# Patient Record
Sex: Male | Born: 1960 | Race: White | Hispanic: No | Marital: Married | Smoking: Former smoker
Health system: Southern US, Community
[De-identification: ages and names within clinical notes are randomized; demographics above are authoritative.]

## PROBLEM LIST (undated history)

## (undated) DIAGNOSIS — Z86010 Personal history of colonic polyps: Secondary | ICD-10-CM

## (undated) DIAGNOSIS — N50819 Testicular pain, unspecified: Secondary | ICD-10-CM

## (undated) DIAGNOSIS — K219 Gastro-esophageal reflux disease without esophagitis: Secondary | ICD-10-CM

## (undated) DIAGNOSIS — N503 Cyst of epididymis: Secondary | ICD-10-CM

## (undated) DIAGNOSIS — M7500 Adhesive capsulitis of unspecified shoulder: Secondary | ICD-10-CM

## (undated) DIAGNOSIS — Z9189 Other specified personal risk factors, not elsewhere classified: Secondary | ICD-10-CM

## (undated) DIAGNOSIS — R011 Cardiac murmur, unspecified: Secondary | ICD-10-CM

## (undated) DIAGNOSIS — Z9289 Personal history of other medical treatment: Secondary | ICD-10-CM

## (undated) DIAGNOSIS — I714 Abdominal aortic aneurysm, without rupture, unspecified: Secondary | ICD-10-CM

## (undated) DIAGNOSIS — R739 Hyperglycemia, unspecified: Secondary | ICD-10-CM

## (undated) DIAGNOSIS — N529 Male erectile dysfunction, unspecified: Secondary | ICD-10-CM

## (undated) DIAGNOSIS — T7840XA Allergy, unspecified, initial encounter: Secondary | ICD-10-CM

## (undated) DIAGNOSIS — I712 Thoracic aortic aneurysm, without rupture: Secondary | ICD-10-CM

## (undated) DIAGNOSIS — E291 Testicular hypofunction: Secondary | ICD-10-CM

## (undated) DIAGNOSIS — N200 Calculus of kidney: Secondary | ICD-10-CM

## (undated) DIAGNOSIS — E785 Hyperlipidemia, unspecified: Secondary | ICD-10-CM

## (undated) DIAGNOSIS — K625 Hemorrhage of anus and rectum: Secondary | ICD-10-CM

## (undated) DIAGNOSIS — I861 Scrotal varices: Secondary | ICD-10-CM

## (undated) HISTORY — DX: Testicular pain, unspecified: N50.819

## (undated) HISTORY — DX: Hyperglycemia, unspecified: R73.9

## (undated) HISTORY — PX: POLYPECTOMY: SHX149

## (undated) HISTORY — DX: Hyperlipidemia, unspecified: E78.5

## (undated) HISTORY — DX: Testicular hypofunction: E29.1

## (undated) HISTORY — DX: Hemorrhage of anus and rectum: K62.5

## (undated) HISTORY — DX: Personal history of colonic polyps: Z86.010

## (undated) HISTORY — DX: Thoracic aortic aneurysm, without rupture: I71.2

## (undated) HISTORY — DX: Scrotal varices: I86.1

## (undated) HISTORY — DX: Allergy, unspecified, initial encounter: T78.40XA

## (undated) HISTORY — DX: Adhesive capsulitis of unspecified shoulder: M75.00

## (undated) HISTORY — DX: Cardiac murmur, unspecified: R01.1

## (undated) HISTORY — DX: Male erectile dysfunction, unspecified: N52.9

## (undated) HISTORY — PX: COLONOSCOPY: SHX174

---

## 1992-06-25 HISTORY — PX: REFRACTIVE SURGERY: SHX103

## 2000-10-26 ENCOUNTER — Emergency Department (HOSPITAL_COMMUNITY): Admission: EM | Admit: 2000-10-26 | Discharge: 2000-10-26 | Payer: Self-pay | Admitting: Emergency Medicine

## 2001-11-25 ENCOUNTER — Ambulatory Visit (HOSPITAL_BASED_OUTPATIENT_CLINIC_OR_DEPARTMENT_OTHER): Admission: RE | Admit: 2001-11-25 | Discharge: 2001-11-25 | Payer: Self-pay | Admitting: General Surgery

## 2002-08-03 ENCOUNTER — Encounter: Payer: Self-pay | Admitting: Otolaryngology

## 2002-08-03 ENCOUNTER — Encounter: Admission: RE | Admit: 2002-08-03 | Discharge: 2002-08-03 | Payer: Self-pay | Admitting: Otolaryngology

## 2002-09-21 ENCOUNTER — Encounter (INDEPENDENT_AMBULATORY_CARE_PROVIDER_SITE_OTHER): Payer: Self-pay | Admitting: *Deleted

## 2002-09-21 ENCOUNTER — Ambulatory Visit (HOSPITAL_BASED_OUTPATIENT_CLINIC_OR_DEPARTMENT_OTHER): Admission: RE | Admit: 2002-09-21 | Discharge: 2002-09-21 | Payer: Self-pay | Admitting: Otolaryngology

## 2002-09-21 HISTORY — PX: NASAL SEPTUM SURGERY: SHX37

## 2002-11-20 ENCOUNTER — Encounter: Payer: Self-pay | Admitting: Family Medicine

## 2002-11-20 ENCOUNTER — Encounter: Admission: RE | Admit: 2002-11-20 | Discharge: 2002-11-20 | Payer: Self-pay | Admitting: Family Medicine

## 2002-12-01 ENCOUNTER — Encounter: Admission: RE | Admit: 2002-12-01 | Discharge: 2002-12-01 | Payer: Self-pay | Admitting: Family Medicine

## 2002-12-01 ENCOUNTER — Encounter: Payer: Self-pay | Admitting: Family Medicine

## 2004-03-14 ENCOUNTER — Ambulatory Visit: Admission: RE | Admit: 2004-03-14 | Discharge: 2004-03-14 | Payer: Self-pay | Admitting: Orthopedic Surgery

## 2005-12-17 ENCOUNTER — Encounter: Admission: RE | Admit: 2005-12-17 | Discharge: 2006-03-12 | Payer: Self-pay | Admitting: Chiropractic Medicine

## 2006-01-08 ENCOUNTER — Encounter: Admission: RE | Admit: 2006-01-08 | Discharge: 2006-01-08 | Payer: Self-pay | Admitting: Sports Medicine

## 2006-01-28 ENCOUNTER — Encounter: Admission: RE | Admit: 2006-01-28 | Discharge: 2006-01-28 | Payer: Self-pay | Admitting: Chiropractic Medicine

## 2006-02-27 ENCOUNTER — Encounter: Admission: RE | Admit: 2006-02-27 | Discharge: 2006-02-27 | Payer: Self-pay | Admitting: Sports Medicine

## 2006-11-11 ENCOUNTER — Encounter: Admission: RE | Admit: 2006-11-11 | Discharge: 2006-11-11 | Payer: Self-pay | Admitting: Neurology

## 2007-02-07 ENCOUNTER — Encounter: Payer: Self-pay | Admitting: Family Medicine

## 2009-02-09 ENCOUNTER — Encounter (INDEPENDENT_AMBULATORY_CARE_PROVIDER_SITE_OTHER): Payer: Self-pay | Admitting: *Deleted

## 2009-02-09 LAB — CONVERTED CEMR LAB
Hemoglobin: 14.2 g/dL
Testosterone, total: 2.2 ng/mL
Total CHOL/HDL Ratio: 6.64

## 2009-08-03 ENCOUNTER — Encounter (INDEPENDENT_AMBULATORY_CARE_PROVIDER_SITE_OTHER): Payer: Self-pay | Admitting: *Deleted

## 2009-08-03 LAB — CONVERTED CEMR LAB
ALT: 57 units/L
AST: 37 units/L
Alkaline Phosphatase: 85 units/L
HDL: 44 mg/dL
Testosterone, total: 2.6 ng/mL
Triglycerides: 251 mg/dL

## 2009-08-23 ENCOUNTER — Encounter (INDEPENDENT_AMBULATORY_CARE_PROVIDER_SITE_OTHER): Payer: Self-pay | Admitting: *Deleted

## 2009-08-23 LAB — CONVERTED CEMR LAB
MCV: 92.9 fL
PSA: 0.4 ng/mL

## 2010-02-13 ENCOUNTER — Ambulatory Visit: Payer: Self-pay | Admitting: Family Medicine

## 2010-02-13 ENCOUNTER — Encounter (INDEPENDENT_AMBULATORY_CARE_PROVIDER_SITE_OTHER): Payer: Self-pay | Admitting: *Deleted

## 2010-02-13 ENCOUNTER — Encounter: Payer: Self-pay | Admitting: Family Medicine

## 2010-02-13 DIAGNOSIS — Z9189 Other specified personal risk factors, not elsewhere classified: Secondary | ICD-10-CM | POA: Insufficient documentation

## 2010-02-13 DIAGNOSIS — R51 Headache: Secondary | ICD-10-CM | POA: Insufficient documentation

## 2010-02-13 DIAGNOSIS — Z8679 Personal history of other diseases of the circulatory system: Secondary | ICD-10-CM | POA: Insufficient documentation

## 2010-02-13 DIAGNOSIS — R519 Headache, unspecified: Secondary | ICD-10-CM | POA: Insufficient documentation

## 2010-02-13 DIAGNOSIS — M722 Plantar fascial fibromatosis: Secondary | ICD-10-CM | POA: Insufficient documentation

## 2010-02-13 DIAGNOSIS — E785 Hyperlipidemia, unspecified: Secondary | ICD-10-CM | POA: Insufficient documentation

## 2010-02-13 DIAGNOSIS — E291 Testicular hypofunction: Secondary | ICD-10-CM | POA: Insufficient documentation

## 2010-02-17 ENCOUNTER — Encounter (INDEPENDENT_AMBULATORY_CARE_PROVIDER_SITE_OTHER): Payer: Self-pay | Admitting: *Deleted

## 2010-02-17 DIAGNOSIS — I861 Scrotal varices: Secondary | ICD-10-CM

## 2010-02-17 DIAGNOSIS — F528 Other sexual dysfunction not due to a substance or known physiological condition: Secondary | ICD-10-CM | POA: Insufficient documentation

## 2010-02-17 HISTORY — DX: Scrotal varices: I86.1

## 2010-02-23 ENCOUNTER — Ambulatory Visit: Payer: Self-pay | Admitting: Family Medicine

## 2010-03-02 ENCOUNTER — Telehealth: Payer: Self-pay | Admitting: Family Medicine

## 2010-03-02 LAB — CONVERTED CEMR LAB
Albumin: 4.5 g/dL (ref 3.5–5.2)
Basophils Absolute: 0.1 10*3/uL (ref 0.0–0.1)
CO2: 27 meq/L (ref 19–32)
Calcium: 9.5 mg/dL (ref 8.4–10.5)
Chloride: 106 meq/L (ref 96–112)
Creatinine, Ser: 0.9 mg/dL (ref 0.4–1.5)
Eosinophils Absolute: 0.2 10*3/uL (ref 0.0–0.7)
Glucose, Bld: 99 mg/dL (ref 70–99)
HCT: 42.9 % (ref 39.0–52.0)
Hemoglobin: 14.6 g/dL (ref 13.0–17.0)
LDL Cholesterol: 98 mg/dL (ref 0–99)
Monocytes Absolute: 0.6 10*3/uL (ref 0.1–1.0)
Monocytes Relative: 7.5 % (ref 3.0–12.0)
PSA: 0.31 ng/mL (ref 0.10–4.00)
Total Bilirubin: 0.6 mg/dL (ref 0.3–1.2)
Total Protein: 7 g/dL (ref 6.0–8.3)
Triglycerides: 131 mg/dL (ref 0.0–149.0)
VLDL: 26.2 mg/dL (ref 0.0–40.0)

## 2010-03-13 ENCOUNTER — Ambulatory Visit (HOSPITAL_BASED_OUTPATIENT_CLINIC_OR_DEPARTMENT_OTHER): Admission: RE | Admit: 2010-03-13 | Discharge: 2010-03-13 | Payer: Self-pay | Admitting: General Surgery

## 2010-03-13 HISTORY — PX: OTHER SURGICAL HISTORY: SHX169

## 2010-03-14 ENCOUNTER — Telehealth: Payer: Self-pay | Admitting: Family Medicine

## 2010-03-16 ENCOUNTER — Encounter (INDEPENDENT_AMBULATORY_CARE_PROVIDER_SITE_OTHER): Payer: Self-pay | Admitting: *Deleted

## 2010-03-20 ENCOUNTER — Ambulatory Visit: Payer: Self-pay | Admitting: Gastroenterology

## 2010-04-03 ENCOUNTER — Ambulatory Visit: Payer: Self-pay | Admitting: Gastroenterology

## 2010-04-03 LAB — HM COLONOSCOPY

## 2010-04-05 ENCOUNTER — Encounter: Payer: Self-pay | Admitting: Gastroenterology

## 2010-04-10 ENCOUNTER — Encounter: Payer: Self-pay | Admitting: Family Medicine

## 2010-05-05 ENCOUNTER — Emergency Department (HOSPITAL_COMMUNITY): Admission: EM | Admit: 2010-05-05 | Discharge: 2010-05-05 | Payer: Self-pay | Admitting: Emergency Medicine

## 2010-05-08 ENCOUNTER — Ambulatory Visit: Payer: Self-pay | Admitting: Family Medicine

## 2010-05-08 DIAGNOSIS — R42 Dizziness and giddiness: Secondary | ICD-10-CM | POA: Insufficient documentation

## 2010-06-16 ENCOUNTER — Encounter: Payer: Self-pay | Admitting: Family Medicine

## 2010-07-25 NOTE — Progress Notes (Signed)
Summary: Rx's Testosterone, needles and syringes  Phone Note Outgoing Call Call back at [phone]   Call placed by: Sydell Axon, LPN Call placed to: [facility] Summary of Call: Called rx's into pharmacy as instructed. Was informed by pharmacist  Trinna Post)  that patient gets a 3 ml syringe and the last time that he got needles he got the 23 gauge.  Advised pharmacist to fill as patient has been getting in the past. Initial call taken by: Sydell Axon LPN,  March 02, 2010 10:49 AM  Follow-up for Phone Call        I agree, please update the med list this.  I appreciate the help Follow-up by: Crawford Givens MD,  March 02, 2010 6:01 PM    New/Updated Medications: MONOJECT SYRINGE 3 ML MISC (SYRINGE (DISPOSABLE)) Use every 4 weeks or as directed BD HYPODERMIC NEEDLE 23G X 1" MISC (NEEDLE (DISP)) Use every 4 weeks or as directed Prescriptions: BD HYPODERMIC NEEDLE 23G X 1" MISC (NEEDLE (DISP)) Use every 4 weeks or as directed  #10 x 1   Entered by:   Sydell Axon LPN   Authorized by:   Crawford Givens MD   Signed by:   Sydell Axon LPN on 91/47/8295   Method used:   Telephoned to ...       [facility]  [address] [id number]       [address]       [place], [place]  [address]       Ph: [phone] or [phone]       Fax: [phone]   RxID:   [id number] MONOJECT SYRINGE 3 ML MISC (SYRINGE (DISPOSABLE)) Use every 4 weeks or as directed  #10 x 1   Entered by:   Sydell Axon LPN   Authorized by:   Crawford Givens MD   Signed by:   Sydell Axon LPN on 29/51/8841   Method used:   Telephoned to ...       [facility]  [address] [id number]       [address]       [place], [place]  [address]       Ph: [phone] or [phone]       Fax: [phone]   RxID:   [id number] DEPO-TESTOSTERONE 200 MG/ML OIL (TESTOSTERONE CYPIONATE) 1 mL q 4 weeks  #10 ml x 1   Entered by:   Sydell Axon LPN   Authorized by:   Crawford Givens MD   Signed  by:   Sydell Axon LPN on 54/62/7035   Method used:   Telephoned to ...       [facility]  [address] [id number]       [address]       [place], [place]  [address]       Ph: [phone] or [phone]       Fax: [phone]   RxID:   [id number]

## 2010-07-25 NOTE — Letter (Signed)
Summary: [facility]  [facility]   Imported By: Lanelle Bal 02/28/2010 09:26:15  _____________________________________________________________________  External Attachment:    Type:   Image     Comment:   External Document

## 2010-07-25 NOTE — Letter (Signed)
Summary: Previsit letter  [facility]  [address] [place], [place] [address]   Phone: [phone]  Fax: [phone]       02/13/2010 MRN: [id number]  Steven [name] [address] [place], [place]  [address]  Dear [name],  Welcome to the Gastroenterology Division at [facility].    You are scheduled to see a nurse for your pre-procedure visit on 03-20-2010 at 8:00am on the 3rd floor at [facility], [address]  We ask that you try to arrive at our office 15 minutes prior to your appointment time to allow for check-in.  Your nurse visit will consist of discussing your medical and surgical history, your immediate family medical history, and your medications.    Please bring a complete list of all your medications or, if you prefer, bring the medication bottles and we will list them.  We will need to be aware of both prescribed and over the counter drugs.  We will need to know exact dosage information as well.  If you are on blood thinners (Coumadin, Plavix, Aggrenox, Ticlid, etc.) please call our office today/prior to your appointment, as we need to consult with your physician about holding your medication.   Please be prepared to read and sign documents such as consent forms, a financial agreement, and acknowledgement forms.  If necessary, and with your consent, a friend or relative is welcome to sit-in on the nurse visit with you.  Please bring your insurance card so that we may make a copy of it.  If your insurance requires a referral to see a specialist, please bring your referral form from your primary care physician.  No co-pay is required for this nurse visit.     If you cannot keep your appointment, please call [phone] to cancel or reschedule prior to your appointment date.  This allows [place] the opportunity to schedule an appointment for another patient in need of care.    Thank you for choosing [facility] for your  medical needs.  We appreciate the opportunity to care for you.  Please visit [place] at our website  to learn more about our practice.                     Sincerely.                                                                                                                   The Gastroenterology Division

## 2010-07-25 NOTE — Letter (Signed)
Summary: [name] Instructions  [facility]  [address] [place], [place] [address]   Phone: [phone]  Fax: [phone]       Steven [name]    07-Dec-1960    MRN: [id number]        Procedure Day /Date:  04/03/10 Monday     Arrival Time:  9:00 am     Procedure Time: 10:00 am     Location of Procedure:                    _ x_  [facility] (4th Floor)                        PREPARATION FOR COLONOSCOPY WITH MOVIPREP   Starting 5 days prior to your procedure _10/5/11 _ do not eat nuts, seeds, popcorn, corn, beans, peas,  salads, or any raw vegetables.  Do not take any fiber supplements (e.g. Metamucil, Citrucel, and Benefiber).  THE DAY BEFORE YOUR PROCEDURE         DATE:  04/02/10   DAY:  Sunday  1.  Drink clear liquids the entire day-NO SOLID FOOD  2.  Do not drink anything colored red or purple.  Avoid juices with pulp.  No orange juice.  3.  Drink at least 64 oz. (8 glasses) of fluid/clear liquids during the day to prevent dehydration and help the prep work efficiently.  CLEAR LIQUIDS INCLUDE: Water Jello Ice Popsicles Tea (sugar ok, no milk/cream) Powdered fruit flavored drinks Coffee (sugar ok, no milk/cream) Gatorade Juice: apple, white grape, white cranberry  Lemonade Clear bullion, consomm, broth Carbonated beverages (any kind) Strained chicken noodle soup Hard Candy                             4.  In the morning, mix first dose of MoviPrep solution:    Empty 1 Pouch A and 1 Pouch B into the disposable container    Add lukewarm drinking water to the top line of the container. Mix to dissolve    Refrigerate (mixed solution should be used within 24 hrs)  5.  Begin drinking the prep at 5:00 p.m. The MoviPrep container is divided by 4 marks.   Every 15 minutes drink the solution down to the next mark (approximately 8 oz) until the full liter is complete.   6.  Follow completed prep with 16 oz of clear liquid of your  choice (Nothing red or purple).  Continue to drink clear liquids until bedtime.  7.  Before going to bed, mix second dose of MoviPrep solution:    Empty 1 Pouch A and 1 Pouch B into the disposable container    Add lukewarm drinking water to the top line of the container. Mix to dissolve    Refrigerate  THE DAY OF YOUR PROCEDURE      DATE:   04/03/10 DAY:  Monday  Beginning at  5:00 a.m. (5 hours before procedure):         1. Every 15 minutes, drink the solution down to the next mark (approx 8 oz) until the full liter is complete.  2. Follow completed prep with 16 oz. of clear liquid of your choice.    3. You may drink clear liquids until  8:00am  (2 HOURS BEFORE PROCEDURE).   MEDICATION INSTRUCTIONS  Unless otherwise instructed, you should take regular prescription medications with a small sip  of water   as early as possible the morning of your procedure.           OTHER INSTRUCTIONS  You will need a responsible adult at least 51 years of age to accompany you and drive you home.   This person must remain in the waiting room during your procedure.  Wear loose fitting clothing that is easily removed.  Leave jewelry and other valuables at home.  However, you may wish to bring a book to read or  an iPod/MP3 player to listen to music as you wait for your procedure to start.  Remove all body piercing jewelry and leave at home.  Total time from sign-in until discharge is approximately 2-3 hours.  You should go home directly after your procedure and rest.  You can resume normal activities the  day after your procedure.  The day of your procedure you should not:   Drive   Make legal decisions   Operate machinery   Drink alcohol   Return to work  You will receive specific instructions about eating, activities and medications before you leave.    The above instructions have been reviewed and explained to me by   Karl Bales RN  March 20, 2010 8:04  AM    I fully understand and can verbalize these instructions _____________________________ Date _________

## 2010-07-25 NOTE — Assessment & Plan Note (Signed)
Summary: cpx/[facility]   Vital Signs:  Patient profile:   50 year old male Height:      74 inches Weight:      242.75 pounds BMI:     31.28 Temp:     97.8 degrees F oral Pulse rate:   80 / minute Pulse rhythm:   regular BP sitting:   110 / 64  (left arm) Cuff size:   large  Vitals Entered By: Delilah Shan CMA Bristyn Kulesza Dull) (February 13, 2010 11:36 AM) CC: CPX - Transfer from [place]   History of Present Illness: CPE- See prev med.  H/o sebaceous cysts on back.  Has appointment with [facility] today for eval and tx.   R>L foot pain, at the heel, starts after getting out of bed. Better after being up and weightbearing for a period of time.   Elevated Cholesterol: Using medications without problems:yes Muscle aches: no Other complaints: no   H/o low T noted on labs.  Also with h/o ED and fatigue.  Fatigue has improved since started testosterone but ED not resolved.  Next dose of testosterone due 02/27/10.  That would be 6th dose total.  Will be due for labs soon.  Preventive Screening-Counseling & Management  Alcohol-Tobacco     Smoking Status: quit  Caffeine-Diet-Exercise     Does Patient Exercise: yes      Drug Use:  no.    Allergies (verified): 1)  ! Penicillin 2)  ! Amoxicillin 3)  ! 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 Family History Diabetes 1st degree relative, other blood relative Family History Kidney disease, parents M alive, treated renal cancer 11-13-1991 F dead, parkinson's disease/alzheimer's  Social History: Reviewed history and no changes required. 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  Smoking Status:  quit Drug Use:  no Does Patient Exercise:  yes  Review of Systems       See HPI.  Otherwise negative.    Physical Exam  General:  GEN: nad, alert and oriented HEENT: mucous membranes moist NECK: supple w/o LA CV: rrr.  no murmur PULM: ctab, no inc wob ABD: soft, +bs EXT: no edema SKIN: 3 sebaceous cysts on back, 1 irriated (it has prev drained)  R ankle wnl but foot minimally tender to palpation at origin on plantar fascia.  O/w NV intact.  Rectal:  No external abnormalities noted. Normal sphincter tone. No rectal masses or tenderness. Prostate:  Prostate gland firm and smooth, minimal enlargement, no nodularity, tenderness, mass, asymmetry or induration.   Impression & Recommendations:  Problem # 1:  Preventive Health Care (ICD-V70.0) See prev med.  Td should be up to date based on patient recollection.  Records to be scanned.  Problem # 2:  HYPERLIPIDEMIA (ICD-272.4) No change in meds. Return for labs. His updated medication list for this problem includes:    Lipitor 20 Mg Tabs (Atorvastatin calcium) .Marland Kitchen... Take 1 tablet by mouth once a day  Problem # 3:  HYPOGONADISM (ICD-257.2) Return for labs per instructions.   Problem # 4:  PLANTAR FASCIITIS (ICD-728.71) D/w patient [id number] and OTC inserts for arch.  Fu as needed.   Complete Medication List: 1)  Lipitor 20 Mg Tabs  (Atorvastatin calcium) .... Take 1 tablet by mouth once a day 2)  Aspirin 81 Mg Tabs (Aspirin) .... Take 1 tablet by mouth once a day 3)  Depo-testosterone 200 Mg/ml Oil (Testosterone cypionate) .Marland Kitchen.. 1 ml q 4 weeks  Other Orders: Gastroenterology Referral (GI)  Patient Instructions: 1)  I want you to come back for fasting AM labs on 9/1 or 9/2.   2)  cmet   272.0 3)  lipid    272.0 4)  testosterone   257.2 5)  cbc   257.2 6)  PSA   257.2 7)  We'll contact you with your lab report.  8)  See Shirlee Limerick about your referral before your leave today.   9)  Follow up with [facility] today about your back.   Current Allergies (reviewed today): ! PENICILLIN ! AMOXICILLIN ! ERYTHROMYCIN   Prevention & Chronic Care Immunizations   Influenza vaccine: Not documented   Influenza vaccine due: 02/23/2010    Tetanus booster: Not documented    Pneumococcal vaccine: Not documented  Colorectal Screening   Hemoccult: Not documented    Colonoscopy: Not documented  Other Screening   PSA: Not documented   PSA action/deferral: Discussed-PSA requested  (02/13/2010)   Smoking status: quit  (02/13/2010)  Lipids   Total Cholesterol: Not documented   LDL: Not documented   LDL Direct: Not documented   HDL: Not documented   Triglycerides: Not documented    SGOT (AST): Not documented   SGPT (ALT): Not documented   Alkaline phosphatase: Not documented   Total bilirubin: Not documented    Lipid flowsheet reviewed?: Yes  Self-Management Support :    Lipid self-management support: Not documented    Appended Document: cpx/transfer from [facility] Stool heme neg today.

## 2010-07-25 NOTE — Letter (Signed)
Summary: Results Letter  [facility]  [address] [place], [place] [address]   Phone: [phone]  Fax: [phone]        April 05, 2010 MRN: [id number]    Steven [name] [address] [place], [place]  [address]    Dear Mr. Dingus,   Three of the polyps removed during your recent procedure were proven to be adenomatous.  These are pre-cancerous polyps that may have grown into cancers if they had not been removed.  Based on current nationally recognized surveillance guidelines, I recommend that you have a repeat colonoscopy in 3 years.   We will therefore put your information in our reminder system and will contact you in 3 years to schedule a repeat procedure.  Please call if you have any questions or concerns.       Sincerely,  Rachael Fee MD  This letter has been electronically signed by your physician.  Appended Document: Results Letter letter mailed

## 2010-07-25 NOTE — Miscellaneous (Signed)
  Clinical Lists Changes  Observations: Added new observation of TD BOOSTER: Td (02/24/2004 14:22)      Immunization History:  Tetanus/Td Immunization History:    Tetanus/Td:  td (02/24/2004)

## 2010-07-25 NOTE — Assessment & Plan Note (Signed)
Summary: ?SINUS INFECTION/CLE   Vital Signs:  Patient profile:   50 year old male Height:      74 inches Weight:      250.75 pounds BMI:     32.31 Temp:     98.6 degrees F oral Pulse rate:   112 / minute Pulse rhythm:   regular BP sitting:   130 / 74  (left arm) Cuff size:   large  Vitals Entered By: Delilah Shan CMA Duncan Dull) (May 08, 2010 2:29 PM) CC: ? sinus infection   History of Present Illness: Thursday night, Friday AM woke up dizzy.  Positional changes on Friday AM.  Went to ER wtih vertigo and nausea.  Had some tingling in L arm.  Meclizine helps some with nausea but not with the vertigo.  MRI records reviewed with patient along with ER recs.  Still with some mild vertigo, positional, improving.  Still with some occ sweats.  Otherwise feeling well.    Likely motion/artifact on MRI.  Even If he did have disease in MCA vessels (which is unlikely given that other vessels wnl), goal would be further prevention with statin, controlled BP, not smoking, all of which have already been done.  On ASA and no h/o DM2.  D/w patient and he understood.   Allergies: 1)  ! Penicillin 2)  ! Amoxicillin 3)  ! Erythromycin  Review of Systems       See HPI.  Otherwise negative.    Physical Exam  General:  GEN: nad, alert and oriented HEENT: mucous membranes moist, TM w/o erythema, nasal epithelium injected, OP with cobblestoning, no exudates, PERRL, EOMI NECK: supple w/o LA CV: rrr. murmur noted, no change from prev PULM: ctab, no inc wob ABD: soft, +bs EXT: no edema CN 2-12 wnl B, S/S/DTR wnl x4, mild vertigo symptoms with rapid head turning but not with eye tracking    Impression & Recommendations:  Problem # 1:  INTERMITTENT VERTIGO (ICD-780.4) LIkey related to ongoing URI, likely viral.  Vertigo is improving per patient.  I would just observe.  If symptoms persist, patient to notify me.  If vertigo persists without other symptoms, consider neuro referral.  I expect this to  improve on its own. Nontoxic and okay for outpatient follow up.   Complete Medication List: 1)  Lipitor 20 Mg Tabs (Atorvastatin calcium) .... Take 1 tablet by mouth once a day 2)  Aspirin 81 Mg Tabs (Aspirin) .... Take 1 tablet by mouth once a day 3)  Depo-testosterone 200 Mg/ml Oil (Testosterone cypionate) .... 1.25 ml q 4 weeks 4)  Monoject Syringe 3 Ml Misc (Syringe (disposable)) .... Use every 4 weeks or as directed 5)  Bd Hypodermic Needle 23g X 1" Misc (Needle (disp)) .... Use every 4 weeks or as directed  Patient Instructions: 1)  I think your vertigo is due to a viral infection.  I would rest for the next few days and it should get better on its own.  If you aren't improving, let me know.  2)  Call back to set up your lab visit for the testosterone in late March or early April 2012.  Take care.    Orders Added: 1)  Est. Patient Level III [[address]]    Current Allergies (reviewed today): ! PENICILLIN ! AMOXICILLIN ! ERYTHROMYCIN

## 2010-07-25 NOTE — Progress Notes (Signed)
Summary: prior Berkley Harvey is needed for testosterone  Phone Note From Pharmacy   [facility]  [address] [id number] [employer] Summary of Call: Prior Berkley Harvey is needed for testosterone, form is on your desk. Initial call taken by: Lowella Petties CMA,  March 14, 2010 4:34 PM  Follow-up for Phone Call        signed.  Follow-up by: Crawford Givens MD,  March 15, 2010 12:07 PM  Additional Follow-up for Phone Call Additional follow up Details #1::        Form faxed.  Lowella Petties CMA  March 15, 2010 2:44 PM

## 2010-07-25 NOTE — Miscellaneous (Signed)
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   Children:  3, ages 1, 57 and 70. 1 daughter, alive - ? POTTS - monitors salt intake.  Recently diagnosed with Croh's Disease   2 step-daughters (02/17/2010 16:11) Added new observation of FAMILY HX: Family History Diabetes 1st degree relative, other blood relative Family History Kidney disease, parents M alive, treated renal cancer 1991/12/01 F dead, parkinson's disease/alzheimer's Brother 1, Alive, diabetes Brother 2, Alive Brother 3, Alive Sister 1, Alive (02/17/2010 16:11) Added new observation of PAST SURG HX: 11/30/2001  Deviated septum 12-01-03  Sinus surgery, (outpatient) LASIK surgery (02/17/2010 16:11) Added new observation of PAST MED HX: Lower testostone late 30-Nov-2008 (on therapy since  ~4/11) 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 Wanda Plump with URO for benign nodularity in left hemiscrotum 01-Dec-2006) felt to be a variant of normal. Previously evaluated for murmur, mitral valve prolapse - No prophylaxis ABX needed per MD eval.  (02/17/2010 16:11) Added new observation of MCV: 92.9 fL (08/23/2009 16:11) Added new  observation of HCT: 42.5 % (08/23/2009 16:11) Added new observation of TSH: 0.46 microintl units/mL (08/23/2009 16:11) Added new observation of PSA: 0.40 ng/mL (08/23/2009 16:11) Added new observation of SGPT (ALT): 57 units/L (08/03/2009 16:11) Added new observation of SGOT (AST): 37 units/L (08/03/2009 16:11) Added new observation of ALK PHOS: 85 units/L (08/03/2009 16:11) Added new observation of LDL DIR: 109 mg/dL (04/54/0981 19:14) Added new observation of HDL: 44 mg/dL (78/29/5621 30:86) Added new observation of TRIGLYC TOT: 251 mg/dL (57/84/6962 95:28) Added new observation of CHOLESTEROL: 183 mg/dL (41/32/4401 02:72) Added new observation of TESTOSTERTOT: 2.60 ng/mL (08/03/2009 16:11) Added new observation of TESTOSTERTOT: 2.20 ng/mL (02/09/2009 16:11) Added new observation of HGB: 14.2 g/dL (53/66/4403 47:42) Added new observation of TSH: 0.59 microintl units/mL (02/09/2009 16:11) Added new observation of PSA: 0.44 ng/mL (02/09/2009 16:11) Added new observation of BG RANDOM: 91 mg/dL (59/56/3875 64:33) Added new observation of LDL DIR: 168 mg/dL (29/51/8841 66:06) Added new observation of CHOL/HDL: 6.64  (02/09/2009 16:11) Added new observation of HDL: 42 mg/dL (30/16/0109 32:35) Added new observation of TRIGLYC TOT: 398 mg/dL (57/32/2025 42:70) Added new observation of CHOLESTEROL: 279 mg/dL (62/37/6283 15:17)      Past History:  Past Medical History: Lower testostone late 11-30-2008 (on therapy since  ~4/11) 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 Wanda Plump with URO for benign nodularity in left hemiscrotum Dec 01, 2006) felt to be a variant of normal. Previously evaluated for murmur, mitral valve prolapse - No prophylaxis ABX needed per MD eval.   Past Surgical History: November 30, 2001  Deviated septum 2003/12/01  Sinus surgery, (outpatient) LASIK  surgery   Family History: Family History Diabetes 1st degree relative, other blood relative Family History Kidney disease, parents M alive, treated renal cancer Nov 30, 1991 F dead, parkinson's disease/alzheimer's Brother 1, Alive, diabetes Brother 2, Alive Brother 3, Alive Sister 1, Alive  Social History: Former Smoker, quit 10/29/2008 Alcohol use-yes, rare Drug use-no Regular exercise-yes Married, since 11-30-1986, second marriage Occupation:  IT trainer, [employer], [employer], 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   Children:  3, ages 44, 60 and 57. 1 daughter, alive - ? POTTS - monitors salt intake.  Recently diagnosed with Croh's Disease   2 step-daughters

## 2010-07-25 NOTE — Miscellaneous (Signed)
Summary: [facility] previsit  Clinical Lists Changes  Medications: Added new medication of MOVIPREP 100 GM  SOLR (PEG-KCL-NACL-NASULF-NA ASC-C) As per prep instructions. - Signed Rx of MOVIPREP 100 GM  SOLR (PEG-KCL-NACL-NASULF-NA ASC-C) As per prep instructions.;  #1 x 0;  Signed;  Entered by: Karl Bales RN;  Authorized by: Rachael Fee MD;  Method used: [facility]  [address] [id number] [address], [place], [place]  [address], Ph: [phone] or [phone], Fax: [phone]    Prescriptions: MOVIPREP 100 GM  SOLR (PEG-KCL-NACL-NASULF-NA ASC-C) As per prep instructions.  #1 x 0   Entered by:   Karl Bales RN   Authorized by:   Rachael Fee MD   Signed by:   Karl Bales RN on 03/20/2010   Method used:   [facility] to        [facility]  [address] [id number] ([facility]       [address]       [place], [place]  [address]       Ph: [phone] or [phone]       Fax: [phone]   RxID:   [id number]

## 2010-07-25 NOTE — Procedures (Signed)
Summary: Colonoscopy  Patient: Steven [name] Note: All result statuses are Final unless otherwise noted.  Tests: (1) Colonoscopy (COL)   COL Colonoscopy           DONE     [facility]     [address]     [place], [place]  [address]           COLONOSCOPY PROCEDURE REPORT           PATIENT:  Steven [name], Steven [name]  MR#:  [id number]     BIRTHDATE:  10-09-1960, 49 yrs. old  GENDER:  male     ENDOSCOPIST:  Rachael Fee, MD     REF. BY:  Crawford Givens, M.D.     PROCEDURE DATE:  04/03/2010     PROCEDURE:  Colonoscopy with snare polypectomy     ASA CLASS:  Class II     INDICATIONS:  Routine Risk Screening     MEDICATIONS:   Fentanyl 75 mcg IV, Versed 10 mg IV           DESCRIPTION OF PROCEDURE:   After the risks benefits and     alternatives of the procedure were thoroughly explained, informed     consent was obtained.  Digital rectal exam was performed and     revealed no rectal masses.   The  endoscope was introduced through     the anus and advanced to the cecum, which was identified by both     the appendix and ileocecal valve, without limitations.  The     quality of the prep was good, using MoviPrep.  The instrument was     then slowly withdrawn as the colon was fully examined.     <<PROCEDUREIMAGES>>           FINDINGS:  Five sessile polyps were removed with cold snare, all     were sent to pathology. These ranged in size from 2mm to 6mm, were     located in ascending, sigmoid and rectum (see image4, image5, and     image7).  Mild diverticulosis was found in the sigmoid to     descending colon segments (see image6).  This was otherwise a     normal examination of the colon (see image8, image2, and image3).     Retroflexed views in the rectum revealed no abnormalities.    The     scope was then withdrawn from the patient and the procedure     completed.           COMPLICATIONS:  None           ENDOSCOPIC IMPRESSION:     1) Five subcentimeter polyps; all were  removed and all were sent     to pathology     2) Mild diverticulosis in the sigmoid to descending colon     segments     3) Otherwise normal examination           RECOMMENDATIONS:     1) If the polyps removed today are proven to be adenomatous     (pre-cancerous) polyps, you will need a colonoscopy in 3-5 years.     Otherwise you should continue to follow colorectal cancer     screening guidelines for "routine risk" patients with a     colonoscopy in 10 years.     2) You will receive a letter within 1-2 weeks with the results     of your biopsy as well as final  recommendations. Please call my     office if you have not received a letter after 3 weeks.           ______________________________     Rachael Fee, MD           n.     eSIGNED:   Rachael Fee at 04/03/2010 10:43 AM           Diemer, Chrissie Noa, [phone]  Note: An exclamation mark (!) indicates a result that was not dispersed into the flowsheet. Document Creation Date: 04/03/2010 10:44 AM _______________________________________________________________________  (1) Order result status: Final Collection or observation date-time: 04/03/2010 10:38 Requested date-time:  Receipt date-time:  Reported date-time:  Referring Physician:   Ordering Physician: Rob Bunting [phone]) Specimen Source:  Source: Launa Grill Order Number: [id number] Lab site:   Appended Document: Colonoscopy     Procedures Next Due Date:    Colonoscopy: 03/2013

## 2010-07-27 NOTE — Letter (Signed)
Summary: [facility]  [facility]   Imported By: Lester  06/27/2010 10:53:09  _____________________________________________________________________  External Attachment:    Type:   Image     Comment:   External Document  Appended Document: [facility]    Clinical Lists Changes  Observations: Added new observation of PAST SURG HX: 2003  Deviated septum 2005  Sinus surgery, (outpatient) LASIK surgery 2011 unremarkable follow up scrotal ultrasound per Uro (06/28/2010 21:49)       Past History:  Past Surgical History: 2003  Deviated septum 2005  Sinus surgery, (outpatient) LASIK surgery 2011 unremarkable follow up scrotal ultrasound per Uro

## 2010-07-28 NOTE — Letter (Signed)
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 Wanda Plump with URO for benign nodularity in left hemiscrotum (2008) felt to be a variant of normal. Previously evaluated for murmur, mitral valve prolapse - No prophylaxis ABX needed per MD eval.  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 Wanda Plump with URO for benign nodularity in left hemiscrotum (2008) felt to be  a variant of normal. Previously evaluated for murmur, mitral valve prolapse - No prophylaxis ABX needed per MD eval.

## 2010-09-05 ENCOUNTER — Ambulatory Visit (INDEPENDENT_AMBULATORY_CARE_PROVIDER_SITE_OTHER): Payer: BC Managed Care – PPO | Admitting: Family Medicine

## 2010-09-05 ENCOUNTER — Encounter: Payer: Self-pay | Admitting: Family Medicine

## 2010-09-05 ENCOUNTER — Other Ambulatory Visit: Payer: Self-pay | Admitting: Family Medicine

## 2010-09-05 DIAGNOSIS — K1370 Unspecified lesions of oral mucosa: Secondary | ICD-10-CM | POA: Insufficient documentation

## 2010-09-05 DIAGNOSIS — R1012 Left upper quadrant pain: Secondary | ICD-10-CM

## 2010-09-05 LAB — CBC
HCT: 42.1 % (ref 39.0–52.0)
Hemoglobin: 14.6 g/dL (ref 13.0–17.0)
MCH: 30.6 pg (ref 26.0–34.0)
MCHC: 34.7 g/dL (ref 30.0–36.0)
MCV: 88.3 fL (ref 78.0–100.0)
Platelets: 225 K/uL (ref 150–400)
RBC: 4.77 MIL/uL (ref 4.22–5.81)
RDW: 12.8 % (ref 11.5–15.5)
WBC: 8.2 K/uL (ref 4.0–10.5)

## 2010-09-05 LAB — POCT CARDIAC MARKERS
CKMB, poc: <1 ng/mL — ABNORMAL LOW (ref 1.0–8.0)
Myoglobin, poc: 57.4 ng/mL (ref 12–200)
Troponin i, poc: <0.05 ng/mL (ref 0.00–0.09)

## 2010-09-05 LAB — COMPREHENSIVE METABOLIC PANEL
ALT: 37 U/L (ref 0–53)
Albumin: 4.3 g/dL (ref 3.5–5.2)
Alkaline Phosphatase: 71 U/L (ref 39–117)
BUN: 8 mg/dL (ref 6–23)
Creatinine, Ser: 0.8 mg/dL (ref 0.4–1.5)
GFR calc Af Amer: >60 mL/min (ref >60)
Glucose, Bld: 107 mg/dL — ABNORMAL HIGH (ref 70–99)
Potassium: 4 mEq/L (ref 3.5–5.1)
Sodium: 139 mEq/L (ref 135–145)
Total Bilirubin: 0.8 mg/dL (ref 0.3–1.2)
Total Protein: 6.9 g/dL (ref 6.0–8.3)

## 2010-09-05 LAB — HEMOGLOBIN A1C: Mean Plasma Glucose: 123 mg/dL — ABNORMAL HIGH (ref <117)

## 2010-09-06 LAB — CBC WITH DIFFERENTIAL/PLATELET
Basophils Absolute: 0.1 10*3/uL (ref 0.0–0.1)
Lymphocytes Relative: 37.3 % (ref 12.0–46.0)
Lymphs Abs: 3.7 10*3/uL (ref 0.7–4.0)
MCHC: 34.3 g/dL (ref 30.0–36.0)
MCV: 93.2 fl (ref 78.0–100.0)
Monocytes Absolute: 0.8 10*3/uL (ref 0.1–1.0)
Platelets: 247 10*3/uL (ref 150.0–400.0)
WBC: 10.1 10*3/uL (ref 4.5–10.5)

## 2010-09-06 LAB — HEPATIC FUNCTION PANEL
ALT: 42 U/L (ref 0–53)
AST: 31 U/L (ref 0–37)
Albumin: 4.7 g/dL (ref 3.5–5.2)
Total Protein: 7.2 g/dL (ref 6.0–8.3)

## 2010-09-06 LAB — BASIC METABOLIC PANEL
CO2: 27 mEq/L (ref 19–32)
Calcium: 9.5 mg/dL (ref 8.4–10.5)
Chloride: 105 mEq/L (ref 96–112)
Creatinine, Ser: 0.9 mg/dL (ref 0.4–1.5)
GFR: 92.13 mL/min (ref >60.00)
Sodium: 139 mEq/L (ref 135–145)

## 2010-09-06 LAB — LIPASE: Lipase: 22 U/L (ref 11.0–59.0)

## 2010-09-07 LAB — CBC
Hemoglobin: 14.7 g/dL (ref 13.0–17.0)
MCHC: 34.3 g/dL (ref 30.0–36.0)
MCV: 90.3 fL (ref 78.0–100.0)
Platelets: 206 10*3/uL (ref 150–400)
RBC: 4.75 MIL/uL (ref 4.22–5.81)
RDW: 13 % (ref 11.5–15.5)

## 2010-09-07 LAB — DIFFERENTIAL
Eosinophils Absolute: 0.2 10*3/uL (ref 0.0–0.7)
Lymphocytes Relative: 42 % (ref 12–46)
Monocytes Absolute: 0.5 10*3/uL (ref 0.1–1.0)
Monocytes Relative: 6 % (ref 3–12)
Neutrophils Relative %: 49 % (ref 43–77)

## 2010-09-07 LAB — BASIC METABOLIC PANEL
Calcium: 9.2 mg/dL (ref 8.4–10.5)
Glucose, Bld: 161 mg/dL — ABNORMAL HIGH (ref 70–99)
Potassium: 4.1 mEq/L (ref 3.5–5.1)

## 2010-09-12 NOTE — Assessment & Plan Note (Signed)
Summary: ALLERGY REACTION / LFW   Vital Signs:  Patient profile:   50 year old male Height:      74 inches Weight:      248.25 pounds BMI:     31.99 Temp:     98.3 degrees F oral Pulse rate:   84 / minute Pulse rhythm:   regular BP sitting:   122 / 84  (left arm) Cuff size:   regular  Vitals Entered By: Delilah Shan CMA Edder Bellanca Dull) (September 05, 2010 4:14 PM) CC: Allergy reaction   History of Present Illness: Low T- some more energy. Improving erectile function.  No adverse effect.  Will be due for labs soon.   1 month ago started with pain in L upper abdomen under the ribs that does not radiate to back.  Sore to the touch.  Also with a sore spot on the back were sebaceous cyst was removed.  Anterior LUQ pain is present all the time.  Not short of breath.  No change in pain with exertion.  Nothing makes it better.  Hasn't tried any meds.  Some heartburn. No early FH CAD.  No h/o MI/HTN.  ON statin for HLD.  Now with lesions in the mouth.  Started on the tongue and then moved to the sides of the tongue and mouth- progressive over last few weeks.  Had a rash on the R elbow and R thumb that predates the oral changes, neither region itches.  He itches in other places- L arm- w/o the rash.  No wheeze, no FCNAV.  No new foods, soaps, triggers known.  His lips feel chapped.  No airway symptoms.   Allergies: 1)  ! Penicillin 2)  ! Amoxicillin 3)  ! 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 Wanda Plump with URO for benign nodularity in left hemiscrotum (2008) felt to be a variant of normal. Previously evaluated for murmur, mitral valve prolapse - No prophylaxis ABX needed per MD eval.   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   Children:  3 1 daughter, alive - ? POTTS - monitors salt intake.  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   Children:  3 1 daughter, alive - ? POTTS - monitors salt intake.  Recently diagnosed with Crohn's Disease   2 step-daughters  Review of Systems       See HPI.  Otherwise negative.    Physical Exam  General:  GEN: nad, alert and oriented HEENT: TM w/o erythema, nasal epithelium not injected, OP without cobblestoning, no exudates, tongue with  some nonerythematous papules on the lateral borders, no ulceration.  Similar appears lesions, all <1cm on buccal membranes bilaterally, lips looks slightly chapped and dry but not swollen.  mucous membranes moist o/w.  NECK: supple w/o LA CV: rrr. murmur noted, no change from prev PULM: ctab, no inc wob ABD: soft, +bs, LUQ tender to palpation under the ribs- ribs not tender to palpation, no rebound.   EXT: no edema   Impression &  Recommendations:  Problem # 1:  ABDOMINAL PAIN, LEFT UPPER QUADRANT (ICD-789.02) he doesn't drink or smoke.  No CP suggestive of cardiac source.  No change in cardiac exam. He has no exertional component or change in lower GI symptoms.  This may be gastric in origin.  I would check baseline labs, consider PPI, and then follow up as needed.  He agrees. Nontoxic and with symptoms that aren't increasing.  Orders: TLB-CBC Platelet - w/Differential (85025-CBCD) TLB-BMP (Basic Metabolic Panel-BMET) (80048-METABOL) TLB-Hepatic/Liver Function Pnl (80076-HEPATIC) TLB-Amylase (82150-AMYL) TLB-Lipase (83690-LIPASE)  Problem # 2:  OTHER&UNSPECIFIED DISEASES THE ORAL SOFT TISSUES (ICD-528.9) Unclear source.  I want patient to monitor this and call back if not improved.  he agrees.   Complete Medication List: 1)  Lipitor 20 Mg Tabs (Atorvastatin calcium) .... Take 1 tablet by mouth once a day 2)  Aspirin 81 Mg Tabs (Aspirin) .... Take 1 tablet by mouth once a day 3)  Depo-testosterone 200 Mg/ml Oil (Testosterone cypionate) .... 1.25 ml q 4 weeks 4)  Monoject Syringe 3 Ml Misc (Syringe (disposable)) .... Use every 4 weeks or as directed 5)  Bd Hypodermic Needle 23g X 1" Misc (Needle (disp)) .... Use every 4 weeks or as directed  Patient Instructions: 1)  Please call  me with an update in a few days.  I would take prilosec 20mg  by mouth once daily in the meantime.  2)  follow up labs in 2-3 weeks- AM lab draw for lipid-272.0, PSA/testosterone- 257.2   Orders Added: 1)  Est. Patient Level IV [29562] 2)  TLB-CBC Platelet - w/Differential [85025-CBCD] 3)  TLB-BMP (Basic Metabolic Panel-BMET) [80048-METABOL] 4)  TLB-Hepatic/Liver Function Pnl [80076-HEPATIC] 5)  TLB-Amylase [82150-AMYL] 6)  TLB-Lipase [83690-LIPASE]    Current Allergies (reviewed today): ! PENICILLIN ! AMOXICILLIN ! ERYTHROMYCIN

## 2010-09-26 ENCOUNTER — Other Ambulatory Visit: Payer: Self-pay | Admitting: Family Medicine

## 2010-09-26 DIAGNOSIS — E291 Testicular hypofunction: Secondary | ICD-10-CM

## 2010-09-26 DIAGNOSIS — E7800 Pure hypercholesterolemia, unspecified: Secondary | ICD-10-CM

## 2010-09-27 ENCOUNTER — Other Ambulatory Visit (INDEPENDENT_AMBULATORY_CARE_PROVIDER_SITE_OTHER): Payer: BC Managed Care – PPO | Admitting: Family Medicine

## 2010-09-27 DIAGNOSIS — E7800 Pure hypercholesterolemia, unspecified: Secondary | ICD-10-CM

## 2010-09-27 DIAGNOSIS — E291 Testicular hypofunction: Secondary | ICD-10-CM

## 2010-09-27 LAB — LIPID PANEL
Cholesterol: 167 mg/dL (ref 0–200)
HDL: 35.6 mg/dL — ABNORMAL LOW (ref >39.00)
Total CHOL/HDL Ratio: 5

## 2010-09-27 LAB — PSA: PSA: 0.36 ng/mL (ref 0.10–4.00)

## 2010-09-28 ENCOUNTER — Telehealth: Payer: Self-pay | Admitting: *Deleted

## 2010-09-28 MED ORDER — TESTOSTERONE CYPIONATE 200 MG/ML IM SOLN: 300.0000 mg | INTRAMUSCULAR | Status: DC

## 2010-09-28 NOTE — Progress Notes (Signed)
Please notify patient.  Testosterone level is still low. I would inc the dose as below and then recheck labs in 4-5 months.  See orders. His other labs are fine.  Thanks.  If he needs a new rx, please call this in. (I believe he gets a 10 ml vial of testosterone. If needed, please call in #1 vial with 1 refill).  Inject 1.44ml q4 weeks.  Thanks.

## 2010-09-28 NOTE — Progress Notes (Signed)
Addended by: Raechel Ache on: 09/28/2010 10:44 AM   Modules accepted: Orders

## 2010-10-01 NOTE — Telephone Encounter (Signed)
Please let me know if you need me to do anything on this.

## 2010-11-08 ENCOUNTER — Other Ambulatory Visit: Payer: Self-pay | Admitting: *Deleted

## 2010-11-08 MED ORDER — ATORVASTATIN CALCIUM 20 MG PO TABS: 20.0000 mg | ORAL_TABLET | Freq: Every day | ORAL | Status: DC

## 2010-11-08 NOTE — Telephone Encounter (Signed)
Patient says that he also needs a refill on levitra, but it is not on med list.

## 2010-11-09 MED ORDER — VARDENAFIL HCL 10 MG PO TABS: 10.0000 mg | ORAL_TABLET | Freq: Every day | ORAL | Status: DC | PRN

## 2010-11-09 MED ORDER — TESTOSTERONE CYPIONATE 200 MG/ML IM SOLN: 300.0000 mg | INTRAMUSCULAR | Status: DC

## 2010-11-09 NOTE — Telephone Encounter (Signed)
[facility], others sent electronically.  Patient notified via telephone.

## 2010-11-10 NOTE — Op Note (Signed)
NAME:  Steven [name], BOHNENKAMP NO.:  [id number]   MEDICAL RECORD NO.:  [id number]                   PATIENT TYPE:  AMB   LOCATION:  [facility]                                  FACILITY:   PHYSICIAN:  Jefry H. Pollyann Kennedy, M.D.                DATE OF BIRTH:  [birth year]   DATE OF PROCEDURE:  09/21/2002  DATE OF DISCHARGE:                                 OPERATIVE REPORT   PREOPERATIVE DIAGNOSES:  1. Nasal septal deviation.  2. Inferior turbinate hypertrophy.  3. Chronic ethmoid sinusitis.  4. Chronic frontal sinusitis.  5. Chronic maxillary sinusitis.   POSTOPERATIVE DIAGNOSES:  1. Nasal septal deviation.  2. Inferior turbinate hypertrophy.  3. Chronic ethmoid sinusitis.  4. Chronic frontal sinusitis.  5. Chronic maxillary sinusitis.   OPERATION/PROCEDURE:  1. Nasal septoplasty.  2. Outfracture, inferior turbinates.  3. Bilateral endoscopic anterior ethmoidectomy.  4. Bilateral endoscopic maxillary antrostomy.  5. Right endoscopic frontal sinusotomy.   SURGEON:  Jefry H. Pollyann Kennedy, M.D.   ANESTHESIA:  General endotracheal anesthesia was used.   COMPLICATIONS:  No complications.   ESTIMATED BLOOD LOSS:  100 cc.   FINDINGS:  Severe deviation of the septum towards the left with a large bony  spur posteriorly in the junction of the vomer and the ethmoid plate.  There  was severe deflection of the superior attachment of the ethmoid plate also  to the left side.  There was slight deflection of the left side of the  maxillary crest, again to the left side.  There were hyperplastic changes of  the inferior turbinates bilaterally.  There was mucosal disease within all  sinuses including edematous hyperplastic mucosa and polypoid disease within  the right frontal sinus.   REFERRING PHYSICIAN:  Dellis Anes. Heller, M.D.   DISPOSITION:  The patient tolerated the procedure well, was awakened,  extubated and transferred to recovery in stable condition.   HISTORY:  This is a  50 year old with a recent history of chronic and  recurring sinusitis.  Risks, benefits, alternatives, and complications of  the procedure were explained to the patient who seemed to understand and  agreed to surgery.   PROCEDURE:  The patient was taken to the operating room and placed on the  operating table in the supine position.  Following induction of general  endotracheal anesthesia, the patient was prepped and draped in the standard  fashion.  Oxymetazoline spray was used in the nose preoperatively.  He was  given 900 mg of clindamycin intravenously for prophylaxis of mitral valve  prolapse and history of penicillin and erythromycin sensitivity.  1%  Xylocaine with epinephrine was infiltrated into the septum, the columella,  the inferior turbinates, and the superior and posterior attachments of the  middle turbinates bilaterally.  Total of approximately 11 cc was used.  Afrin-soaked pledgets were placed in the nasal cavities bilaterally.  1. Nasal septoplasty.  A left hemitransfixion incision was created with a  15     scalpel.  Mucosal flap was developed down the left side.  Great care was     taken around the large spur not to tear the mucosa.  The bony     cartilaginous junction was divided and mucosal flap was developed down     the right side posteriorly.  The superior attachment of the ethmoid plate     was resected with a Jansen-Middleton rongeur.  The posterior and inferior     attachments were thin and were fractured and the majority of the ethmoid     plate and vomerian bone were resected.  This relieved the majority of the     nasal obstruction.  The maxillary crest on the left side was somewhat     deflected to the left and was shaved off using a 4 mm osteotome.  This     provided nice open airways bilaterally.  The mucosal incision was     reapproximated with chromic suture.  The septal flaps were then quilted     with a plain gut suture.  2. Outfracture, inferior  turbinates.  The inferior turbinates were incised     anteriorly and attempts were made to dissect mucosa off of the bone.  The     bone was very thin and flimsy and the turbinates were then just simply     outfractured with a Therapist, nutritional.  3. Bilateral endoscopic anterior ethmoidectomy.  The 0- and 30-degree nasal     endoscopes were used to examine the infundibular area.  The uncinate     process was taken down bilaterally with the sickle knife.  Straight     suction and suction debrider were used to exonerate all of the anterior     ethmoid cells back to the grand lamella but not through the grand     lamella.  A lateral limited dissection over the lamina papyracea  which     was kept intact bilaterally.  The superior limit of dissection with the     fovea which was also kept intact bilaterally.  There was a fair amount of     bloody oozing throughout the ethmoid cavity.  There were no dominant     bleeders identified.  At the termination of the procedure, the ethmoid     cavities were suctioned of blood and secretions and packed with ethmoid     packs.  4. Bilateral endoscopic maxillary antrostomy.  The fontanelle was identified     bilaterally, after having completed the anterior ethmoidectomy, and a     curved suction was entered into the maxillary sinus bilaterally.  The     backbiting forceps was used to enlarge the ostium anteriorly and straight     throughcut was used to enlarge it posteriorly and thoroughly.  Mucosa of     the medial wall was inflamed but the sinus itself was clear on both     sides.  5. Right endoscopic frontal sinusotomy.  After completing the ethmoidectomy,     the frontal recess area was examined bilaterally.  On the left side,     there was easy access into the sinus although the sinus mucosa was     somewhat hyperplastic.  On the right side, the frontal sinus also had a    large opening but it was filled with polypoid disease.  This was cleaned     out  using giraffe forceps.  All polypoid disease was removed  from the     ostium and around the anterior ethmoid region.  The nasal cavities were suctioned of blood and secretions, packed with  rolled-up Telfa coated with bacitracin.  The pharynx was suctioned of blood  under direct visualization.  The patient was then awakened, extubated and  transferred to recovery.                                               Jefry H. Pollyann Kennedy, M.D.    JHR/MEDQ  D:  09/21/2002  T:  09/21/2002  Job:  [id number]   cc:   Dellis Anes. Idell Pickles, M.D.  [address]  [place]  [place] [address]  Fax: [phone]

## 2010-12-01 ENCOUNTER — Encounter: Payer: Self-pay | Admitting: Family Medicine

## 2010-12-01 DIAGNOSIS — N50819 Testicular pain, unspecified: Secondary | ICD-10-CM

## 2010-12-01 HISTORY — DX: Testicular pain, unspecified: N50.819

## 2011-03-05 ENCOUNTER — Telehealth: Payer: Self-pay | Admitting: Family Medicine

## 2011-03-05 DIAGNOSIS — I150 Renovascular hypertension: Secondary | ICD-10-CM

## 2011-03-05 DIAGNOSIS — Z79899 Other long term (current) drug therapy: Secondary | ICD-10-CM

## 2011-03-05 NOTE — Telephone Encounter (Signed)
Pt due for repeat testosterone level in next month (before a dose). Please call to arrange.  Thanks.  Order is in.

## 2011-03-05 NOTE — Telephone Encounter (Signed)
Patient advised.  He just recently had the injection and will call tomorrow to set up labwork before the next injection is due.

## 2011-03-26 ENCOUNTER — Telehealth: Payer: Self-pay | Admitting: Family Medicine

## 2011-03-26 ENCOUNTER — Other Ambulatory Visit (INDEPENDENT_AMBULATORY_CARE_PROVIDER_SITE_OTHER): Payer: BC Managed Care – PPO

## 2011-03-26 DIAGNOSIS — E291 Testicular hypofunction: Secondary | ICD-10-CM

## 2011-03-26 LAB — CBC WITH DIFFERENTIAL/PLATELET
Basophils Relative: 0.6 % (ref 0.0–3.0)
Eosinophils Relative: 2.3 % (ref 0.0–5.0)
HCT: 44.5 % (ref 39.0–52.0)
Hemoglobin: 15 g/dL (ref 13.0–17.0)
Lymphs Abs: 3.1 10*3/uL (ref 0.7–4.0)
MCHC: 33.8 g/dL (ref 30.0–36.0)
MCV: 93.1 fl (ref 78.0–100.0)
Monocytes Absolute: 0.5 10*3/uL (ref 0.1–1.0)
Neutro Abs: 3.1 10*3/uL (ref 1.4–7.7)
RBC: 4.78 Mil/uL (ref 4.22–5.81)
WBC: 6.9 10*3/uL (ref 4.5–10.5)

## 2011-03-26 LAB — COMPREHENSIVE METABOLIC PANEL
Albumin: 4.2 g/dL (ref 3.5–5.2)
Alkaline Phosphatase: 89 U/L (ref 39–117)
BUN: 15 mg/dL (ref 6–23)
Creatinine, Ser: 0.9 mg/dL (ref 0.4–1.5)
Glucose, Bld: 105 mg/dL — ABNORMAL HIGH (ref 70–99)
Potassium: 4.2 mEq/L (ref 3.5–5.1)
Total Bilirubin: 0.9 mg/dL (ref 0.3–1.2)

## 2011-03-26 LAB — TESTOSTERONE: Testosterone: 92.72 ng/dL — ABNORMAL LOW (ref 350.00–890.00)

## 2011-03-26 LAB — PSA: PSA: 0.31 ng/mL (ref 0.10–4.00)

## 2011-03-26 MED ORDER — TESTOSTERONE CYPIONATE 200 MG/ML IM SOLN: 200.0000 mg | INTRAMUSCULAR | Status: DC

## 2011-03-26 NOTE — Telephone Encounter (Signed)
I called pt about his labs.  We'll dec the dose of the T but inc the frequency.  He has an effect from the medicine, but it doesn't last the entire month.  He agrees with the plan.  His other labs are fine.  Steven [name]- when I called him I didn't set the day for the lab visit.  I want him to get 4 doses in (each dose 2 weeks apart) and then get an AM trough level before the 5th dose.  Please set up the lab visit (~2.5 months).  Thanks.

## 2011-03-26 NOTE — Telephone Encounter (Signed)
Lab appt scheduled 05/14/2011 at 8:00 a.m.

## 2011-04-04 ENCOUNTER — Encounter: Payer: Self-pay | Admitting: Family Medicine

## 2011-04-05 ENCOUNTER — Encounter: Payer: Self-pay | Admitting: Family Medicine

## 2011-04-05 ENCOUNTER — Ambulatory Visit (INDEPENDENT_AMBULATORY_CARE_PROVIDER_SITE_OTHER): Payer: BC Managed Care – PPO | Admitting: Family Medicine

## 2011-04-05 DIAGNOSIS — K625 Hemorrhage of anus and rectum: Secondary | ICD-10-CM

## 2011-04-05 NOTE — Progress Notes (Signed)
Blood in stool last week with BM.  He was having bleed every other day over the weekend and then none in last few days.  BRBPR, small amount.  No pain with BM.  No FCNAV.  No other bleeding.  No abd pain, no diarrhea.  He's been sitting more at work and didn't know if that was related.  No FH/blood relatives colon CA, colitis, crohn's (stepdaughter did have crohn's).  Colonoscopy done last year. Had a TUBULAR ADENOMA (THREE FRAGMENTS).  - HYPERPLASTIC POLYP AND POLYPOID FRAGMENT OF BENIGN COLONIC MUCOSA. - NO HIGH GRADE DYSPLASIA OR MALIGNANCY.  Doesn't have to strain with BM.   Meds, vitals, and allergies reviewed.   ROS: See HPI.  Otherwise, noncontributory.  nad ncat rrr ctab abd benign Rectal exam wnl, no ext hemorrhoids or fissure, no gross blood.

## 2011-04-05 NOTE — Patient Instructions (Signed)
I think this was likely due to internal hemorrhoids.  Call me if you have more bleeding.  Take care.

## 2011-04-08 ENCOUNTER — Encounter: Payer: Self-pay | Admitting: Family Medicine

## 2011-04-08 DIAGNOSIS — K625 Hemorrhage of anus and rectum: Secondary | ICD-10-CM | POA: Insufficient documentation

## 2011-04-08 NOTE — Assessment & Plan Note (Signed)
Small amount of  BRBPR.  With unremarkable colonoscopy last year.  Now resolved.  Likely int hemorrhoids. Would observe for now.  If return of sx, he'll call back and we can refer for eval/likely banding. He agrees.

## 2011-05-07 ENCOUNTER — Other Ambulatory Visit (INDEPENDENT_AMBULATORY_CARE_PROVIDER_SITE_OTHER): Payer: BC Managed Care – PPO

## 2011-05-07 DIAGNOSIS — Z79899 Other long term (current) drug therapy: Secondary | ICD-10-CM

## 2011-05-08 LAB — TESTOSTERONE: Testosterone: 172.25 ng/dL — ABNORMAL LOW (ref 350.00–890.00)

## 2011-05-09 ENCOUNTER — Other Ambulatory Visit: Payer: Self-pay | Admitting: Family Medicine

## 2011-05-09 DIAGNOSIS — E291 Testicular hypofunction: Secondary | ICD-10-CM

## 2011-05-10 ENCOUNTER — Telehealth: Payer: Self-pay | Admitting: Family Medicine

## 2011-05-10 MED ORDER — TESTOSTERONE CYPIONATE 200 MG/ML IM SOLN: 300.0000 mg | INTRAMUSCULAR | Status: DC

## 2011-05-10 NOTE — Telephone Encounter (Signed)
Medication phoned to pharmacy.  

## 2011-05-10 NOTE — Telephone Encounter (Signed)
Please call in new rx for testosterone.  Thanks.

## 2011-05-14 ENCOUNTER — Other Ambulatory Visit: Payer: BC Managed Care – PPO

## 2011-07-17 ENCOUNTER — Other Ambulatory Visit (INDEPENDENT_AMBULATORY_CARE_PROVIDER_SITE_OTHER): Payer: BC Managed Care – PPO

## 2011-07-17 ENCOUNTER — Telehealth: Payer: Self-pay | Admitting: Family Medicine

## 2011-07-17 DIAGNOSIS — E291 Testicular hypofunction: Secondary | ICD-10-CM

## 2011-07-17 LAB — TESTOSTERONE: Testosterone: 301.03 ng/dL — ABNORMAL LOW (ref 350.00–890.00)

## 2011-07-17 MED ORDER — TESTOSTERONE CYPIONATE 200 MG/ML IM SOLN: 350.0000 mg | INTRAMUSCULAR | Status: DC

## 2011-07-17 NOTE — Telephone Encounter (Signed)
Patient advised.  He will phone in for the lab appt once he sees the appropriate time as described below.

## 2011-07-17 NOTE — Telephone Encounter (Signed)
Call pt.  T level is much improved, almost normal. I think if he takes 1.36mL every 2 weeks, that will get his level up to normal. Please notify pt.  Recheck T level in 2-3 months, before injection so it's a trough level, orders are in.  Thanks.

## 2011-09-03 ENCOUNTER — Encounter: Payer: Self-pay | Admitting: Family Medicine

## 2011-09-03 ENCOUNTER — Ambulatory Visit (INDEPENDENT_AMBULATORY_CARE_PROVIDER_SITE_OTHER): Payer: BC Managed Care – PPO | Admitting: Family Medicine

## 2011-09-03 DIAGNOSIS — J019 Acute sinusitis, unspecified: Secondary | ICD-10-CM | POA: Insufficient documentation

## 2011-09-03 DIAGNOSIS — R519 Headache, unspecified: Secondary | ICD-10-CM

## 2011-09-03 DIAGNOSIS — R51 Headache: Secondary | ICD-10-CM

## 2011-09-03 LAB — CBC WITH DIFFERENTIAL/PLATELET
Basophils Absolute: 0.1 10*3/uL (ref 0.0–0.1)
Basophils Relative: 0.7 % (ref 0.0–3.0)
Eosinophils Absolute: 0.4 10*3/uL (ref 0.0–0.7)
Lymphocytes Relative: 31.1 % (ref 12.0–46.0)
MCHC: 33.3 g/dL (ref 30.0–36.0)
Monocytes Absolute: 0.9 10*3/uL (ref 0.1–1.0)
Neutrophils Relative %: 55.5 % (ref 43.0–77.0)
Platelets: 208 10*3/uL (ref 150.0–400.0)
RBC: 5.29 Mil/uL (ref 4.22–5.81)

## 2011-09-03 MED ORDER — RIZATRIPTAN BENZOATE 10 MG PO TBDP: 10.0000 mg | ORAL_TABLET | ORAL | Status: AC | PRN

## 2011-09-03 MED ORDER — LEVOFLOXACIN 500 MG PO TABS: 500.0000 mg | ORAL_TABLET | Freq: Every day | ORAL | Status: AC

## 2011-09-03 NOTE — Patient Instructions (Signed)
Take levaquin as directed for sinus infection Keep using nasal saline spray  Use maxalt if needed  Update Dr Para March and Dr Vela Prose if headache is not improving Labs today - will update you

## 2011-09-03 NOTE — Assessment & Plan Note (Signed)
slt atypical in pt with hx of sinus surgery in past Very congested Cover with levaquin and update Possible that this could be triggering migraine/ cluster ha episodes  Will use warm compress and also nasal saline  Lab today in light of ha

## 2011-09-03 NOTE — Assessment & Plan Note (Signed)
I suspect migraine/ cluster reoccurance in pt with trigger of sinusitis Also may have mild parotitis (adv to suck on sugar free sour candies)  Px maxalt  Cover sinusitis with levaquin Check cbc and ESR for TA Update if not starting to improve in a week or if worsening   Does have a neurologist- Dr Vela Prose

## 2011-09-03 NOTE — Progress Notes (Signed)
Subjective:    Patient ID: Steven [name], male    DOB: 09/02/1960, 51 y.o.   MRN: [id number]  HPI Symptoms started middle of last week  Started with some achiness in neck and shoulders then began to get very congested  Cannot get any mucous out at all  Just tremendous pressure or headaches  Makes his teeth hurt too  R side of face feels swollen  Lot of pressure and tenderness Feels even like tongue is swollen   Dental exam normal in dec  Former smoker 3 years ago   Took advil sinus - no help  9 pm last night - took wife's oxycodone (made him nauseated 0    No fever-/ chills/ aches Feels tired     Has a hx of cluster headache  Has not seen neurologist in several years -- Dr Vela Prose   Patient Active Problem List  Diagnoses  . HYPOGONADISM  . HYPERLIPIDEMIA  . ERECTILE DYSFUNCTION, NON-ORGANIC  . VARICOCELE  . PLANTAR FASCIITIS  . INTERMITTENT VERTIGO  . HEADACHE  . TB SKIN TEST, POSITIVE  . HEART MURMUR, HX OF  . CHICKENPOX, HX OF  . OTHER&UNSPECIFIED DISEASES THE ORAL SOFT TISSUES  . ABDOMINAL PAIN, LEFT UPPER QUADRANT  . Testicular pain  . Blood per rectum  . Acute sinusitis  . Right-sided headache   Past Medical History  Diagnosis Date  . Low testosterone late 2010    on therapy since ~ 09/2009  . Varicocele 03/2010    Left sided, Uro eval   . Erectile dysfunction     Non organic  . Special screening for malignant neoplasms, colon   . Hypogonadism male     Previously evaluated by Dr. Wanda Plump with URO for benign noduarity in left hemiscrotum (2008) felt to be variant of normal.  . Plantar fasciitis   . Family history of diabetes mellitus   . Nonspecific reaction to tuberculin skin test without active tuberculosis   . Heart murmur     previously evaluated, mitral valve prolapse - No prophylaxis ABX needed per MD eval.  . History of chicken pox   . Hyperlipidemia   . Headache 10/31/2006    Cluster HA, diagnosed by Dr. Pollyann Kennedy   Past Surgical History   Procedure Date  . Nasal septum surgery 2003  . Nasal sinus surgery 2005    outpatient  . Refractive surgery   . Unremarkable follow up scrotal US per uro 2011   History  Substance Use Topics  . Smoking status: Former Smoker    Types: Cigarettes    Quit date: 10/29/2008  . Smokeless tobacco: Not on file  . Alcohol Use: Yes     Rare   Family History  Problem Relation Age of Onset  . Cancer Mother     Renal CA, treated 1993  . Parkinsonism Father   . Alzheimer's disease Father   . Diabetes Brother   . Diabetes Other   . Kidney disease Other   . Colon cancer Neg Hx    Allergies  Allergen Reactions  . Aleve Swelling  . Amoxicillin     REACTION: Red rash  . Erythromycin     REACTION: Red rash  . Penicillins     REACTION: Swelling   Current Outpatient Prescriptions on File Prior to Visit  Medication Sig Dispense Refill  . aspirin 81 MG tablet Take 81 mg by mouth daily.        Marland Kitchen atorvastatin (LIPITOR) 20 MG tablet Take 1 tablet (20  mg total) by mouth daily.  30 tablet  11  . SYRINGE-NEEDLE, DISP, 3 ML (MONOJECT 3CC SYR 23GX1") 23G X 1" 3 ML MISC Use every 4 weeks or as directed.       . testosterone cypionate (DEPO-TESTOSTERONE) 200 MG/ML injection Inject 1.75 mLs (350 mg total) into the muscle every 14 (fourteen) days.  10 mL  1      Review of Systems Review of Systems  Constitutional: Negative for fever, appetite change, fatigue and unexpected weight change.  Eyes: Negative for pain and visual disturbance. no photophobia ENT pos for nasal cong and sinus pain , no temporal tenderness Respiratory: Negative for cough and shortness of breath.   Cardiovascular: Negative for cp or palpitations    Gastrointestinal: Negative for nausea, diarrhea and constipation.  Genitourinary: Negative for urgency and frequency.  Skin: Negative for pallor or rash   Neurological: Negative for weakness, light-headedness, numbness and pos for ha  Hematological: Negative for adenopathy.  Does not bruise/bleed easily.  Psychiatric/Behavioral: Negative for dysphoric mood. The patient is not nervous/anxious.          Objective:   Physical Exam  Constitutional: He is oriented to person, place, and time. He appears well-developed and well-nourished. No distress.       overwt and well appearing   HENT:  Head: Normocephalic and atraumatic.  Right Ear: External ear normal.  Left Ear: External ear normal.  Mouth/Throat: Oropharynx is clear and moist. No oropharyngeal exudate.       Nares are injected and congested  Clear post nasal drip  Tender L maxillary sinus Also some swelling over parotid area No dental abn noted   No temporal tenderness  Eyes: Conjunctivae and EOM are normal. Pupils are equal, round, and reactive to light. Right eye exhibits no discharge. Left eye exhibits no discharge. No scleral icterus.  Neck: Normal range of motion. Neck supple. No JVD present. Carotid bruit is not present. No thyromegaly present.  Cardiovascular: Normal rate, regular rhythm and normal heart sounds.   Pulmonary/Chest: Effort normal and breath sounds normal. No respiratory distress. He has no wheezes.  Musculoskeletal: He exhibits no edema and no tenderness.  Lymphadenopathy:    He has no cervical adenopathy.  Neurological: He is alert and oriented to person, place, and time. He has normal strength and normal reflexes. He displays no atrophy and no tremor. No cranial nerve deficit or sensory deficit. He exhibits normal muscle tone. He displays a negative Romberg sign. Coordination and gait normal.       No focal cerebellar signs   Skin: Skin is warm and dry. No rash noted. No erythema. No pallor.  Psychiatric: He has a normal mood and affect.          Assessment & Plan:

## 2011-11-13 ENCOUNTER — Other Ambulatory Visit: Payer: Self-pay | Admitting: Family Medicine

## 2011-11-14 NOTE — Telephone Encounter (Signed)
Sent!

## 2011-12-06 ENCOUNTER — Other Ambulatory Visit: Payer: Self-pay | Admitting: Family Medicine

## 2011-12-06 NOTE — Telephone Encounter (Signed)
Does patient need OV and labs?  Please advise.

## 2011-12-07 ENCOUNTER — Other Ambulatory Visit: Payer: Self-pay

## 2011-12-07 NOTE — Telephone Encounter (Signed)
Message left on patient's VM of cell phone that gave personal identification.

## 2011-12-07 NOTE — Telephone Encounter (Signed)
Pt said [facility] [address] said we had not responded to refill for testosterone. [facility] said had fax error, verified our fax # and will refax. Pt notified while on phone.

## 2011-12-07 NOTE — Telephone Encounter (Signed)
Needs CPE with labs ahead of time in next few months.  rx sent.

## 2011-12-10 ENCOUNTER — Other Ambulatory Visit: Payer: Self-pay | Admitting: *Deleted

## 2011-12-10 NOTE — Telephone Encounter (Signed)
Faxed refill request   

## 2011-12-11 MED ORDER — TESTOSTERONE CYPIONATE 200 MG/ML IM SOLN: 350.0000 mg | INTRAMUSCULAR | Status: DC

## 2011-12-11 NOTE — Telephone Encounter (Signed)
Medication phoned to pharmacy.  LMOVM of patient's cell phone to get labwork.

## 2011-12-11 NOTE — Telephone Encounter (Signed)
Please call in.  Due for labs.

## 2012-01-14 ENCOUNTER — Other Ambulatory Visit (INDEPENDENT_AMBULATORY_CARE_PROVIDER_SITE_OTHER): Payer: BC Managed Care – PPO

## 2012-01-14 DIAGNOSIS — E291 Testicular hypofunction: Secondary | ICD-10-CM

## 2012-01-15 ENCOUNTER — Encounter: Payer: Self-pay | Admitting: *Deleted

## 2012-02-17 ENCOUNTER — Other Ambulatory Visit: Payer: Self-pay | Admitting: Family Medicine

## 2012-02-17 DIAGNOSIS — E7800 Pure hypercholesterolemia, unspecified: Secondary | ICD-10-CM

## 2012-02-17 DIAGNOSIS — E349 Endocrine disorder, unspecified: Secondary | ICD-10-CM

## 2012-02-21 ENCOUNTER — Other Ambulatory Visit (INDEPENDENT_AMBULATORY_CARE_PROVIDER_SITE_OTHER): Payer: BC Managed Care – PPO

## 2012-02-21 DIAGNOSIS — E7800 Pure hypercholesterolemia, unspecified: Secondary | ICD-10-CM

## 2012-02-21 DIAGNOSIS — E349 Endocrine disorder, unspecified: Secondary | ICD-10-CM

## 2012-02-21 DIAGNOSIS — E291 Testicular hypofunction: Secondary | ICD-10-CM

## 2012-02-21 LAB — CBC WITH DIFFERENTIAL/PLATELET
Basophils Absolute: 0 10*3/uL (ref 0.0–0.1)
Eosinophils Absolute: 0.2 10*3/uL (ref 0.0–0.7)
Hemoglobin: 17.1 g/dL — ABNORMAL HIGH (ref 13.0–17.0)
Lymphocytes Relative: 37.1 % (ref 12.0–46.0)
MCHC: 32.7 g/dL (ref 30.0–36.0)
Neutro Abs: 4.1 10*3/uL (ref 1.4–7.7)
Platelets: 195 10*3/uL (ref 150.0–400.0)
RDW: 13.3 % (ref 11.5–14.6)

## 2012-02-21 LAB — COMPREHENSIVE METABOLIC PANEL
ALT: 35 U/L (ref 0–53)
AST: 32 U/L (ref 0–37)
Albumin: 4.1 g/dL (ref 3.5–5.2)
CO2: 24 mEq/L (ref 19–32)
Calcium: 8.9 mg/dL (ref 8.4–10.5)
Chloride: 105 mEq/L (ref 96–112)
Creatinine, Ser: 0.9 mg/dL (ref 0.4–1.5)
Potassium: 4.1 mEq/L (ref 3.5–5.1)
Sodium: 139 mEq/L (ref 135–145)
Total Protein: 6.6 g/dL (ref 6.0–8.3)

## 2012-02-21 LAB — PSA: PSA: 0.51 ng/mL (ref 0.10–4.00)

## 2012-02-21 LAB — LIPID PANEL
LDL Cholesterol: 88 mg/dL (ref 0–99)
Total CHOL/HDL Ratio: 4

## 2012-02-28 ENCOUNTER — Encounter: Payer: Self-pay | Admitting: Family Medicine

## 2012-02-28 ENCOUNTER — Ambulatory Visit (INDEPENDENT_AMBULATORY_CARE_PROVIDER_SITE_OTHER): Payer: BC Managed Care – PPO | Admitting: Family Medicine

## 2012-02-28 VITALS — BP 124/82 | HR 80 | Temp 98.2°F | Ht 74.0 in | Wt 250.0 lb

## 2012-02-28 DIAGNOSIS — E785 Hyperlipidemia, unspecified: Secondary | ICD-10-CM

## 2012-02-28 DIAGNOSIS — L821 Other seborrheic keratosis: Secondary | ICD-10-CM

## 2012-02-28 DIAGNOSIS — E291 Testicular hypofunction: Secondary | ICD-10-CM

## 2012-02-28 DIAGNOSIS — Z0000 Encounter for general adult medical examination without abnormal findings: Secondary | ICD-10-CM

## 2012-02-28 NOTE — Patient Instructions (Addendum)
I would get a flu shot each fall.   Don't change your meds for now.  Recheck labs in 6 months.   Glad to see you.

## 2012-02-28 NOTE — Progress Notes (Signed)
CPE- See plan.  Routine anticipatory guidance given to patient.  See health maintenance. Tetanus 2005 Flu shot encouraged.   Colonoscopy up to date.  PSA wnl.  Advance directive.  Has a living will.  Wife is designated if he were incapacitated.    Hypogonadism.  Fatigue is improved.  T level is improved.  Labs discussed.  No ADE.    L ear hearing is muffled.  Noted recently.  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    "Knots on the head" and skin tags.  Wanted these checked.  No itchy but occ irritated, if a comb hits them while combing hair.   Mild hyperglycemia on labs, noted that patient wasn't fasting when this was drawn.    Elevated Cholesterol: Using medications without problems:yes Muscle aches: no Diet compliance: yes Exercise:yes  PMH and SH reviewed  Meds, vitals, and allergies reviewed.   ROS: See HPI.  Otherwise negative.    GEN: nad, alert and oriented HEENT: mucous membranes moist NECK: supple w/o LA CV: rrr. PULM: ctab, no inc wob ABD: soft, +bs EXT: no edema SKIN: no acute rash but skin tags and benign appearing SKs noted.

## 2012-02-29 DIAGNOSIS — Z0000 Encounter for general adult medical examination without abnormal findings: Secondary | ICD-10-CM | POA: Insufficient documentation

## 2012-02-29 DIAGNOSIS — L821 Other seborrheic keratosis: Secondary | ICD-10-CM | POA: Insufficient documentation

## 2012-02-29 NOTE — Assessment & Plan Note (Signed)
Controlled, continue current meds and recheck labs in 6 months.  He agrees.  Will follow hgb but I wouldn't intervent for level of 17.1.  D/w pt and he understood.

## 2012-02-29 NOTE — Assessment & Plan Note (Signed)
Routine anticipatory guidance given to patient.  See health maintenance. Tetanus 2005 Flu shot encouraged.   Colonoscopy up to date.  PSA wnl.  Advance directive.  Has a living will.  Wife is designated if he were incapacitated.   Hyperglycemia is likely not significant, he wasn't fasting.   D/w pt about hearing protection

## 2012-02-29 NOTE — Assessment & Plan Note (Signed)
Benign appearing, f/u prn.

## 2012-02-29 NOTE — Assessment & Plan Note (Signed)
Controlled, continue current meds.   

## 2012-04-03 ENCOUNTER — Ambulatory Visit (INDEPENDENT_AMBULATORY_CARE_PROVIDER_SITE_OTHER): Payer: BC Managed Care – PPO | Admitting: Family Medicine

## 2012-04-03 ENCOUNTER — Encounter: Payer: Self-pay | Admitting: Family Medicine

## 2012-04-03 VITALS — BP 128/80 | HR 92 | Temp 98.2°F | Wt 249.0 lb

## 2012-04-03 DIAGNOSIS — L989 Disorder of the skin and subcutaneous tissue, unspecified: Secondary | ICD-10-CM

## 2012-04-03 MED ORDER — ACYCLOVIR 400 MG PO TABS: 400.0000 mg | ORAL_TABLET | Freq: Three times a day (TID) | ORAL | Status: DC

## 2012-04-03 NOTE — Patient Instructions (Addendum)
Start the acyclovir and this should improve.  We'll contact you with your lab report.

## 2012-04-03 NOTE — Assessment & Plan Note (Signed)
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  Known exposure.  No concern for other STD exposure.  This doesn't appear to be a cellulitis and the testicles aren't involved based on exam.  D/w pt.  He understood.

## 2012-04-03 NOTE — Progress Notes (Signed)
Thought he had a pimple on his scrotum. He's had a few pimples since starting testosterone.  No FCNAVD.  Area is sore.  No dysuria.  Single partner, she has h/o HSV but he's never had it to his knowledge.  He had a long drive last week and noted some itching at the time.    Bumps on my head- mobile on the scalp.   Meds, vitals, and allergies reviewed.   ROS: See HPI.  Otherwise, noncontributory.  nad nontender nonirritated sebaceous lesions on the scalp noted- he'll notify me if worse.  Testes bilaterally descended without nodularity, tenderness or masses. No scrotal masses or lesions except for 1 small (<1cm) ulcerated lesion on the L side of scrotum near the base of the penis. No penis lesions otherwise or urethral discharge.

## 2012-04-18 ENCOUNTER — Telehealth: Payer: Self-pay

## 2012-04-18 MED ORDER — ACYCLOVIR 400 MG PO TABS: 400.0000 mg | ORAL_TABLET | Freq: Three times a day (TID) | ORAL | Status: DC

## 2012-04-18 NOTE — Telephone Encounter (Signed)
Patient says it is in the same spot.  It is painful and is itching.  Today it is very red all around it.  He says it looks like the beginning of an acne pimple and that is exactly like it did last time and he doesn't want it to get that bad again.

## 2012-04-18 NOTE — Telephone Encounter (Signed)
Call pt- get some details- pain, drainage, does appear unroofed like an ulcer or is it a blister, etc?

## 2012-04-18 NOTE — Telephone Encounter (Signed)
Then I would restart the acyclovir.  rx sent.  If this becomes recurrent we'll need to discuss other options.  I would use the acyclovir in the meantime.

## 2012-04-18 NOTE — Telephone Encounter (Signed)
Patient advised.

## 2012-04-18 NOTE — Telephone Encounter (Signed)
Pt finished med last Fri; rash went away but has started to return in same area on 04/17/12.Please advise. [facility] [address]

## 2012-05-26 ENCOUNTER — Other Ambulatory Visit: Payer: Self-pay

## 2012-05-26 NOTE — Telephone Encounter (Signed)
Pt left v/m requesting refill on testosterone injection to [facility] [address].Please advise. Pt request call back when done.

## 2012-05-27 MED ORDER — TESTOSTERONE CYPIONATE 200 MG/ML IM SOLN: INTRAMUSCULAR | Status: DC

## 2012-05-27 NOTE — Telephone Encounter (Signed)
Please call in.  Thanks.   

## 2012-05-27 NOTE — Telephone Encounter (Signed)
Rx phoned to pharmacy.  

## 2012-11-28 ENCOUNTER — Ambulatory Visit (INDEPENDENT_AMBULATORY_CARE_PROVIDER_SITE_OTHER): Payer: BC Managed Care – PPO | Admitting: Family Medicine

## 2012-11-28 ENCOUNTER — Other Ambulatory Visit: Payer: Self-pay | Admitting: Family Medicine

## 2012-11-28 ENCOUNTER — Encounter: Payer: Self-pay | Admitting: Family Medicine

## 2012-11-28 VITALS — BP 106/80 | HR 88 | Temp 97.5°F | Wt 249.0 lb

## 2012-11-28 DIAGNOSIS — J019 Acute sinusitis, unspecified: Secondary | ICD-10-CM

## 2012-11-28 DIAGNOSIS — E785 Hyperlipidemia, unspecified: Secondary | ICD-10-CM

## 2012-11-28 MED ORDER — ATORVASTATIN CALCIUM 20 MG PO TABS: 10.0000 mg | ORAL_TABLET | Freq: Every day | ORAL | Status: DC

## 2012-11-28 MED ORDER — DOXYCYCLINE HYCLATE 100 MG PO TABS: 100.0000 mg | ORAL_TABLET | Freq: Two times a day (BID) | ORAL | Status: DC

## 2012-11-28 NOTE — Patient Instructions (Addendum)
Drink plenty of fluids and start the antibiotics today.  This should gradually improve.  Take care.  Let us know if you have other concerns.   I would try the lipitor at 10mg  a day; stop if you have aches.  Recheck labs before a physical in the fall.

## 2012-11-28 NOTE — Telephone Encounter (Signed)
Medication phoned to pharmacy.  

## 2012-11-28 NOTE — Assessment & Plan Note (Signed)
Nontoxic, start doxy, supportive tx.  He agrees.

## 2012-11-28 NOTE — Telephone Encounter (Signed)
Electronic refill request.  Please advise. 

## 2012-11-28 NOTE — Progress Notes (Signed)
"  I have a sinus infection."  Possible sick contacts and he's been around a lot of smokers.  Worse over the last week.  Sinus pressure, ST, HA.  No fevers.  R>L ear pain.  Fatigued.  Rare cough, but it can happen in fits.  Dry cough.  Taking dayquil and nyquil.  Yesterday tried guaifenesin. He's had some R maxillary tooth pain  Still on T and will be due for labs this fall.    He had some aches on 20mg  of lipitor.  I asked him to retry at 10mg  a day.    Meds, vitals, and allergies reviewed.   ROS: See HPI.  Otherwise, noncontributory.  GEN: nad, alert and oriented HEENT: mucous membranes moist, tm w/o erythema, nasal exam w/o erythema, clear discharge noted,  OP with cobblestoning, R max sinus ttp NECK: supple w/o LA CV: rrr.   PULM: ctab, no inc wob EXT: no edema SKIN: no acute rash

## 2012-11-28 NOTE — Assessment & Plan Note (Signed)
Would try lipitor at 10mg  a day, stop if aches return.

## 2012-11-28 NOTE — Telephone Encounter (Signed)
Please call in

## 2012-12-11 ENCOUNTER — Encounter: Payer: Self-pay | Admitting: Family Medicine

## 2012-12-11 ENCOUNTER — Ambulatory Visit (INDEPENDENT_AMBULATORY_CARE_PROVIDER_SITE_OTHER): Payer: BC Managed Care – PPO | Admitting: Family Medicine

## 2012-12-11 VITALS — BP 114/76 | HR 96 | Temp 98.3°F | Wt 248.5 lb

## 2012-12-11 DIAGNOSIS — H60391 Other infective otitis externa, right ear: Secondary | ICD-10-CM

## 2012-12-11 DIAGNOSIS — H60399 Other infective otitis externa, unspecified ear: Secondary | ICD-10-CM

## 2012-12-11 MED ORDER — CIPROFLOXACIN-HYDROCORTISONE 0.2-1 % OT SUSP: 3.0000 [drp] | Freq: Two times a day (BID) | OTIC | Status: DC

## 2012-12-11 MED ORDER — NEOMYCIN-POLYMYXIN-HC 3.5-10000-1 OT SOLN: 3.0000 [drp] | Freq: Four times a day (QID) | OTIC | Status: DC

## 2012-12-11 NOTE — Patient Instructions (Addendum)
Use the drops four times a day and keep the ear dry (avoid swimming, keep covered in the shower).  Don't use qtips.  This should gradually get better.  Take care.  Notify us if not improving.

## 2012-12-11 NOTE — Progress Notes (Signed)
Swimmer. R ear pain. No trauma.  No L ear pain.  Pinna sore with manipulation.  No FCNAVD. He feels well except for the ear pain.    Meds, vitals, and allergies reviewed.   ROS: See HPI.  Otherwise, noncontributory.  nad ncat Mmm Nasal exam wnl R canal with irritation, erythema, no FB noted.   L canal wnl TMs w/o perf B.

## 2012-12-12 DIAGNOSIS — H60399 Other infective otitis externa, unspecified ear: Secondary | ICD-10-CM | POA: Insufficient documentation

## 2012-12-12 NOTE — Assessment & Plan Note (Signed)
Doesn't need debridement or wick.  Start cortisporin and f/u prn.  Routine cautions given.  He agrees.

## 2012-12-24 ENCOUNTER — Encounter: Payer: Self-pay | Admitting: Family Medicine

## 2012-12-24 ENCOUNTER — Ambulatory Visit (INDEPENDENT_AMBULATORY_CARE_PROVIDER_SITE_OTHER): Payer: BC Managed Care – PPO | Admitting: Family Medicine

## 2012-12-24 VITALS — BP 128/78 | HR 88 | Temp 98.2°F | Wt 246.0 lb

## 2012-12-24 DIAGNOSIS — K625 Hemorrhage of anus and rectum: Secondary | ICD-10-CM

## 2012-12-24 NOTE — Patient Instructions (Addendum)
Clear liquids for now.  If you improve/resolve, then no other intervention.  If continued episodic blood w/o fever or pain, then notify me to set up GI referral.  If dramatically worse, then to ER.  Take care.

## 2012-12-24 NOTE — Assessment & Plan Note (Signed)
Diverticulosis vs AVM likely.  ddx d/w pt.  Clear liquids for now.  If he improves/resolves, then no other intervention.  If continued episodic blood w/o fever or pain, then he'll notify me to set up GI referral.  If dramatically worse, then to ER.  He agrees.

## 2012-12-24 NOTE — Progress Notes (Signed)
BRBPR noted recently.  Sunday had a hard/dry BM.  Monday with blood on TP and in toilet. Tuesday normal BM, felt a "little queasy."  Today with blood on TP and in toilet.    Prev with colonoscopy noted.  1) Five subcentimeter polyps; all were removed and all were sent  to pathology  2) Mild diverticulosis in the sigmoid to descending colon  segments  3) Otherwise normal examination  Some pain with prolonged sitting, but that was more in the hip area, not rectal area.  No FCV.  No abd pain now or prev.  No pain with BM.  No diarrhea.  No travel or exotic foods.    Meds, vitals, and allergies reviewed.   ROS: See HPI.  Otherwise, noncontributory.  nad ncat Mmm rrr ctab abd soft, not ttp except mildly ttp near the epigastrum, no rebound, no masses Rectal inspection w/o ext hemorrhoid or fissure.

## 2013-01-28 ENCOUNTER — Encounter: Payer: Self-pay | Admitting: Gastroenterology

## 2013-02-03 ENCOUNTER — Other Ambulatory Visit: Payer: Self-pay | Admitting: Family Medicine

## 2013-03-04 ENCOUNTER — Other Ambulatory Visit (INDEPENDENT_AMBULATORY_CARE_PROVIDER_SITE_OTHER): Payer: BC Managed Care – PPO

## 2013-03-04 DIAGNOSIS — E291 Testicular hypofunction: Secondary | ICD-10-CM

## 2013-03-04 LAB — CBC WITH DIFFERENTIAL/PLATELET
Basophils Absolute: 0.1 10*3/uL (ref 0.0–0.1)
Basophils Relative: 0.7 % (ref 0.0–3.0)
Eosinophils Absolute: 0.3 10*3/uL (ref 0.0–0.7)
Eosinophils Relative: 3.2 % (ref 0.0–5.0)
HCT: 51.7 % (ref 39.0–52.0)
Hemoglobin: 17.6 g/dL — ABNORMAL HIGH (ref 13.0–17.0)
Lymphocytes Relative: 43.1 % (ref 12.0–46.0)
Lymphs Abs: 3.7 10*3/uL (ref 0.7–4.0)
MCHC: 33.9 g/dL (ref 30.0–36.0)
MCV: 92.3 fl (ref 78.0–100.0)
Monocytes Absolute: 0.6 10*3/uL (ref 0.1–1.0)
Monocytes Relative: 7.6 % (ref 3.0–12.0)
Neutro Abs: 3.9 10*3/uL (ref 1.4–7.7)
Neutrophils Relative %: 45.4 % (ref 43.0–77.0)
Platelets: 206 10*3/uL (ref 150.0–400.0)
RBC: 5.6 Mil/uL (ref 4.22–5.81)
RDW: 13.4 % (ref 11.5–14.6)
WBC: 8.6 10*3/uL (ref 4.5–10.5)

## 2013-03-04 LAB — COMPREHENSIVE METABOLIC PANEL
ALT: 43 U/L (ref 0–53)
AST: 31 U/L (ref 0–37)
Albumin: 4.6 g/dL (ref 3.5–5.2)
Alkaline Phosphatase: 69 U/L (ref 39–117)
BUN: 16 mg/dL (ref 6–23)
CO2: 25 mEq/L (ref 19–32)
Calcium: 9.5 mg/dL (ref 8.4–10.5)
Chloride: 102 mEq/L (ref 96–112)
Creatinine, Ser: 1 mg/dL (ref 0.4–1.5)
GFR: 86.87 mL/min (ref >60.00)
Glucose, Bld: 120 mg/dL — ABNORMAL HIGH (ref 70–99)
Potassium: 4.1 mEq/L (ref 3.5–5.1)
Sodium: 136 mEq/L (ref 135–145)
Total Bilirubin: 1.1 mg/dL (ref 0.3–1.2)
Total Protein: 7.3 g/dL (ref 6.0–8.3)

## 2013-03-04 LAB — PSA: PSA: 0.41 ng/mL (ref 0.10–4.00)

## 2013-03-10 ENCOUNTER — Encounter: Payer: Self-pay | Admitting: Family Medicine

## 2013-03-10 ENCOUNTER — Ambulatory Visit (INDEPENDENT_AMBULATORY_CARE_PROVIDER_SITE_OTHER): Payer: BC Managed Care – PPO | Admitting: Family Medicine

## 2013-03-10 VITALS — BP 118/80 | HR 76 | Temp 98.1°F | Ht 74.0 in | Wt 244.0 lb

## 2013-03-10 DIAGNOSIS — R739 Hyperglycemia, unspecified: Secondary | ICD-10-CM

## 2013-03-10 DIAGNOSIS — Z23 Encounter for immunization: Secondary | ICD-10-CM

## 2013-03-10 DIAGNOSIS — E291 Testicular hypofunction: Secondary | ICD-10-CM

## 2013-03-10 DIAGNOSIS — N529 Male erectile dysfunction, unspecified: Secondary | ICD-10-CM

## 2013-03-10 DIAGNOSIS — Z0000 Encounter for general adult medical examination without abnormal findings: Secondary | ICD-10-CM

## 2013-03-10 DIAGNOSIS — R7309 Other abnormal glucose: Secondary | ICD-10-CM

## 2013-03-10 DIAGNOSIS — E785 Hyperlipidemia, unspecified: Secondary | ICD-10-CM

## 2013-03-10 HISTORY — DX: Male erectile dysfunction, unspecified: N52.9

## 2013-03-10 HISTORY — DX: Hyperglycemia, unspecified: R73.9

## 2013-03-10 MED ORDER — ATORVASTATIN CALCIUM 20 MG PO TABS: 10.0000 mg | ORAL_TABLET | Freq: Every day | ORAL | Status: DC

## 2013-03-10 MED ORDER — VARDENAFIL HCL 10 MG PO TABS: ORAL_TABLET | ORAL | Status: DC

## 2013-03-10 NOTE — Assessment & Plan Note (Signed)
Routine anticipatory guidance given to patient.  See health maintenance. PSA wnl, no LUTS Colon cancer screening.  He'll be due soon and he can call to get scheduled.  He'll let me know if he needs help with that.  Tetanus 2005 Flu shot today.  PNA and shingles shots not due yet.   Living will encouraged, he'll check on this.  Wife would be designated if incapacitated.   Diet and exercise d/w pt.  Encouraged both.  Diet is "getting better."  He plans to work on getting more exercise.   He is busy with work. Family is doing well.  He has a new grandson.

## 2013-03-10 NOTE — Patient Instructions (Addendum)
Call about your colonoscopy.   Check on a living will.   Go to the lab on the way out.  We'll contact you with your lab report. Take care.

## 2013-03-10 NOTE — Assessment & Plan Note (Signed)
Tolerating med, continue this dose.  He agrees.

## 2013-03-10 NOTE — Assessment & Plan Note (Signed)
Low trough T.  Hgb wouldn't need phlebotomy now.  D/w pt.  We can follow episodically.  He feels better with treatment.  Risk and benefits d/w pt.  Continue current dose.

## 2013-03-10 NOTE — Assessment & Plan Note (Signed)
Continue current meds 

## 2013-03-10 NOTE — Progress Notes (Signed)
CPE- See plan.  Routine anticipatory guidance given to patient.  See health maintenance. PSA wnl, no LUTS Colon cancer screening.  He'll be due soon and he can call to get scheduled.  He'll let me know if he needs help with that.  Tetanus 2005 Flu shot today.  PNA and shingles shots not due yet.   Living will encouraged, he'll check on this.  Wife would be designated if incapacitated.   Diet and exercise d/w pt.  Encouraged both.  Diet is "getting better."  He plans to work on getting more exercise.   He is busy with work. Family is doing well.  He has a new grandson.    Hyperglycemia.  He wasn't fasting- it had sugar in the coffee.   Low T.  Level was a trough level.  Labs d/w pt.  H/o ED.  PSA wnl. No FH prostate CA.  He feels better on the medicine, ie T. Hgb mildly elevated and we can check this episodically.    Elevated Cholesterol: Using medications without problems:yes Muscle aches: not on current dose Diet compliance:yes Exercise:yes  PMH and SH reviewed  Meds, vitals, and allergies reviewed.   ROS: See HPI.  Otherwise negative.    GEN: nad, alert and oriented HEENT: mucous membranes moist NECK: supple w/o LA CV: rrr. PULM: ctab, no inc wob ABD: soft, +bs EXT: no edema SKIN: no acute rash

## 2013-03-10 NOTE — Assessment & Plan Note (Signed)
See notes on labs. 

## 2013-07-06 ENCOUNTER — Other Ambulatory Visit: Payer: Self-pay | Admitting: Family Medicine

## 2013-07-06 NOTE — Telephone Encounter (Signed)
plz phone in. 

## 2013-07-06 NOTE — Telephone Encounter (Signed)
Medication phoned to pharmacy.  

## 2013-07-06 NOTE — Telephone Encounter (Signed)
Electronic refill request.  Please advise. 

## 2013-09-14 ENCOUNTER — Encounter: Payer: Self-pay | Admitting: Gastroenterology

## 2013-11-17 ENCOUNTER — Encounter: Payer: Self-pay | Admitting: Gastroenterology

## 2013-12-23 ENCOUNTER — Ambulatory Visit (AMBULATORY_SURGERY_CENTER): Payer: Self-pay | Admitting: *Deleted

## 2013-12-23 VITALS — Ht 74.0 in | Wt 240.6 lb

## 2013-12-23 DIAGNOSIS — Z86010 Personal history of colonic polyps: Secondary | ICD-10-CM

## 2013-12-23 MED ORDER — MOVIPREP 100 G PO SOLR: ORAL | Status: DC

## 2013-12-23 NOTE — Progress Notes (Signed)
No allergies to eggs or soy. No problems with anesthesia.  Pt given Emmi instructions for colonoscopy  No oxygen use  No diet drug use  

## 2013-12-24 ENCOUNTER — Encounter: Payer: Self-pay | Admitting: Gastroenterology

## 2014-01-06 ENCOUNTER — Ambulatory Visit (AMBULATORY_SURGERY_CENTER): Payer: BC Managed Care – PPO | Admitting: Gastroenterology

## 2014-01-06 ENCOUNTER — Encounter: Payer: Self-pay | Admitting: Gastroenterology

## 2014-01-06 VITALS — BP 128/84 | HR 71 | Temp 95.1°F | Resp 16 | Ht 74.0 in | Wt 240.0 lb

## 2014-01-06 DIAGNOSIS — D122 Benign neoplasm of ascending colon: Secondary | ICD-10-CM

## 2014-01-06 DIAGNOSIS — D126 Benign neoplasm of colon, unspecified: Secondary | ICD-10-CM

## 2014-01-06 DIAGNOSIS — Z86010 Personal history of colonic polyps: Secondary | ICD-10-CM

## 2014-01-06 HISTORY — PX: COLONOSCOPY WITH PROPOFOL: SHX5780

## 2014-01-06 MED ORDER — SODIUM CHLORIDE 0.9 % IV SOLN: 500.0000 mL | INTRAVENOUS | Status: DC

## 2014-01-06 NOTE — Patient Instructions (Signed)
YOU HAD AN ENDOSCOPIC PROCEDURE TODAY AT THE [facility]: Refer to the procedure report that was given to you for any specific questions about what was found during the examination.  If the procedure report does not answer your questions, please call your gastroenterologist to clarify.  If you requested that your care partner not be given the details of your procedure findings, then the procedure report has been included in a sealed envelope for you to review at your convenience later.  YOU SHOULD EXPECT: Some feelings of bloating in the abdomen. Passage of more gas than usual.  Walking can help get rid of the air that was put into your GI tract during the procedure and reduce the bloating. If you had a lower endoscopy (such as a colonoscopy or flexible sigmoidoscopy) you may notice spotting of blood in your stool or on the toilet paper. If you underwent a bowel prep for your procedure, then you may not have a normal bowel movement for a few days.  DIET: Your first meal following the procedure should be a light meal and then it is ok to progress to your normal diet.  A half-sandwich or bowl of soup is an example of a good first meal.  Heavy or fried foods are harder to digest and may make you feel nauseous or bloated.  Likewise meals heavy in dairy and vegetables can cause extra gas to form and this can also increase the bloating.  Drink plenty of fluids but you should avoid alcoholic beverages for 24 hours.  ACTIVITY: Your care partner should take you home directly after the procedure.  You should plan to take it easy, moving slowly for the rest of the day.  You can resume normal activity the day after the procedure however you should NOT DRIVE or use heavy machinery for 24 hours (because of the sedation medicines used during the test).    SYMPTOMS TO REPORT IMMEDIATELY: A gastroenterologist can be reached at any hour.  During normal business hours, 8:30 AM to 5:00 PM Monday through Friday,  call [phone].  After hours and on weekends, please call the GI answering service at [phone] who will take a message and have the physician on call contact you.   Following lower endoscopy (colonoscopy or flexible sigmoidoscopy):  Excessive amounts of blood in the stool  Significant tenderness or worsening of abdominal pains  Swelling of the abdomen that is new, acute  Fever of 100F or higher  FOLLOW UP: If any biopsies were taken you will be contacted by phone or by letter within the next 1-3 weeks.  Call your gastroenterologist if you have not heard about the biopsies in 3 weeks.  Our staff will call the home number listed on your records the next business day following your procedure to check on you and address any questions or concerns that you may have at that time regarding the information given to you following your procedure. This is a courtesy call and so if there is no answer at the home number and we have not heard from you through the emergency physician on call, we will assume that you have returned to your regular daily activities without incident.  SIGNATURES/CONFIDENTIALITY: You and/or your care partner have signed paperwork which will be entered into your electronic medical record.  These signatures attest to the fact that that the information above on your After Visit Summary has been reviewed and is understood.  Full responsibility of the confidentiality of this   discharge information lies with you and/or your care-partner.    Resume medications. Information given on polyps with discharge instructions. 

## 2014-01-06 NOTE — Op Note (Signed)
[place]  [address] [place], [id number]   COLONOSCOPY PROCEDURE REPORT  PATIENT: [name], Steven  MR#: [id number] BIRTHDATE: 06-Feb-1961 , 53  yrs. old GENDER: Male ENDOSCOPIST: Milus Banister, MD PROCEDURE DATE:  01/06/2014 PROCEDURE:   Colonoscopy with snare polypectomy First Screening Colonoscopy - Avg.  risk and is 50 yrs.  old or older - No.  Prior Negative Screening - Now for repeat screening. N/A  History of Adenoma - Now for follow-up colonoscopy & has been > or = to 3 yrs.  Yes hx of adenoma.  Has been 3 or more years since last colonoscopy.  Polyps Removed Today? Yes. ASA CLASS:   Class II INDICATIONS:5 polyps removed 2011, three of them were adenomatous.  MEDICATIONS: MAC sedation, administered by CRNA and propofol (Diprivan) 400mg  IV  DESCRIPTION OF PROCEDURE:   After the risks benefits and alternatives of the procedure were thoroughly explained, informed consent was obtained.  A digital rectal exam revealed no abnormalities of the rectum.   The [id number]  endoscope was introduced through the anus and advanced to the cecum, which was identified by both the appendix and ileocecal valve. No adverse events experienced.   The quality of the prep was excellent.  The instrument was then slowly withdrawn as the colon was fully examined.  COLON FINDINGS: Two polyps were found, removed and sent to pathology.  These were both sessile, located in ascending segment, 61mm across, removed with cold snare.  The examination was otherwise normal.  Retroflexed views revealed no abnormalities. The time to cecum=4 minutes 31 seconds.  Withdrawal time=8 minutes 40 seconds. The scope was withdrawn and the procedure completed. COMPLICATIONS: There were no complications.  ENDOSCOPIC IMPRESSION: Two polyps were found, removed and sent to pathology. The examination was otherwise normal.  RECOMMENDATIONS: If the polyp(s) removed today are proven to  be adenomatous (pre-cancerous) polyps, you will need a repeat colonoscopy in 5 years.  You will receive a letter within 1-2 weeks with the results of your biopsy as well as final recommendations.  Please call my office if you have not received a letter after 3 weeks.   eSigned:  Milus Banister, MD 01/06/2014 8:45 AM   cc: Elsie Stain, MD

## 2014-01-06 NOTE — Progress Notes (Signed)
Called to room to assist during endoscopic procedure.  Patient ID and intended procedure confirmed with present staff. Received instructions for my participation in the procedure from the performing physician.  

## 2014-01-07 ENCOUNTER — Telehealth: Payer: Self-pay

## 2014-01-07 NOTE — Telephone Encounter (Signed)
  Follow up Call-  Call back number 01/06/2014  Post procedure Call Back phone  # [phone] Mechele Claude - wife cell  Permission to leave phone message Yes     Patient questions:  Do you have a fever, pain , or abdominal swelling? No. Pain Score  0 *  Have you tolerated food without any problems? Yes.    Have you been able to return to your normal activities? No.  Do you have any questions about your discharge instructions: Diet   No. Medications  No. Follow up visit  No.  Do you have questions or concerns about your Care? No.  Actions: * If pain score is 4 or above: No action needed, pain <4.

## 2014-01-18 ENCOUNTER — Encounter: Payer: Self-pay | Admitting: Gastroenterology

## 2014-01-25 ENCOUNTER — Other Ambulatory Visit: Payer: Self-pay | Admitting: Family Medicine

## 2014-01-25 NOTE — Telephone Encounter (Signed)
Received refill request electronically from pharmacy. Last refill 07/06/13 -10 ml/1 refill, last office visit  03/10/13. Is it okay to refill medication?

## 2014-01-25 NOTE — Telephone Encounter (Signed)
Patient advised to schedule CPE.

## 2014-01-25 NOTE — Telephone Encounter (Signed)
Faxed Rx

## 2014-01-25 NOTE — Telephone Encounter (Signed)
Printed please schedule a CPE.  Thanks

## 2014-03-02 ENCOUNTER — Other Ambulatory Visit: Payer: Self-pay | Admitting: Family Medicine

## 2014-03-02 DIAGNOSIS — R739 Hyperglycemia, unspecified: Secondary | ICD-10-CM

## 2014-03-02 DIAGNOSIS — E291 Testicular hypofunction: Secondary | ICD-10-CM

## 2014-03-02 DIAGNOSIS — E785 Hyperlipidemia, unspecified: Secondary | ICD-10-CM

## 2014-03-02 DIAGNOSIS — Z125 Encounter for screening for malignant neoplasm of prostate: Secondary | ICD-10-CM

## 2014-03-05 ENCOUNTER — Other Ambulatory Visit (INDEPENDENT_AMBULATORY_CARE_PROVIDER_SITE_OTHER): Payer: BC Managed Care – PPO

## 2014-03-05 DIAGNOSIS — R739 Hyperglycemia, unspecified: Secondary | ICD-10-CM

## 2014-03-05 DIAGNOSIS — E785 Hyperlipidemia, unspecified: Secondary | ICD-10-CM

## 2014-03-05 DIAGNOSIS — R7989 Other specified abnormal findings of blood chemistry: Secondary | ICD-10-CM

## 2014-03-05 DIAGNOSIS — R7309 Other abnormal glucose: Secondary | ICD-10-CM

## 2014-03-05 DIAGNOSIS — Z125 Encounter for screening for malignant neoplasm of prostate: Secondary | ICD-10-CM

## 2014-03-05 DIAGNOSIS — E291 Testicular hypofunction: Secondary | ICD-10-CM

## 2014-03-05 LAB — HEMOGLOBIN A1C: Hgb A1c MFr Bld: 5.6 % (ref 4.6–6.5)

## 2014-03-05 LAB — PSA: PSA: 0.51 ng/mL (ref 0.10–4.00)

## 2014-03-05 LAB — CBC WITH DIFFERENTIAL/PLATELET
BASOS PCT: 0.6 % (ref 0.0–3.0)
Basophils Absolute: 0.1 10*3/uL (ref 0.0–0.1)
Eosinophils Absolute: 0.3 10*3/uL (ref 0.0–0.7)
Eosinophils Relative: 3.5 % (ref 0.0–5.0)
HCT: 51.6 % (ref 39.0–52.0)
Hemoglobin: 17.3 g/dL — ABNORMAL HIGH (ref 13.0–17.0)
LYMPHS PCT: 38.6 % (ref 12.0–46.0)
Lymphs Abs: 3.4 10*3/uL (ref 0.7–4.0)
MCHC: 33.6 g/dL (ref 30.0–36.0)
MCV: 94.6 fl (ref 78.0–100.0)
Monocytes Absolute: 0.7 10*3/uL (ref 0.1–1.0)
Monocytes Relative: 7.7 % (ref 3.0–12.0)
Neutro Abs: 4.4 10*3/uL (ref 1.4–7.7)
Neutrophils Relative %: 49.6 % (ref 43.0–77.0)
Platelets: 195 10*3/uL (ref 150.0–400.0)
RBC: 5.46 Mil/uL (ref 4.22–5.81)
RDW: 13.6 % (ref 11.5–15.5)
WBC: 8.9 10*3/uL (ref 4.0–10.5)

## 2014-03-05 LAB — COMPREHENSIVE METABOLIC PANEL
ALK PHOS: 76 U/L (ref 39–117)
ALT: 33 U/L (ref 0–53)
AST: 31 U/L (ref 0–37)
Albumin: 4.3 g/dL (ref 3.5–5.2)
BILIRUBIN TOTAL: 0.9 mg/dL (ref 0.2–1.2)
BUN: 11 mg/dL (ref 6–23)
CALCIUM: 9.4 mg/dL (ref 8.4–10.5)
CO2: 28 mEq/L (ref 19–32)
Chloride: 104 mEq/L (ref 96–112)
Creatinine, Ser: 1 mg/dL (ref 0.4–1.5)
GFR: 84.51 mL/min (ref >60.00)
Glucose, Bld: 94 mg/dL (ref 70–99)
Potassium: 4.1 mEq/L (ref 3.5–5.1)
Sodium: 139 mEq/L (ref 135–145)
Total Protein: 7.1 g/dL (ref 6.0–8.3)

## 2014-03-05 LAB — LDL CHOLESTEROL, DIRECT: LDL DIRECT: 112.9 mg/dL

## 2014-03-05 LAB — LIPID PANEL
Cholesterol: 167 mg/dL (ref 0–200)
HDL: 31.5 mg/dL — ABNORMAL LOW (ref >39.00)
NonHDL: 135.5
Total CHOL/HDL Ratio: 5
Triglycerides: 218 mg/dL — ABNORMAL HIGH (ref 0.0–149.0)
VLDL: 43.6 mg/dL — ABNORMAL HIGH (ref 0.0–40.0)

## 2014-03-05 LAB — TESTOSTERONE: TESTOSTERONE: 1223.37 ng/dL — AB (ref 300.00–890.00)

## 2014-03-11 ENCOUNTER — Encounter: Payer: Self-pay | Admitting: Family Medicine

## 2014-03-11 ENCOUNTER — Ambulatory Visit (INDEPENDENT_AMBULATORY_CARE_PROVIDER_SITE_OTHER): Payer: BC Managed Care – PPO | Admitting: Family Medicine

## 2014-03-11 VITALS — BP 130/80 | HR 90 | Temp 98.3°F | Ht 73.0 in | Wt 242.0 lb

## 2014-03-11 DIAGNOSIS — Z7189 Other specified counseling: Secondary | ICD-10-CM | POA: Insufficient documentation

## 2014-03-11 DIAGNOSIS — Z0000 Encounter for general adult medical examination without abnormal findings: Secondary | ICD-10-CM

## 2014-03-11 DIAGNOSIS — E291 Testicular hypofunction: Secondary | ICD-10-CM

## 2014-03-11 DIAGNOSIS — Z23 Encounter for immunization: Secondary | ICD-10-CM

## 2014-03-11 DIAGNOSIS — E785 Hyperlipidemia, unspecified: Secondary | ICD-10-CM

## 2014-03-11 MED ORDER — TESTOSTERONE CYPIONATE 200 MG/ML IM SOLN: INTRAMUSCULAR | Status: DC

## 2014-03-11 MED ORDER — SILDENAFIL CITRATE 100 MG PO TABS: 50.0000 mg | ORAL_TABLET | Freq: Every day | ORAL | Status: DC | PRN

## 2014-03-11 NOTE — Assessment & Plan Note (Signed)
Routine anticipatory guidance given to patient.  See health maintenance. Tetanus 2015 Flu shot PNA not due.  Shingles d/w pt.  Due at 37.   PSA wnl. D/w pt.  Colonoscopy 2015 Living will d/w pt.  Prev done.  If incapacitated, would have his wife speak for him.  Diet and exercise d/w pt.  Doing well with both.

## 2014-03-11 NOTE — Progress Notes (Signed)
Pre visit review using our clinic review tool, if applicable. No additional management support is needed unless otherwise documented below in the visit note.  CPE- See plan.  Routine anticipatory guidance given to patient.  See health maintenance. Tetanus 2015 Flu shot PNA not due.  Shingles d/w pt.  Due at 37.   PSA wnl. D/w pt.  Colonoscopy 2015 Living will d/w pt.  Prev done.  If incapacitated, would have his wife speak for him.  Diet and exercise d/w pt.  Doing well with both.   Elevated Cholesterol: Using medications without problems:yes Muscle aches: not at current dose Diet compliance: yes Exercise:yes  H/o low T.  HGB up.  D/w pt.  Still with ED.  Couldn't get levitra recently, but it had worked [employer].  His level is up, that was a peak level.  D/w pt.  More energy, feels better.  Less groggy.   On 1.17ml  q14days.   PMH and SH reviewed  Meds, vitals, and allergies reviewed.   ROS: See HPI.  Otherwise negative.    GEN: nad, alert and oriented HEENT: mucous membranes moist NECK: supple w/o LA CV: rrr. PULM: ctab, no inc wob ABD: soft, +bs EXT: no edema SKIN: no acute rash

## 2014-03-11 NOTE — Patient Instructions (Signed)
Recheck a peak T level and CBC in about 2 months.  Cut back to 1.58mL every 14 days in the meantime.  Take care.  Glad to see you.

## 2014-03-11 NOTE — Assessment & Plan Note (Signed)
Reasonable control, at likely the max tolerated dose. Labs d/w pt.  Continue diet and exercise, no change in med.  He agrees.

## 2014-03-11 NOTE — Assessment & Plan Note (Addendum)
HGB up. D/w pt. Still with ED. Couldn't get levitra recently, but it had worked [employer]. D/w pt about change to viagra.  His T level is up now, that was a peak level. More energy, feels better. Less groggy. On 1.82ml q14days. He had a low trough at 1.48ml q14 days.  Would change to 1.98ml q14 days and recheck a peak level.  He doesn't appear to need phlebotomy now for the HGB level, will follow.  PSA and LFTs wnl.  Patient aware of risk/benefit with T treatment.

## 2014-03-14 ENCOUNTER — Other Ambulatory Visit: Payer: Self-pay | Admitting: Family Medicine

## 2014-03-15 ENCOUNTER — Other Ambulatory Visit: Payer: Self-pay | Admitting: Family Medicine

## 2014-03-15 NOTE — Telephone Encounter (Signed)
Pt left vm stating that Dr. Damita Dunnings was supposed to call in generic Viagra for him last week and that [place] states they never got it.  E-scribe on 03/11/14 states pharmacy did receive it.  I spoke with pharmacist who said that pt's insurance will only pay for 3 tablets and that would cost the patient $116.37.  She said that the generic did not come in 100mg  tablets.  When I asked her what mg tablets they offered, she said that generic would not be covered with pt's diagnosis.  Left vm for pt to return call.

## 2014-03-15 NOTE — Telephone Encounter (Signed)
Received refill request electronically from pharmacy. See allergy/contraindication. Is it okay to refill medication? 

## 2014-03-15 NOTE — Telephone Encounter (Signed)
Sent!

## 2014-03-16 NOTE — Telephone Encounter (Signed)
I sent it to [facility].  The patient will have to see what if covered and then notify us. Thanks.

## 2014-03-17 NOTE — Telephone Encounter (Signed)
Pt advised, he will call back with info from insurance company.

## 2014-03-18 MED ORDER — SILDENAFIL CITRATE 20 MG PO TABS: 60.0000 mg | ORAL_TABLET | Freq: Every day | ORAL | Status: DC | PRN

## 2014-03-18 NOTE — Telephone Encounter (Signed)
Spoke with patient and advised him of the deal at [facility] and he would like rx sent to [facility], please advise

## 2014-03-18 NOTE — Telephone Encounter (Signed)
Sent. Thanks.   

## 2014-03-30 ENCOUNTER — Emergency Department (HOSPITAL_COMMUNITY)
Admission: EM | Admit: 2014-03-30 | Discharge: 2014-03-30 | Disposition: A | Discharge status: Home / Self Care | Payer: BC Managed Care – PPO | Attending: Emergency Medicine | Admitting: Emergency Medicine

## 2014-03-30 ENCOUNTER — Encounter (HOSPITAL_COMMUNITY): Payer: Self-pay | Admitting: Emergency Medicine

## 2014-03-30 ENCOUNTER — Emergency Department (HOSPITAL_COMMUNITY): Payer: BC Managed Care – PPO

## 2014-03-30 DIAGNOSIS — N200 Calculus of kidney: Secondary | ICD-10-CM

## 2014-03-30 DIAGNOSIS — I861 Scrotal varices: Secondary | ICD-10-CM | POA: Insufficient documentation

## 2014-03-30 DIAGNOSIS — R011 Cardiac murmur, unspecified: Secondary | ICD-10-CM | POA: Insufficient documentation

## 2014-03-30 DIAGNOSIS — Z880 Allergy status to penicillin: Secondary | ICD-10-CM | POA: Diagnosis not present

## 2014-03-30 DIAGNOSIS — E785 Hyperlipidemia, unspecified: Secondary | ICD-10-CM | POA: Diagnosis not present

## 2014-03-30 DIAGNOSIS — Z8619 Personal history of other infectious and parasitic diseases: Secondary | ICD-10-CM | POA: Insufficient documentation

## 2014-03-30 DIAGNOSIS — Z1211 Encounter for screening for malignant neoplasm of colon: Secondary | ICD-10-CM | POA: Diagnosis not present

## 2014-03-30 DIAGNOSIS — Z7982 Long term (current) use of aspirin: Secondary | ICD-10-CM | POA: Insufficient documentation

## 2014-03-30 DIAGNOSIS — Z87891 Personal history of nicotine dependence: Secondary | ICD-10-CM | POA: Insufficient documentation

## 2014-03-30 DIAGNOSIS — Z8739 Personal history of other diseases of the musculoskeletal system and connective tissue: Secondary | ICD-10-CM | POA: Diagnosis not present

## 2014-03-30 DIAGNOSIS — R1032 Left lower quadrant pain: Secondary | ICD-10-CM | POA: Diagnosis present

## 2014-03-30 LAB — URINALYSIS, ROUTINE W REFLEX MICROSCOPIC
Bilirubin Urine: NEGATIVE
Glucose, UA: NEGATIVE mg/dL
Ketones, ur: NEGATIVE mg/dL
Leukocytes, UA: NEGATIVE
Nitrite: NEGATIVE
Protein, ur: NEGATIVE mg/dL
Specific Gravity, Urine: 1.018 (ref 1.005–1.030)
Urobilinogen, UA: 0.2 mg/dL (ref 0.0–1.0)
pH: 6 (ref 5.0–8.0)

## 2014-03-30 LAB — COMPREHENSIVE METABOLIC PANEL
ALT: 37 U/L (ref 0–53)
AST: 28 U/L (ref 0–37)
Albumin: 4.2 g/dL (ref 3.5–5.2)
Alkaline Phosphatase: 83 U/L (ref 39–117)
Anion gap: 14 (ref 5–15)
BUN: 18 mg/dL (ref 6–23)
CO2: 25 mEq/L (ref 19–32)
Calcium: 9.8 mg/dL (ref 8.4–10.5)
Chloride: 100 mEq/L (ref 96–112)
Creatinine, Ser: 1.35 mg/dL (ref 0.50–1.35)
GFR calc Af Amer: 67 mL/min — ABNORMAL LOW (ref >90)
GFR calc non Af Amer: 58 mL/min — ABNORMAL LOW (ref >90)
Glucose, Bld: 103 mg/dL — ABNORMAL HIGH (ref 70–99)
Potassium: 4.2 mEq/L (ref 3.7–5.3)
Sodium: 139 mEq/L (ref 137–147)
Total Bilirubin: 0.7 mg/dL (ref 0.3–1.2)
Total Protein: 7.5 g/dL (ref 6.0–8.3)

## 2014-03-30 LAB — CBC WITH DIFFERENTIAL/PLATELET
Basophils Absolute: 0 10*3/uL (ref 0.0–0.1)
Basophils Relative: 0 % (ref 0–1)
Eosinophils Absolute: 0.3 10*3/uL (ref 0.0–0.7)
Eosinophils Relative: 2 % (ref 0–5)
HCT: 48.9 % (ref 39.0–52.0)
Hemoglobin: 17 g/dL (ref 13.0–17.0)
Lymphocytes Relative: 25 % (ref 12–46)
Lymphs Abs: 3.6 10*3/uL (ref 0.7–4.0)
MCH: 31.3 pg (ref 26.0–34.0)
MCHC: 34.8 g/dL (ref 30.0–36.0)
MCV: 90.1 fL (ref 78.0–100.0)
Monocytes Absolute: 1.4 10*3/uL — ABNORMAL HIGH (ref 0.1–1.0)
Monocytes Relative: 10 % (ref 3–12)
Neutro Abs: 9.1 10*3/uL — ABNORMAL HIGH (ref 1.7–7.7)
Neutrophils Relative %: 63 % (ref 43–77)
Platelets: 180 10*3/uL (ref 150–400)
RBC: 5.43 MIL/uL (ref 4.22–5.81)
RDW: 12.9 % (ref 11.5–15.5)
WBC: 14.4 10*3/uL — ABNORMAL HIGH (ref 4.0–10.5)

## 2014-03-30 LAB — URINE MICROSCOPIC-ADD ON

## 2014-03-30 MED ORDER — TAMSULOSIN HCL 0.4 MG PO CAPS: 0.4000 mg | ORAL_CAPSULE | Freq: Every day | ORAL | Status: DC

## 2014-03-30 MED ORDER — OXYCODONE-ACETAMINOPHEN 5-325 MG PO TABS: 1.0000 | ORAL_TABLET | Freq: Four times a day (QID) | ORAL | Status: DC | PRN

## 2014-03-30 MED ORDER — ONDANSETRON 4 MG PO TBDP: 4.0000 mg | ORAL_TABLET | Freq: Three times a day (TID) | ORAL | Status: DC | PRN

## 2014-03-30 NOTE — Discharge Instructions (Signed)
Kidney Stones Kidney stones (urolithiasis) are solid masses that form inside your kidneys. The intense pain is caused by the stone moving through the kidney, ureter, bladder, and urethra (urinary tract). When the stone moves, the ureter starts to spasm around the stone. The stone is usually passed in your pee (urine).  HOME CARE  Drink enough fluids to keep your pee clear or pale yellow. This helps to get the stone out.  Strain all pee through the provided strainer. Do not pee without peeing through the strainer, not even once. If you pee the stone out, catch it in the strainer. The stone may be as small as a grain of salt. Take this to your doctor. This will help your doctor figure out what you can do to try to prevent more kidney stones.  Only take medicine as told by your doctor.  Follow up with your doctor as told.  Get follow-up X-rays as told by your doctor. GET HELP IF: You have pain that gets worse even if you have been taking pain medicine. GET HELP RIGHT AWAY IF:   Your pain does not get better with medicine.  You have a fever or shaking chills.  Your pain increases and gets worse over 18 hours.  You have new belly (abdominal) pain.  You feel faint or pass out.  You are unable to pee. MAKE SURE YOU:   Understand these instructions.  Will watch your condition.  Will get help right away if you are not doing well or get worse. Document Released: 11/28/2007 Document Revised: 02/11/2013 Document Reviewed: 11/12/2012 [facility] Patient Information 2015 [place], [place]. This information is not intended to replace advice given to you by your health care provider. Make sure you discuss any questions you have with your health care provider. You have a 18mm stone  You have been given prescriptions for pain , nausea control and one called Flomax that helps dilated the ureter and allows the stone to pass  If you can not control you pain at home with these medication please go to  [facility] for further evaluation and possible admission

## 2014-03-30 NOTE — ED Provider Notes (Signed)
CSN: [id number]     Arrival date & time 03/30/14  0000 History   First MD Initiated Contact with Patient 03/30/14 0116     Chief Complaint  Patient presents with  . Abdominal Pain     (Consider location/radiation/quality/duration/timing/severity/associated sxs/prior Treatment) HPI Comments: Is a 53 year old gentleman, who comes in with one day of waxing and waning.  Left flank pain, radiating to his left lower quadrant.  He, states when the pain is at its peak.  He has slight nausea, but no vomiting.  Denies any diarrhea, fever, previous history of same.   Patient is a 53 y.o. male presenting with abdominal pain. The history is provided by the patient.  Abdominal Pain Pain location:  L flank and LLQ Pain quality: aching   Pain radiates to:  Does not radiate Pain severity:  Severe Onset quality:  Sudden Progression:  Waxing and waning Chronicity:  New Relieved by:  Nothing Worsened by:  Nothing tried Ineffective treatments:  None tried Associated symptoms: hematuria and nausea   Associated symptoms: no diarrhea, no dysuria, no fever and no vomiting     Past Medical History  Diagnosis Date  . Low testosterone late 2010    on therapy since ~ 09/2009  . Varicocele 03/2010    Left sided, Uro eval   . Erectile dysfunction     Non organic  . Special screening for malignant neoplasms, colon   . Hypogonadism male     Previously evaluated by Dr. Reece Agar with URO for benign noduarity in left hemiscrotum (2008) felt to be variant of normal.  . Plantar fasciitis   . Family history of diabetes mellitus   . Nonspecific reaction to tuberculin skin test without active tuberculosis   . Heart murmur     previously evaluated, mitral valve prolapse - No prophylaxis ABX needed per MD eval.  . History of chicken pox   . Hyperlipidemia   . 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 Nasal septum surgery  2003  .  Refractive surgery  1994  . Unremarkable follow up scrotal US per uro  2011  . Cystectomy  2008    back   Family History  Problem Relation Age of Onset  . Cancer Mother     Renal CA, treated 1993  . Parkinsonism Father   . Alzheimer's disease Father   . Diabetes Brother   . Diabetes Other   . Kidney disease Other   . Colon cancer Neg Hx   . Prostate cancer Neg Hx   . Esophageal cancer Neg Hx   . Rectal cancer Neg Hx   . Stomach cancer Neg Hx   . Pancreatic cancer Neg Hx    History  Substance Use Topics  . Smoking status: Former Smoker    Types: Cigarettes    Quit date: 10/29/2008  . Smokeless tobacco: Never Used  . Alcohol Use: Yes     Comment: Rare    Review of Systems  Constitutional: Negative for fever.  Gastrointestinal: Positive for nausea and abdominal pain. Negative for vomiting and diarrhea.  Genitourinary: Positive for hematuria and flank pain. Negative for dysuria and frequency.  Neurological: Negative for headaches.  All other systems reviewed and are negative.     Allergies  Amoxicillin; Erythromycin; Lipitor; Naproxen sodium; and Penicillins  Home Medications   Prior to Admission medications   Medication Sig Start Date End Date Taking? Authorizing Provider  aspirin 81 MG tablet Take 81 mg by mouth daily.     Yes Historical Provider, MD  atorvastatin (LIPITOR) 20 MG tablet Take 10 mg by mouth daily.   Yes Historical Provider, MD  ranitidine (ZANTAC) 150 MG tablet Take 150 mg by mouth daily as needed for heartburn.   Yes Historical Provider, MD  testosterone cypionate (DEPOTESTOTERONE CYPIONATE) 200 MG/ML injection Inject 320 mg into the muscle every 14 (fourteen) days.   Yes Historical Provider, MD  valACYclovir (VALTREX) 500 MG tablet Take 500 mg by mouth daily.   Yes Historical Provider, MD  vitamin E 200 UNIT capsule Take 200 Units by mouth 3 (three) times a week. Take one by mouth 3 times a week   Yes Historical Provider, MD  ondansetron (ZOFRAN  ODT) 4 MG disintegrating tablet Take 1 tablet (4 mg total) by mouth every 8 (eight) hours as needed for nausea or vomiting. 03/30/14   Garald Balding, NP  oxyCODONE-acetaminophen (PERCOCET/ROXICET) 5-325 MG per tablet Take 1-2 tablets by mouth every 6 (six) hours as needed for severe pain. 03/30/14   Garald Balding, NP  tamsulosin (FLOMAX) 0.4 MG CAPS capsule Take 1 capsule (0.4 mg total) by mouth daily. 03/30/14   Garald Balding, NP   BP 134/69  Pulse 76  Temp(Src) 97.9 F (36.6 C) (Oral)  Resp 18  Ht 6\' 2"  (1.88 m)  Wt 242 lb (109.77 kg)  BMI 31.06 kg/m2  SpO2 95% Physical Exam  Nursing note and vitals reviewed. Constitutional: He appears well-developed and well-nourished.  HENT:  Head: Normocephalic.  Eyes: Pupils are equal, round, and reactive to light.  Neck: Normal range of motion.  Cardiovascular: Normal rate.   Pulmonary/Chest: Effort normal.  Abdominal: Soft. He exhibits no distension.  Musculoskeletal: Normal range of motion.  Neurological: He is alert.  Skin: Skin is warm. No erythema.    ED Course  Procedures (including critical care time) Labs Review Labs Reviewed  CBC WITH DIFFERENTIAL - Abnormal; Notable for the following:    WBC 14.4 (*)    Neutro Abs 9.1 (*)    Monocytes Absolute 1.4 (*)    All other components within normal limits  COMPREHENSIVE METABOLIC PANEL - Abnormal; Notable for the following:    Glucose, Bld 103 (*)    GFR calc non Af Amer 58 (*)    GFR calc Af Amer 67 (*)    All other components within normal limits  URINALYSIS, ROUTINE W REFLEX MICROSCOPIC - Abnormal; Notable for the following:    Hgb urine dipstick MODERATE (*)    All other components within normal limits  URINE MICROSCOPIC-ADD ON - Abnormal; Notable for the following:    Squamous Epithelial / LPF FEW (*)    Bacteria, UA FEW (*)    Casts HYALINE CASTS (*)    All other components within normal limits    Imaging Review Ct Renal Stone Study  03/30/2014   CLINICAL DATA:  Initial  encounter for left lower quadrant abdominal pain and flank pain beginning yesterday  EXAM: CT ABDOMEN AND PELVIS WITHOUT CONTRAST  TECHNIQUE: Multidetector CT imaging of the abdomen and pelvis was performed following the standard protocol without IV contrast.  COMPARISON:  CT abdomen and pelvis 11/29/2010  FINDINGS: Minimal dependent atelectasis is present on the left. The lungs are otherwise clear. The heart size is normal. No significant pleural or pericardial effusion is present.  Diffuse fatty infiltration of the liver is again noted. The spleen is within normal limits. Stomach,  duodenum, and pancreas are unremarkable. Common bile duct and gallbladder are within normal limits. There is some fatty sparing about the gallbladder fossa. The adrenal glands are normal bilaterally. The right kidney and ureter are normal.  The 5 mm obstructing left UPJ stone is evident. There is mild to moderate left-sided hydronephrosis with extensive perinephric stranding. An additional punctate nonobstructing stone is noted at the lower pole of the left kidney. The more distal left ureter is unremarkable. The urinary bladder is within normal limits.  The rectosigmoid colon is within normal limits. The remainder of the colon is unremarkable. The appendix is visualized and normal. The small bowel is within normal limits.  Bone windows demonstrate Schmorl's nodes at L2-3 and L4-5. There is a vacuum disc at L5-S1. No focal lytic or blastic lesions are evident.  IMPRESSION: 1. Obstructing 5 mm left UPJ stone with mild to moderate left-sided hydronephrosis and extensive perinephric stranding. 2. Additional punctate nonobstructing stone at the lower pole of the left kidney. 3. Diffuse fatty infiltration of the liver is stable.   Electronically Signed   By: Lawrence Santiago M.D.   On: 03/30/2014 02:45     EKG Interpretation None     [facility] colonoscopy in July, 2015 with 2 polyps removed that were precancerous no note of diverticular  disease, on Dr. Ardis Hughs.  Colonoscopy report. Patient states he has persistent microscopic hematuria.  That has been investigated numerous times. Patient symptoms are suspicious for kidney stone  MDM   Final diagnoses:  Kidney stone         Garald Balding, NP 03/30/14 2052

## 2014-03-30 NOTE — ED Notes (Signed)
PA Delena Bali at bedside reviewing discharge with the patient.

## 2014-03-30 NOTE — ED Notes (Signed)
Pt. reports LLQ and left flank pain onset yesterday , denies nausea or vomitting , no fever or chills. Denies urinary discomfort.

## 2014-03-31 NOTE — ED Provider Notes (Signed)
Medical screening examination/treatment/procedure(s) were performed by non-physician practitioner and as supervising physician I was immediately available for consultation/collaboration.   EKG Interpretation None        Everlene Balls, MD 03/31/14 [phone]

## 2014-04-06 ENCOUNTER — Other Ambulatory Visit: Payer: Self-pay | Admitting: Urology

## 2014-04-14 ENCOUNTER — Encounter (HOSPITAL_BASED_OUTPATIENT_CLINIC_OR_DEPARTMENT_OTHER): Payer: Self-pay | Admitting: *Deleted

## 2014-04-15 ENCOUNTER — Encounter (HOSPITAL_BASED_OUTPATIENT_CLINIC_OR_DEPARTMENT_OTHER): Payer: Self-pay | Admitting: *Deleted

## 2014-04-15 NOTE — Progress Notes (Signed)
NPO AFTER MN. ARRIVE AT 7703. CURRENT LAB RESULTS IN CHART AND EPIC. WILL TAKE ZANTAC, FLOMAX , AND IF NEEDED PAIN/ NAUSEA RX AM DOS W/ SIPS OF WATER.

## 2014-04-15 NOTE — Progress Notes (Signed)
04/15/14 1013  OBSTRUCTIVE SLEEP APNEA  Have you ever been diagnosed with sleep apnea through a sleep study? No  Do you snore loudly (loud enough to be heard through closed doors)?  1  Do you often feel tired, fatigued, or sleepy during the daytime? 0  Has anyone observed you stop breathing during your sleep? 0  Do you have, or are you being treated for high blood pressure? 0  BMI more than 35 kg/m2? 0  Age over 54 years old? 1  Neck circumference greater than 40 cm/16 inches? 1  Gender: 1  Obstructive Sleep Apnea Score 4  Score 4 or greater  Results sent to PCP

## 2014-04-19 ENCOUNTER — Encounter (HOSPITAL_BASED_OUTPATIENT_CLINIC_OR_DEPARTMENT_OTHER)
Admission: RE | Disposition: A | Discharge status: Home / Self Care | Payer: Self-pay | Source: Ambulatory Visit | Attending: Urology

## 2014-04-19 ENCOUNTER — Encounter (HOSPITAL_BASED_OUTPATIENT_CLINIC_OR_DEPARTMENT_OTHER): Payer: Self-pay | Admitting: Anesthesiology

## 2014-04-19 ENCOUNTER — Encounter (HOSPITAL_BASED_OUTPATIENT_CLINIC_OR_DEPARTMENT_OTHER): Payer: BC Managed Care – PPO | Admitting: Anesthesiology

## 2014-04-19 ENCOUNTER — Ambulatory Visit (HOSPITAL_BASED_OUTPATIENT_CLINIC_OR_DEPARTMENT_OTHER): Payer: BC Managed Care – PPO | Admitting: Anesthesiology

## 2014-04-19 ENCOUNTER — Ambulatory Visit (HOSPITAL_BASED_OUTPATIENT_CLINIC_OR_DEPARTMENT_OTHER)
Admission: RE | Admit: 2014-04-19 | Discharge: 2014-04-19 | Disposition: A | Discharge status: Home / Self Care | Payer: BC Managed Care – PPO | Source: Ambulatory Visit | Attending: Urology | Admitting: Urology

## 2014-04-19 DIAGNOSIS — N1330 Unspecified hydronephrosis: Secondary | ICD-10-CM | POA: Insufficient documentation

## 2014-04-19 DIAGNOSIS — R011 Cardiac murmur, unspecified: Secondary | ICD-10-CM | POA: Diagnosis not present

## 2014-04-19 DIAGNOSIS — N201 Calculus of ureter: Secondary | ICD-10-CM | POA: Diagnosis not present

## 2014-04-19 DIAGNOSIS — F17210 Nicotine dependence, cigarettes, uncomplicated: Secondary | ICD-10-CM | POA: Diagnosis not present

## 2014-04-19 DIAGNOSIS — E780 Pure hypercholesterolemia: Secondary | ICD-10-CM | POA: Insufficient documentation

## 2014-04-19 DIAGNOSIS — N529 Male erectile dysfunction, unspecified: Secondary | ICD-10-CM | POA: Diagnosis not present

## 2014-04-19 HISTORY — DX: Other specified personal risk factors, not elsewhere classified: Z91.89

## 2014-04-19 HISTORY — DX: Personal history of other medical treatment: Z92.89

## 2014-04-19 HISTORY — DX: Gastro-esophageal reflux disease without esophagitis: K21.9

## 2014-04-19 HISTORY — DX: Cyst of epididymis: N50.3

## 2014-04-19 HISTORY — PX: CYSTOSCOPY WITH RETROGRADE PYELOGRAM, URETEROSCOPY AND STENT PLACEMENT: SHX5789

## 2014-04-19 HISTORY — DX: Personal history of colonic polyps: Z86.010

## 2014-04-19 HISTORY — DX: Calculus of kidney: N20.0

## 2014-04-19 HISTORY — PX: HOLMIUM LASER APPLICATION: SHX5852

## 2014-04-19 SURGERY — CYSTOURETEROSCOPY, WITH RETROGRADE PYELOGRAM AND STENT INSERTION
Anesthesia: General | Site: Ureter | Laterality: Left

## 2014-04-19 MED ORDER — URELLE 81 MG PO TABS
1.0000 | ORAL_TABLET | Freq: Four times a day (QID) | ORAL | Status: DC
  Administered 2014-04-19: 81 mg via ORAL
  Filled 2014-04-19: qty 1

## 2014-04-19 MED ORDER — CIPROFLOXACIN IN D5W 400 MG/200ML IV SOLN
400.0000 mg | INTRAVENOUS | Status: AC
  Administered 2014-04-19: 400 mg via INTRAVENOUS
  Filled 2014-04-19: qty 200

## 2014-04-19 MED ORDER — CIPROFLOXACIN IN D5W 400 MG/200ML IV SOLN
INTRAVENOUS | Status: AC
  Filled 2014-04-19: qty 200

## 2014-04-19 MED ORDER — LIDOCAINE HCL (CARDIAC) 20 MG/ML IV SOLN
INTRAVENOUS | Status: DC | PRN
  Administered 2014-04-19: 100 mg via INTRAVENOUS

## 2014-04-19 MED ORDER — URELLE 81 MG PO TABS
ORAL_TABLET | ORAL | Status: AC
  Filled 2014-04-19: qty 1

## 2014-04-19 MED ORDER — PROPOFOL 10 MG/ML IV BOLUS
INTRAVENOUS | Status: DC | PRN
  Administered 2014-04-19: 200 mg via INTRAVENOUS

## 2014-04-19 MED ORDER — LACTATED RINGERS IV SOLN
INTRAVENOUS | Status: DC
  Administered 2014-04-19 (×2): via INTRAVENOUS
  Filled 2014-04-19: qty 1000

## 2014-04-19 MED ORDER — SODIUM CHLORIDE 0.9 % IR SOLN
Status: DC | PRN
  Administered 2014-04-19: 6000 mL
  Administered 2014-04-19: 500 mL

## 2014-04-19 MED ORDER — TAMSULOSIN HCL 0.4 MG PO CAPS: 0.4000 mg | ORAL_CAPSULE | Freq: Every day | ORAL | Status: DC

## 2014-04-19 MED ORDER — MIDAZOLAM HCL 2 MG/2ML IJ SOLN
INTRAMUSCULAR | Status: AC
  Filled 2014-04-19: qty 2

## 2014-04-19 MED ORDER — UROGESIC-BLUE 81.6 MG PO TABS: 81.6000 mg | ORAL_TABLET | Freq: Four times a day (QID) | ORAL | Status: DC | PRN

## 2014-04-19 MED ORDER — IOHEXOL 350 MG/ML SOLN
INTRAVENOUS | Status: DC | PRN
  Administered 2014-04-19: 5 mL

## 2014-04-19 MED ORDER — ACETAMINOPHEN 10 MG/ML IV SOLN
1000.0000 mg | Freq: Once | INTRAVENOUS | Status: AC
  Administered 2014-04-19: 1000 mg via INTRAVENOUS
  Filled 2014-04-19: qty 100

## 2014-04-19 MED ORDER — FENTANYL CITRATE 0.05 MG/ML IJ SOLN
INTRAMUSCULAR | Status: DC | PRN
  Administered 2014-04-19: 25 ug via INTRAVENOUS
  Administered 2014-04-19 (×2): 50 ug via INTRAVENOUS
  Administered 2014-04-19: 25 ug via INTRAVENOUS

## 2014-04-19 MED ORDER — LIDOCAINE HCL 2 % EX GEL
CUTANEOUS | Status: DC | PRN
  Administered 2014-04-19: 1

## 2014-04-19 MED ORDER — HYDROCODONE-ACETAMINOPHEN 5-300 MG PO TABS: 5.0000 mg | ORAL_TABLET | Freq: Four times a day (QID) | ORAL | Status: DC | PRN

## 2014-04-19 MED ORDER — MIDAZOLAM HCL 5 MG/5ML IJ SOLN
INTRAMUSCULAR | Status: DC | PRN
  Administered 2014-04-19: 2 mg via INTRAVENOUS

## 2014-04-19 MED ORDER — LACTATED RINGERS IV SOLN
INTRAVENOUS | Status: DC
  Filled 2014-04-19: qty 1000

## 2014-04-19 MED ORDER — FENTANYL CITRATE 0.05 MG/ML IJ SOLN
INTRAMUSCULAR | Status: AC
  Filled 2014-04-19: qty 4

## 2014-04-19 MED ORDER — FENTANYL CITRATE 0.05 MG/ML IJ SOLN
25.0000 ug | INTRAMUSCULAR | Status: DC | PRN
  Filled 2014-04-19: qty 1

## 2014-04-19 MED ORDER — KETOROLAC TROMETHAMINE 30 MG/ML IJ SOLN
30.0000 mg | Freq: Once | INTRAMUSCULAR | Status: DC
  Filled 2014-04-19: qty 1

## 2014-04-19 MED ORDER — ONDANSETRON HCL 4 MG/2ML IJ SOLN
INTRAMUSCULAR | Status: DC | PRN
  Administered 2014-04-19: 4 mg via INTRAVENOUS

## 2014-04-19 MED ORDER — DEXAMETHASONE SODIUM PHOSPHATE 4 MG/ML IJ SOLN
INTRAMUSCULAR | Status: DC | PRN
  Administered 2014-04-19: 10 mg via INTRAVENOUS

## 2014-04-19 MED ORDER — KETOROLAC TROMETHAMINE 30 MG/ML IJ SOLN
INTRAMUSCULAR | Status: DC | PRN
  Administered 2014-04-19: 30 mg via INTRAVENOUS

## 2014-04-19 MED ORDER — TRIMETHOPRIM 100 MG PO TABS: 100.0000 mg | ORAL_TABLET | ORAL | Status: DC

## 2014-04-19 SURGICAL SUPPLY — 35 items
ADAPTER CATH URET PLST 4-6FR (CATHETERS) IMPLANT
BAG DRAIN URO-CYSTO SKYTR STRL (DRAIN) ×2 IMPLANT
BASKET LASER NITINOL 1.9FR (BASKET) IMPLANT
BASKET STNLS GEMINI 4WIRE 3FR (BASKET) IMPLANT
BASKET ZERO TIP NITINOL 2.4FR (BASKET) ×2 IMPLANT
BOOTIES KNEE HIGH SLOAN (MISCELLANEOUS) IMPLANT
CANISTER SUCT LVC 12 LTR MEDI- (MISCELLANEOUS) ×2 IMPLANT
CATH CLEAR GEL 3F BACKSTOP (CATHETERS) ×2 IMPLANT
CATH INTERMIT  6FR 70CM (CATHETERS) ×2 IMPLANT
CATH URET 5FR 28IN CONE TIP (BALLOONS)
CATH URET 5FR 28IN OPEN ENDED (CATHETERS) IMPLANT
CATH URET 5FR 70CM CONE TIP (BALLOONS) IMPLANT
CATH URET DUAL LUMEN 6-10FR 50 (CATHETERS) IMPLANT
CLOTH BEACON ORANGE TIMEOUT ST (SAFETY) ×2 IMPLANT
DRAPE CAMERA CLOSED 9X96 (DRAPES) ×2 IMPLANT
ELECT REM PT RETURN 9FT ADLT (ELECTROSURGICAL)
ELECTRODE REM PT RTRN 9FT ADLT (ELECTROSURGICAL) IMPLANT
FIBER LASER FLEXIVA 365 (UROLOGICAL SUPPLIES) ×2 IMPLANT
GLOVE BIO SURGEON STRL SZ 6.5 (GLOVE) ×2 IMPLANT
GLOVE BIO SURGEON STRL SZ7 (GLOVE) ×2 IMPLANT
GLOVE BIOGEL PI IND STRL 6.5 (GLOVE) ×1 IMPLANT
GLOVE BIOGEL PI INDICATOR 6.5 (GLOVE) ×1
GOWN STRL REUS W/ TWL XL LVL3 (GOWN DISPOSABLE) ×2 IMPLANT
GOWN STRL REUS W/TWL XL LVL3 (GOWN DISPOSABLE) ×2
GUIDEWIRE 0.038 PTFE COATED (WIRE) IMPLANT
GUIDEWIRE ANG ZIPWIRE 038X150 (WIRE) IMPLANT
GUIDEWIRE STR DUAL SENSOR (WIRE) ×2 IMPLANT
IV NS IRRIG 3000ML ARTHROMATIC (IV SOLUTION) ×4 IMPLANT
KIT BALLIN UROMAX 15FX10 (LABEL) IMPLANT
KIT BALLN UROMAX 15FX4 (MISCELLANEOUS) IMPLANT
KIT BALLN UROMAX 26 75X4 (MISCELLANEOUS)
SET HIGH PRES BAL DIL (LABEL)
SHEATH ACCESS URETERAL 38CM (SHEATH) IMPLANT
SHEATH ACCESS URETERAL 54CM (SHEATH) IMPLANT
STENT URET 6FRX26 CONTOUR (STENTS) ×2 IMPLANT

## 2014-04-19 NOTE — Anesthesia Preprocedure Evaluation (Addendum)
Anesthesia Evaluation  Patient identified by MRN, date of birth, ID band Patient awake    Reviewed: Allergy & Precautions, H&P , NPO status , Patient's Chart, lab work & pertinent test results  Airway Mallampati: II  TM Distance: >3 FB Neck ROM: full    Dental no notable dental hx. (+) Dental Advisory Given, Teeth Intact   Pulmonary neg pulmonary ROS, former smoker,  Stop bang 4 breath sounds clear to auscultation  Pulmonary exam normal       Cardiovascular Exercise Tolerance: Good negative cardio ROS  + Valvular Problems/Murmurs MVP Rhythm:regular Rate:Normal  Mild MVP   Neuro/Psych negative neurological ROS  negative psych ROS   GI/Hepatic negative GI ROS, Neg liver ROS, GERD-  Medicated and Controlled,  Endo/Other  negative endocrine ROS  Renal/GU negative Renal ROS  negative genitourinary   Musculoskeletal   Abdominal   Peds  Hematology negative hematology ROS (+)   Anesthesia Other Findings   Reproductive/Obstetrics negative OB ROS                             Anesthesia Physical Anesthesia Plan  ASA: II  Anesthesia Plan: General   Post-op Pain Management:    Induction: Intravenous  Airway Management Planned: LMA  Additional Equipment:   Intra-op Plan:   Post-operative Plan:   Informed Consent: I have reviewed the patients History and Physical, chart, labs and discussed the procedure including the risks, benefits and alternatives for the proposed anesthesia with the patient or authorized representative who has indicated his/her understanding and acceptance.   Dental Advisory Given  Plan Discussed with: CRNA and Surgeon  Anesthesia Plan Comments:         Anesthesia Quick Evaluation

## 2014-04-19 NOTE — Anesthesia Postprocedure Evaluation (Signed)
  Anesthesia Post-op Note  Patient: Steven [name]  Procedure(s) Performed: Procedure(s) (LRB): CYSTOSCOPY WITH RETROGRADE PYELOGRAM with interpretation, URETEROSCOPY, stone extraction with basket, AND STENT PLACEMENT, implantation of backstop (Left) HOLMIUM LASER APPLICATION (Left)  Patient Location: PACU  Anesthesia Type: General  Level of Consciousness: awake and alert   Airway and Oxygen Therapy: Patient Spontanous Breathing  Post-op Pain: mild  Post-op Assessment: Post-op Vital signs reviewed, Patient's Cardiovascular Status Stable, Respiratory Function Stable, Patent Airway and No signs of Nausea or vomiting  Last Vitals:  Filed Vitals:   04/19/14 1145  BP: 129/76  Pulse: 80  Temp:   Resp: 13    Post-op Vital Signs: stable   Complications: No apparent anesthesia complications

## 2014-04-19 NOTE — H&P (Signed)
Reason For Visit 1 week f/u & KUB   Active Problems Problems  1. Left ureteral stone (N20.1)   Assessed By: Carolan Clines (Urology); Last Assessed: 06 Apr 2014  History of Present Illness     53 YO male returns today for a 1 week f/u & KUB for hx of a 68mm Lt mid ureteral stone. He has previously been seen in the ER on 03/30/14 for LLQ abdominal pain & Lt flank pain that started yesterday. CT showed a 60mm Lt UPJ stone with mild/moderate hydronephrosis & extensive perinephric stranding. He also has a punctate Lt lower pole stone.    Previous hx of :   1. Hypogonadal, Rx Dr. Damita Dunnings 11/2cc q 14 days injection (per wife).   2. Hx microhematuria  3. L testis cyst.   He has a hx of 3-4 Dt. Mt. [employer]/day- but none now. IPSS=5, now -9. PSA per Dr. Damita Dunnings. No tobacco x 5 years.      Has h/o right scrotal and groin pain in October 2011. He previously saw Dr. Reece Agar. In 1994 he had a mild possible fracture of his penis and some left scrotal pain that resolved uneventfully. He had a negative CT and cystoscopy June 2004 for microscopic hematuria. He last saw Dr. Reece Agar in August of 2008, again for microscopic hematuria. He's had a varicocele on the left for quite some time. A scrotal ultrasound was done and 1994 and the testes were described as bilaterally homogeneous. His family physician, Dr. Elsie Stain, has been giving him a low testosterone injections monthly at 1 1/4 cc and his peak level on this dosage was 279 October 2011. He also has some erectile dysfunction as well. He had a scrotal ultrasound October 2011 which showed some vague inhomogeneous areas of the left testis but his serum tumor markers were negative, and he comes in for another scrotal ultrasound today. Both he and his wife have felt a small nodule in his left scrotum for 10 years, but when he was here October 2011 he could not find this. He can now find this nodule.   Past Medical History Problems  1. History of  Cluster Headache 2. History of hypercholesterolemia (Z86.39) 3. History of Murmur (R01.1)  Surgical History Problems  1. History of Dermatological Surgery 2. History of Septoplasty  Current Meds 1. Aspirin 81 MG Oral Tablet;  Therapy: (Recorded:15Aug2008) to Recorded 2. Depo-Testosterone 200 MG/ML Intramuscular Oil;  Therapy: (Recorded:17Oct2011) to Recorded 3. Levitra 10 MG Oral Tablet;  Therapy: [id number] to Recorded 4. Lipitor 10 MG Oral Tablet;  Therapy: (Recorded:24Dec2014) to Recorded  Allergies Medication  1. Amoxicillin TABS 2. Erythromycin TABS 3. Penicillins  Family History Problems  1. Family history of Alzheimer's Disease : Father 2. Family history of Death In The Family Father : Father   Deceased at age 71 - Parkinson's 63. Family history of Family Health Status - Mother's Age : Mother   85 4. Family history of Family Health Status Number Of Children   3 girls [id number]) 5. Family history of Kidney Cancer : Mother 6. Family history of Parkinson's Disease : Father  Social History Problems  1. Alcohol Use   1 2. Caffeine Use   6 3. Current every day smoker (F17.200) 4. Marital History - Currently Married 5. Occupation:   Engineer, maintenance (IT) 6. Tobacco Use   Has smoked 1 1/2 packs per day for 30 yrs 7. History of Tobacco Use   quit smoking 5/10  Review of Systems Genitourinary, constitutional, skin, eye, otolaryngeal,  hematologic/lymphatic, cardiovascular, pulmonary, endocrine, musculoskeletal, gastrointestinal, neurological and psychiatric system(s) were reviewed and pertinent findings if present are noted.  Gastrointestinal: flank pain and abdominal pain.  Other Symptoms: L epididymal cyst.    Vitals Vital Signs [Data Includes: Last 1 Day]  Recorded: 13Oct2015 10:32AM  Blood Pressure: 135 / 87 Temperature: 97.2 F Heart Rate: 84  Physical Exam Constitutional: Well nourished and well developed . No acute distress.  ENT:. The ears and nose are normal  in appearance.  Neck: The appearance of the neck is normal and no neck mass is present.  Pulmonary: No respiratory distress and normal respiratory rhythm and effort.  Cardiovascular:. No peripheral edema.  Abdomen: The abdomen is not distended. The abdomen is soft and nontender. No masses are palpated. No CVA tenderness. No hernias are palpable. No hepatosplenomegaly noted.  Genitourinary: Examination of the penis demonstrates no discharge, no masses, no lesions and a normal meatus. The scrotum is without lesions. The right epididymis is palpably normal and non-tender. The left epididymis is palpably normal and non-tender. The right testis is palpably normal, non-tender and without masses. The left testis is normal, non-tender and without masses.  Lymphatics: The femoral and inguinal nodes are not enlarged or tender.  Skin: Normal skin turgor, no visible rash and no visible skin lesions.  Neuro/Psych:. Mood and affect are appropriate.    Results/Data Urine [Data Includes: Last 1 Day]   13Oct2015  COLOR YELLOW   APPEARANCE CLEAR   SPECIFIC GRAVITY <1.005   pH 6.0   GLUCOSE NEG mg/dL  BILIRUBIN NEG   KETONE NEG mg/dL  BLOOD LARGE   PROTEIN NEG mg/dL  UROBILINOGEN 0.2 mg/dL  NITRITE NEG   LEUKOCYTE ESTERASE NEG   SQUAMOUS EPITHELIAL/HPF NONE SEEN   WBC 0-2 WBC/hpf  RBC TNTC RBC/hpf  BACTERIA RARE   CRYSTALS NONE SEEN   CASTS NONE SEEN    KUB: stone ( ?) seen in LLQ, in line with lower ureter on KUB.   Assessment Assessed  1. Left ureteral stone (N20.1)  50mm stone in LL ureter, with continued microhematuria. He has discomfort in the L testis, but no bladder symptoms. The KUB seems to show sa++ consistant with stone in the LL ureter, and I have advised Mr. Shenberger to go ahead and consider ureteroscopy and possible laser fractionation of stone to remove the stone, as an outpatient at [facility].   Plan Schedule surgical intervention.   Signatures Electronically signed by : Carolan Clines, M.D.; Apr 06 2014 10:45AM EST

## 2014-04-19 NOTE — Discharge Instructions (Addendum)
Kidney Stones °Kidney stones (urolithiasis) are solid masses that form inside your kidneys. The intense pain is caused by the stone moving through the kidney, ureter, bladder, and urethra (urinary tract). When the stone moves, the ureter starts to spasm around the stone. The stone is usually passed in your pee (urine).  °HOME CARE °· Drink enough fluids to keep your pee clear or pale yellow. This helps to get the stone out. °· Strain all pee through the provided strainer. Do not pee without peeing through the strainer, not even once. If you pee the stone out, catch it in the strainer. The stone may be as small as a grain of salt. Take this to your doctor. This will help your doctor figure out what you can do to try to prevent more kidney stones. °· Only take medicine as told by your doctor. °· Follow up with your doctor as told. °· Get follow-up X-rays as told by your doctor. °GET HELP IF: °You have pain that gets worse even if you have been taking pain medicine. °GET HELP RIGHT AWAY IF:  °· Your pain does not get better with medicine. °· You have a fever or shaking chills. °· Your pain increases and gets worse over 18 hours. °· You have new belly (abdominal) pain. °· You feel faint or pass out. °· You are unable to pee. °MAKE SURE YOU:  °· Understand these instructions. °· Will watch your condition. °· Will get help right away if you are not doing well or get worse. °Document Released: 11/28/2007 Document Revised: 02/11/2013 Document Reviewed: 11/12/2012 °[employer]® Patient Information ©2015 [employer]. This information is not intended to replace advice given to you by your health care provider. Make sure you discuss any questions you have with your health care provider. ° °Dietary Guidelines to Help Prevent Kidney Stones °Your risk of kidney stones can be decreased by adjusting the foods you eat. The most important thing you can do is drink enough fluid. You should drink enough fluid to keep your urine clear or  pale yellow. The following guidelines provide specific information for the type of kidney stone you have had. °GUIDELINES ACCORDING TO TYPE OF KIDNEY STONE °Calcium Oxalate Kidney Stones °· Reduce the amount of salt you eat. Foods that have a lot of salt cause your body to release excess calcium into your urine. The excess calcium can combine with a substance called oxalate to form kidney stones. °· Reduce the amount of animal protein you eat if the amount you eat is excessive. Animal protein causes your body to release excess calcium into your urine. Ask your dietitian how much protein from animal sources you should be eating. °· Avoid foods that are high in oxalates. If you take vitamins, they should have less than 500 mg of vitamin C. Your body turns vitamin C into oxalates. You do not need to avoid fruits and vegetables high in vitamin C. °Calcium Phosphate Kidney Stones °· Reduce the amount of salt you eat to help prevent the release of excess calcium into your urine. °· Reduce the amount of animal protein you eat if the amount you eat is excessive. Animal protein causes your body to release excess calcium into your urine. Ask your dietitian how much protein from animal sources you should be eating. °· Get enough calcium from food or take a calcium supplement (ask your dietitian for recommendations). Food sources of calcium that do not increase your risk of kidney stones include: °¨ Broccoli. °¨ Dairy products,   such as cheese and yogurt.  Pudding. Uric Acid Kidney Stones  Do not have more than 6 oz of animal protein per day. FOOD SOURCES Animal Protein Sources  Meat (all types).  Poultry.  Eggs.  Fish, seafood. Foods High in [employer] seasonings.  Soy sauce.  Teriyaki sauce.  Cured and processed meats.  Salted crackers and snack foods.  Fast food.  Canned soups and most canned foods. Foods High in Oxalates  Grains:  Amaranth.  Barley.  Grits.  Wheat  germ.  Bran.  Buckwheat flour.  All bran cereals.  Pretzels.  Whole wheat bread.  Vegetables:  Beans (wax).  Beets and beet greens.  Collard greens.  Eggplant.  [place]scarole.  Leeks.  Okra.  Parsley.  Rutabagas.  Spinach.  Swiss chard.  Tomato paste.  Fried potatoes.  Sweet potatoes.  Fruits:  Red currants.  Figs.  Kiwi.  Rhubarb.  Meat and Other Protein Sources:  Beans (dried).  Soy burgers and other soybean products.  Miso.  Nuts (peanuts, almonds, pecans, cashews, hazelnuts).  Nut butters.  Sesame seeds and tahini (paste made of sesame seeds).  Poppy seeds.  Beverages:  Chocolate drink mixes.  Soy milk.  Instant iced tea.  Juices made from high-oxalate fruits or vegetables.  Other:  Carob.  Chocolate.  Fruitcake.  Marmalades. Document Released: 10/06/2010 Document Revised: 06/16/2013 Document Reviewed: 05/08/2013 [facility] Patient Information 2015 [place], [place]. This information is not intended to replace advice given to you by your health care provider. Make sure you discuss any questions you have with your health care provider.  Post Anesthesia Home Care Instructions  Activity: Get plenty of rest for the remainder of the day. A responsible adult should stay with you for 24 hours following the procedure.  For the next 24 hours, DO NOT: -Drive a car -Paediatric nurse -Drink alcoholic beverages -Take any medication unless instructed by your physician -Make any legal decisions or sign important papers.  Meals: Start with liquid foods such as gelatin or soup. Progress to regular foods as tolerated. Avoid greasy, spicy, heavy foods. If nausea and/or vomiting occur, drink only clear liquids until the nausea and/or vomiting subsides. Call your physician if vomiting continues.  Special Instructions/Symptoms: Your throat may feel dry or sore from the anesthesia or the breathing tube placed in your throat during surgery.  If this causes discomfort, gargle with warm salt water. The discomfort should disappear within 24 hours. [facility] [phone] Post Ureteroscopy With or Without Stent Instructions  Definitions:  Ureter: The duct that transports urine from the kidney to the bladder. Stent:   A plastic hollow tube that is placed into the ureter, from the kidney to the                 bladder to prevent the ureter from swelling shut.  GENERAL INSTRUCTIONS:  Despite the fact that no skin incisions were used, the area around the ureter and bladder is raw and irritated. The stent is a foreign body which will further irritate the bladder wall. This irritation is manifested by increased frequency of urination, both day and night, and by an increase in the urge to urinate. In some, the urge to urinate is present almost always. Sometimes the urge is strong enough that you may not be able to stop yourself from urinating. The only real cure is to remove the stent and then give time for the bladder wall to heal which can't be done until the danger of the ureter  swelling shut has passed, which varies.  You may see some blood in your urine while the stent is in place and a few days afterwards. Do not be alarmed, even if the urine was clear for a while. Get off your feet and drink lots of fluids until clearing occurs. If you start to pass clots or don't improve, call us.  DIET: You may return to your normal diet immediately. Because of the raw surface of your bladder, alcohol, spicy foods, acid type foods and drinks with caffeine may cause irritation or frequency and should be used in moderation. To keep your urine flowing freely and to avoid constipation, drink plenty of fluids during the day ( 8-10 glasses ). Tip: Avoid cranberry juice because it is very acidic.  ACTIVITY: Your physical activity doesn't need to be restricted. However, if you are very active, you may see some blood in your urine. We suggest  that you reduce your activity under these circumstances until the bleeding has stopped.  BOWELS: It is important to keep your bowels regular during the postoperative period. Straining with bowel movements can cause bleeding. A bowel movement every other day is reasonable. Use a mild laxative if needed, such as Milk of Magnesia 2-3 tablespoons, or 2 Dulcolax tablets. Call if you continue to have problems. If you have been taking narcotics for pain, before, during or after your surgery, you may be constipated. Take a laxative if necessary.   MEDICATION: You should resume your pre-surgery medications unless told not to. In addition you will often be given an antibiotic to prevent infection. These should be taken as prescribed until the bottles are finished unless you are having an unusual reaction to one of the drugs.  PROBLEMS YOU SHOULD REPORT TO [place]:  Fevers over 100.5 Fahrenheit.  Heavy bleeding, or clots ( See above notes about blood in urine ).  Inability to urinate.  Drug reactions ( hives, rash, nausea, vomiting, diarrhea ).  Severe burning or pain with urination that is not improving.  FOLLOW-UP: You will need a follow-up appointment to monitor your progress. Call for this appointment at the number listed above. Usually the first appointment will be about three to fourteen days after your surgery.

## 2014-04-19 NOTE — Op Note (Signed)
Pre-operative diagnosis :  6 mm left ureteropelvic junction stone with pyelosinus backflow  Postoperative diagnosis: 6 mm left midureteral calculus  Operation:  Cystourethroscopy, left retrograde pyelogram interpretation, implantation of Backstop Gel; laser fractionation of ureteral stone, basket extraction of stone fragments, left double-J stent, 6 French by 26 cm (Contour stent).  Surgeon:  Chauncey Cruel. Gaynelle Arabian, MD  First assistant: None  Anesthesia:  General LMA  Preparation:  After appropriate pre-anesthesia, the patient was brought the operating room, placed on the operating table in the dorsal supine position where general LMA anesthesia was introduced. He was replaced in the dorsal lithotomy position, and the pubis was was prepped with Betadine solution and draped in usual fashion. The arm band was double checked. The history was double checked. The CT scan was double checked.  Review history:  Problems  1. Left ureteral stone (N20.1)   Assessed By: Carolan Clines (Urology); Last Assessed: 06 Apr 2014  History of Present Illness  53 YO male returns today for a 1 week f/u & KUB for hx of a 58mm Lt mid ureteral stone. He has previously been seen in the ER on 03/30/14 for LLQ abdominal pain & Lt flank pain that started yesterday. CT showed a 38mm Lt UPJ stone with mild/moderate hydronephrosis & extensive perinephric stranding. He also has a punctate Lt lower pole stone.      Statement of  Likelihood of Success: Excellent. TIME-OUT observed.:  Procedure:  Cystourethroscopy is copies, which showed normal circumcised penis, with normal glans. The meatus was normal. The pendulous urethra was normal. The proximal urethra was normal. The prostate showed bilobar hypertrophy, but normal bladder neck. The bladder base was normal, and the trigone was normal. The left ureteral orifice was identified, and left retrograde pyelogram was performed, which showed a dilated ureter, but no definite stone. Even  on double magnification, no definite stone could be identified. A 0.038 guidewire was placed through the ureteral orifice, and coiled in the renal pelvis. The 6 French short ureteroscope was then passed through the left ureteral orifice, and into the lower ureter. At the level of the mid ureter, just above the iliac vessels, a 6 mm multifaceted stone was identified. The stone was attempted to be basket extracted with a 4 wire flat basket, but could not be advanced past the iliac vessel indentation. Therefore, the stone was allowed to escape outside the basket. Backstop gel was then placed above the stone. Using a 365  laser fiber, with settings of 0.5/5, and increasing to 8/5, the stone was fractionated. The stone fragments were then basket extracted with a 4 wire flat basket. The ureteral orifice was noted to be very tight and small.           Even the stone fragments and difficulty being extracted from the orifice. Because of the multiple ureteroscopy is, and use of laser, I elected to place a double-J stent. A 6 French by 26 and a metered double-J stent was therefore passed easily over the guidewire, oil in the renal pelvis, and in the bladder. The patient received IV Tylenol, and IV Toradol during the procedure. He was awakened, and taken to recovery room in good condition.

## 2014-04-19 NOTE — Interval H&P Note (Signed)
History and Physical Interval Note:  04/19/2014 10:09 AM  Steven [name]  has presented today for surgery, with the diagnosis of Left Lower Ureteral Stone  The various methods of treatment have been discussed with the patient and family. After consideration of risks, benefits and other options for treatment, the patient has consented to  Procedure(s) with comments: CYSTOSCOPY WITH RETROGRADE PYELOGRAM, URETEROSCOPY AND STENT PLACEMENT (Left) - **POSSIBLE JJ STENT**     HOLMIUM LASER APPLICATION (Left) - **POSSIBLE** as a surgical intervention .  The patient's history has been reviewed, patient examined, no change in status, stable for surgery.  I have reviewed the patient's chart and labs.  Questions were answered to the patient's satisfaction.     Carolan Clines I

## 2014-04-19 NOTE — Transfer of Care (Signed)
Immediate Anesthesia Transfer of Care Note  Patient: Steven [name]  Procedure(s) Performed: Procedure(s): CYSTOSCOPY WITH RETROGRADE PYELOGRAM with interpretation, URETEROSCOPY, stone extraction with basket, AND STENT PLACEMENT, implantation of backstop (Left) HOLMIUM LASER APPLICATION (Left)  Patient Location: PACU  Anesthesia Type:General  Level of Consciousness: awake, alert , oriented and patient cooperative  Airway & Oxygen Therapy: Patient Spontanous Breathing and Patient connected to nasal cannula oxygen  Post-op Assessment: Report given to PACU RN and Post -op Vital signs reviewed and stable  Post vital signs: Reviewed and stable  Complications: No apparent anesthesia complications

## 2014-04-20 ENCOUNTER — Encounter (HOSPITAL_BASED_OUTPATIENT_CLINIC_OR_DEPARTMENT_OTHER): Payer: Self-pay | Admitting: Urology

## 2014-05-21 ENCOUNTER — Other Ambulatory Visit: Payer: Self-pay | Admitting: Family Medicine

## 2014-05-24 NOTE — Telephone Encounter (Signed)
Electronic refill request. Last Filled:    10 mL 0 RF 01/25/2014  Looks like there might have been a refill through the ER in October.  Please advise.

## 2014-05-24 NOTE — Telephone Encounter (Signed)
Printed.  This doesn't look like it was filled at the ER.   He is due for f/u lab visit (Recheck a peak T level and CBC).  Orders are in.  Thanks.

## 2014-05-25 ENCOUNTER — Other Ambulatory Visit: Payer: Self-pay | Admitting: *Deleted

## 2014-05-25 NOTE — Telephone Encounter (Signed)
Left detailed message on voicemail of home and cell #.

## 2014-05-28 ENCOUNTER — Other Ambulatory Visit (INDEPENDENT_AMBULATORY_CARE_PROVIDER_SITE_OTHER): Payer: BC Managed Care – PPO

## 2014-05-28 DIAGNOSIS — E291 Testicular hypofunction: Secondary | ICD-10-CM

## 2014-05-30 LAB — CBC WITH DIFFERENTIAL/PLATELET
Basophils Absolute: 0 10*3/uL (ref 0.0–0.1)
Basophils Relative: 0.5 % (ref 0.0–3.0)
EOS PCT: 3.3 % (ref 0.0–5.0)
Eosinophils Absolute: 0.2 10*3/uL (ref 0.0–0.7)
HEMATOCRIT: 47.7 % (ref 39.0–52.0)
Hemoglobin: 15.6 g/dL (ref 13.0–17.0)
Lymphocytes Relative: 47.9 % — ABNORMAL HIGH (ref 12.0–46.0)
Lymphs Abs: 3 10*3/uL (ref 0.7–4.0)
MCHC: 32.6 g/dL (ref 30.0–36.0)
MCV: 95 fl (ref 78.0–100.0)
MONOS PCT: 8.3 % (ref 3.0–12.0)
Monocytes Absolute: 0.5 10*3/uL (ref 0.1–1.0)
NEUTROS PCT: 40 % — AB (ref 43.0–77.0)
Neutro Abs: 2.5 10*3/uL (ref 1.4–7.7)
PLATELETS: 217 10*3/uL (ref 150.0–400.0)
RBC: 5.02 Mil/uL (ref 4.22–5.81)
RDW: 14.8 % (ref 11.5–15.5)
WBC: 6.3 10*3/uL (ref 4.0–10.5)

## 2014-05-30 LAB — TESTOSTERONE: Testosterone: 705.36 ng/dL (ref 300.00–890.00)

## 2014-11-17 ENCOUNTER — Other Ambulatory Visit: Payer: Self-pay | Admitting: Family Medicine

## 2014-11-17 DIAGNOSIS — E785 Hyperlipidemia, unspecified: Secondary | ICD-10-CM

## 2014-11-17 DIAGNOSIS — E291 Testicular hypofunction: Secondary | ICD-10-CM

## 2014-11-18 ENCOUNTER — Other Ambulatory Visit (INDEPENDENT_AMBULATORY_CARE_PROVIDER_SITE_OTHER): Payer: BLUE CROSS/BLUE SHIELD

## 2014-11-18 DIAGNOSIS — E291 Testicular hypofunction: Secondary | ICD-10-CM | POA: Diagnosis not present

## 2014-11-18 DIAGNOSIS — E785 Hyperlipidemia, unspecified: Secondary | ICD-10-CM | POA: Diagnosis not present

## 2014-11-18 LAB — COMPREHENSIVE METABOLIC PANEL
ALBUMIN: 4.4 g/dL (ref 3.5–5.2)
ALT: 29 U/L (ref 0–53)
AST: 21 U/L (ref 0–37)
Alkaline Phosphatase: 92 U/L (ref 39–117)
BUN: 12 mg/dL (ref 6–23)
CO2: 28 meq/L (ref 19–32)
CREATININE: 0.91 mg/dL (ref 0.40–1.50)
Calcium: 9.5 mg/dL (ref 8.4–10.5)
Chloride: 103 mEq/L (ref 96–112)
GFR: 91.82 mL/min (ref >60.00)
Glucose, Bld: 100 mg/dL — ABNORMAL HIGH (ref 70–99)
Potassium: 4.2 mEq/L (ref 3.5–5.1)
Sodium: 137 mEq/L (ref 135–145)
Total Bilirubin: 0.9 mg/dL (ref 0.2–1.2)
Total Protein: 6.8 g/dL (ref 6.0–8.3)

## 2014-11-18 LAB — CBC WITH DIFFERENTIAL/PLATELET
Basophils Absolute: 0.1 10*3/uL (ref 0.0–0.1)
Basophils Relative: 0.9 % (ref 0.0–3.0)
EOS ABS: 0.2 10*3/uL (ref 0.0–0.7)
Eosinophils Relative: 3.1 % (ref 0.0–5.0)
HCT: 48.9 % (ref 39.0–52.0)
HEMOGLOBIN: 16.5 g/dL (ref 13.0–17.0)
LYMPHS PCT: 43.8 % (ref 12.0–46.0)
Lymphs Abs: 3.5 10*3/uL (ref 0.7–4.0)
MCHC: 33.7 g/dL (ref 30.0–36.0)
MCV: 91.1 fl (ref 78.0–100.0)
Monocytes Absolute: 0.6 10*3/uL (ref 0.1–1.0)
Monocytes Relative: 7.2 % (ref 3.0–12.0)
Neutro Abs: 3.6 10*3/uL (ref 1.4–7.7)
Neutrophils Relative %: 45 % (ref 43.0–77.0)
Platelets: 214 10*3/uL (ref 150.0–400.0)
RBC: 5.36 Mil/uL (ref 4.22–5.81)
RDW: 13.2 % (ref 11.5–15.5)
WBC: 7.9 10*3/uL (ref 4.0–10.5)

## 2014-11-18 LAB — LIPID PANEL
Cholesterol: 232 mg/dL — ABNORMAL HIGH (ref 0–200)
HDL: 44.1 mg/dL (ref >39.00)
LDL Cholesterol: 149 mg/dL — ABNORMAL HIGH (ref 0–99)
NonHDL: 187.9
Total CHOL/HDL Ratio: 5
Triglycerides: 195 mg/dL — ABNORMAL HIGH (ref 0.0–149.0)
VLDL: 39 mg/dL (ref 0.0–40.0)

## 2014-11-18 LAB — TESTOSTERONE: TESTOSTERONE: 1346.38 ng/dL — AB (ref 300.00–890.00)

## 2014-11-18 LAB — PSA: PSA: 0.31 ng/mL (ref 0.10–4.00)

## 2014-11-23 ENCOUNTER — Encounter: Payer: Self-pay | Admitting: Family Medicine

## 2014-11-23 ENCOUNTER — Ambulatory Visit (INDEPENDENT_AMBULATORY_CARE_PROVIDER_SITE_OTHER): Payer: BLUE CROSS/BLUE SHIELD | Admitting: Family Medicine

## 2014-11-23 VITALS — BP 126/70 | HR 90 | Temp 97.7°F | Wt 242.5 lb

## 2014-11-23 DIAGNOSIS — M779 Enthesopathy, unspecified: Principal | ICD-10-CM

## 2014-11-23 DIAGNOSIS — E291 Testicular hypofunction: Secondary | ICD-10-CM | POA: Diagnosis not present

## 2014-11-23 DIAGNOSIS — M778 Other enthesopathies, not elsewhere classified: Secondary | ICD-10-CM | POA: Diagnosis not present

## 2014-11-23 DIAGNOSIS — E785 Hyperlipidemia, unspecified: Secondary | ICD-10-CM

## 2014-11-23 MED ORDER — TESTOSTERONE CYPIONATE 200 MG/ML IM SOLN: 320.0000 mg | INTRAMUSCULAR | Status: DC

## 2014-11-23 MED ORDER — SILDENAFIL CITRATE 20 MG PO TABS: 20.0000 mg | ORAL_TABLET | Freq: Every day | ORAL | Status: DC | PRN

## 2014-11-23 MED ORDER — ATORVASTATIN CALCIUM 10 MG PO TABS: 10.0000 mg | ORAL_TABLET | Freq: Every morning | ORAL | Status: DC

## 2014-11-23 NOTE — Progress Notes (Signed)
Pre visit review using our clinic review tool, if applicable. No additional management support is needed unless otherwise documented below in the visit note.  Snoring resolved, working on weight with diet and exercise.  D/w pt.    Low T.  Peak level drawn.  Prev with low values noted.  Feeling well.  No ADE on med.  Labs d/w pt.  PSA wnl.  CBC okay, d/w pt.  D/w pt about risk/benefit of med use.  He clearly feels better on medicine.     R triceps tendonitis.  Had been exercising, doing arm work.  No other trauma. No bruising, no pop or snap noted.   Elevated Cholesterol: Using medications without problems:off med for months Muscle aches: not at 10mg  dosing Diet compliance:yes Exercise:yes Lipids up, d/w pt.   PMH and SH reviewed  ROS: See HPI, otherwise noncontributory.  Meds, vitals, and allergies reviewed.   GEN: nad, alert and oriented HEENT: mucous membranes moist NECK: supple w/o LA CV: rrr. PULM: ctab, no inc wob ABD: soft, +bs EXT: no edema SKIN: no acute rash R arm with normal ROM but distal triceps tenderness noted on testing, on the lateral head of triceps.  Neg testing for tennis elbow.  Olecranon not ttp

## 2014-11-23 NOTE — Patient Instructions (Signed)
Restart lipitor.  Ice, use a wrap/sleeve, and limit tricep work.   Gradually return to exercise.   Glad to see you.

## 2014-11-25 ENCOUNTER — Encounter: Payer: Self-pay | Admitting: Family Medicine

## 2014-11-25 DIAGNOSIS — M779 Enthesopathy, unspecified: Principal | ICD-10-CM

## 2014-11-25 DIAGNOSIS — M778 Other enthesopathies, not elsewhere classified: Secondary | ICD-10-CM | POA: Insufficient documentation

## 2014-11-25 NOTE — Assessment & Plan Note (Signed)
No sign of rupture, rest and ice, should resolve.

## 2014-11-25 NOTE — Assessment & Plan Note (Signed)
Restart statin, labs d/w pt.  Continue diet and exercise o/w.

## 2014-11-25 NOTE — Assessment & Plan Note (Signed)
Peak level drawn.  Prev with low values noted.  Feeling well.  No ADE on med.  Labs d/w pt.  PSA wnl.  CBC okay, d/w pt.  D/w pt about risk/benefit of med use.  He clearly feels better on medicine.   I would expect the peak level to come down quick enough to be in range, wouldn't change dose at this point.  He agrees.

## 2014-11-30 ENCOUNTER — Encounter: Payer: Self-pay | Admitting: Gastroenterology

## 2014-12-16 ENCOUNTER — Telehealth: Payer: Self-pay | Admitting: *Deleted

## 2014-12-16 NOTE — Telephone Encounter (Signed)
PA sent through [facility] for Testosterone.  Approval letter received.  Placed on Dr. Josefine Class desk.

## 2014-12-17 NOTE — Telephone Encounter (Signed)
Patient advised.

## 2014-12-17 NOTE — Telephone Encounter (Signed)
Thanks.  Notify pt/pharmacy.

## 2015-03-14 ENCOUNTER — Other Ambulatory Visit: Payer: Self-pay | Admitting: Family Medicine

## 2015-03-14 DIAGNOSIS — E785 Hyperlipidemia, unspecified: Secondary | ICD-10-CM

## 2015-03-14 DIAGNOSIS — E291 Testicular hypofunction: Secondary | ICD-10-CM

## 2015-03-18 ENCOUNTER — Other Ambulatory Visit (INDEPENDENT_AMBULATORY_CARE_PROVIDER_SITE_OTHER): Payer: BLUE CROSS/BLUE SHIELD

## 2015-03-18 ENCOUNTER — Other Ambulatory Visit: Payer: Self-pay

## 2015-03-18 DIAGNOSIS — E785 Hyperlipidemia, unspecified: Secondary | ICD-10-CM | POA: Diagnosis not present

## 2015-03-18 DIAGNOSIS — E291 Testicular hypofunction: Secondary | ICD-10-CM

## 2015-03-18 LAB — LIPID PANEL
CHOLESTEROL: 139 mg/dL (ref 0–200)
HDL: 34.6 mg/dL — AB (ref >39.00)
LDL CALC: 80 mg/dL (ref 0–99)
NonHDL: 104.84
TRIGLYCERIDES: 125 mg/dL (ref 0.0–149.0)
Total CHOL/HDL Ratio: 4
VLDL: 25 mg/dL (ref 0.0–40.0)

## 2015-03-18 LAB — COMPREHENSIVE METABOLIC PANEL
ALBUMIN: 4.3 g/dL (ref 3.5–5.2)
ALT: 35 U/L (ref 0–53)
AST: 23 U/L (ref 0–37)
Alkaline Phosphatase: 78 U/L (ref 39–117)
BUN: 13 mg/dL (ref 6–23)
CALCIUM: 9.4 mg/dL (ref 8.4–10.5)
CHLORIDE: 104 meq/L (ref 96–112)
CO2: 28 mEq/L (ref 19–32)
Creatinine, Ser: 0.97 mg/dL (ref 0.40–1.50)
GFR: 85.19 mL/min (ref >60.00)
Glucose, Bld: 108 mg/dL — ABNORMAL HIGH (ref 70–99)
POTASSIUM: 4 meq/L (ref 3.5–5.1)
Sodium: 139 mEq/L (ref 135–145)
Total Bilirubin: 0.6 mg/dL (ref 0.2–1.2)
Total Protein: 6.9 g/dL (ref 6.0–8.3)

## 2015-03-18 LAB — TESTOSTERONE: Testosterone: 576.08 ng/dL (ref 300.00–890.00)

## 2015-03-21 ENCOUNTER — Encounter: Payer: Self-pay | Admitting: Family Medicine

## 2015-03-22 ENCOUNTER — Encounter: Payer: Self-pay | Admitting: Family Medicine

## 2015-03-22 ENCOUNTER — Ambulatory Visit (INDEPENDENT_AMBULATORY_CARE_PROVIDER_SITE_OTHER): Payer: BLUE CROSS/BLUE SHIELD | Admitting: Family Medicine

## 2015-03-22 VITALS — BP 132/62 | HR 90 | Temp 98.0°F | Wt 244.0 lb

## 2015-03-22 DIAGNOSIS — Z23 Encounter for immunization: Secondary | ICD-10-CM | POA: Diagnosis not present

## 2015-03-22 DIAGNOSIS — E291 Testicular hypofunction: Secondary | ICD-10-CM

## 2015-03-22 DIAGNOSIS — Z0000 Encounter for general adult medical examination without abnormal findings: Secondary | ICD-10-CM | POA: Diagnosis not present

## 2015-03-22 DIAGNOSIS — Z119 Encounter for screening for infectious and parasitic diseases, unspecified: Secondary | ICD-10-CM

## 2015-03-22 NOTE — Patient Instructions (Signed)
If you continue to have hot flashes, then let me know.  Take care.  Recheck labs before a visit in about 6 months.   Glad to see you.

## 2015-03-22 NOTE — Progress Notes (Signed)
Pre visit review using our clinic review tool, if applicable. No additional management support is needed unless otherwise documented below in the visit note.  CPE- See plan.  Routine anticipatory guidance given to patient.  See health maintenance. Tetanus 2015 Flu shot 2016 PNA not due.  Shingles d/w pt. Due at 60.  PSA wnl 2016. D/w pt.  Colonoscopy 2015 Living will d/w pt. Prev done. If incapacitated, would have his wife speak for him.  Diet and exercise d/w pt. Encouraged both, had been out of the gym while his arm pain improved.   Pt opts in for HCV and HIV screening with next set of labs.  D/w pt re: routine screening.    H/o low T.  On replacement.  T level wnl.  Labs d/w pt.  No ADE on meds.  He does have improvement with concentration, energy, ED.  He has occ hot flashes but not right after injection.  Unclear duration.  No weight loss, night sweats, no other "B" sx.    PMH and SH reviewed  Meds, vitals, and allergies reviewed.   ROS: See HPI.  Otherwise negative.    GEN: nad, alert and oriented HEENT: mucous membranes moist NECK: supple w/o LA CV: rrr. PULM: ctab, no inc wob ABD: soft, +bs EXT: no edema SKIN: no acute rash

## 2015-03-23 NOTE — Assessment & Plan Note (Signed)
Routine anticipatory guidance given to patient.  See health maintenance. Tetanus 2015 Flu shot 2016 PNA not due.  Shingles d/w pt. Due at 16.  PSA wnl 2016. D/w pt.  Colonoscopy 2015 Living will d/w pt. Prev done. If incapacitated, would have his wife speak for him.  Diet and exercise d/w pt. Encouraged both, had been out of the gym while his arm pain improved.   Pt opts in for HCV and HIV screening with next set of labs.  D/w pt re: routine screening.

## 2015-03-23 NOTE — Assessment & Plan Note (Signed)
H/o low T.  On replacement.  T level wnl.  Labs d/w pt.  No ADE on meds.  He does have improvement with concentration, energy, ED.  He has occ hot flashes but not right after injection.  Unclear duration.  No weight loss, night sweats, no other "B" sx.  He'll update me as needed. This is to serve as documentation only for my future reference with f/u, not active management of the problem today.

## 2015-05-27 ENCOUNTER — Encounter: Payer: Self-pay | Admitting: Family Medicine

## 2015-05-27 ENCOUNTER — Ambulatory Visit (INDEPENDENT_AMBULATORY_CARE_PROVIDER_SITE_OTHER): Payer: BLUE CROSS/BLUE SHIELD | Admitting: Family Medicine

## 2015-05-27 VITALS — BP 132/78 | HR 71 | Temp 98.0°F | Wt 247.2 lb

## 2015-05-27 DIAGNOSIS — M791 Myalgia, unspecified site: Secondary | ICD-10-CM | POA: Insufficient documentation

## 2015-05-27 DIAGNOSIS — E785 Hyperlipidemia, unspecified: Secondary | ICD-10-CM

## 2015-05-27 DIAGNOSIS — J0100 Acute maxillary sinusitis, unspecified: Secondary | ICD-10-CM | POA: Diagnosis not present

## 2015-05-27 MED ORDER — DOXYCYCLINE HYCLATE 100 MG PO TABS: 100.0000 mg | ORAL_TABLET | Freq: Two times a day (BID) | ORAL | Status: DC

## 2015-05-27 NOTE — Progress Notes (Signed)
Pre visit review using our clinic review tool, if applicable. No additional management support is needed unless otherwise documented below in the visit note.  R elbow is getting better.  Then he has L foot pain about 1 month ago had plantar fascia pain.  Pain with 1st step.  Then started having L shoulder pain.   Stopped all meds except for aspirin.  Foot and shoulders are resolved in the meantime.    Still with L neck pain, along the SCM.   Has noted a bad taste in his mouth, better with eating, going on for the last week.  Now  with L maxillary area pain.   He has f/u with the dental clinic next week.  No dental pain or sensitivity.    No fevers.  But generally doesn't feel well.  Worse in the later afternoon.    Meds, vitals, and allergies reviewed.   ROS: See HPI.  Otherwise, noncontributory.  GEN: nad, alert and oriented HEENT: mucous membranes moist, tm w/o erythema, nasal exam w/o erythema, clear discharge noted,  OP with minimal cobblestoning, no gingival inflamation or acute dental abnormality seen, L max sinus ttp NECK: supple w/o LA, no bruit.  L SCM ttp CV: rrr.   PULM: ctab, no inc wob EXT: no edema

## 2015-05-27 NOTE — Assessment & Plan Note (Signed)
Likely SCM strain on L side, should resolve.

## 2015-05-27 NOTE — Assessment & Plan Note (Signed)
Nontoxic, start doxy and f/u prn. He agrees.  Okay for outpatient f/u.

## 2015-05-27 NOTE — Assessment & Plan Note (Signed)
Likely myalgia from statin.  Stay off lipitor.  Consider pravastatin later on.  Improved off med.

## 2015-05-27 NOTE — Patient Instructions (Signed)
Stay off lipitor.  Consider pravastatin later on.  Update me in 06/2015.  Restart your regular meds one at a time.  Start doxy for presumed sinus infection.  Likely SCM strain.  Should resolve.   Take care.  Glad to see you.

## 2015-07-13 ENCOUNTER — Ambulatory Visit (INDEPENDENT_AMBULATORY_CARE_PROVIDER_SITE_OTHER): Payer: BLUE CROSS/BLUE SHIELD | Admitting: Family Medicine

## 2015-07-13 ENCOUNTER — Encounter: Payer: Self-pay | Admitting: Family Medicine

## 2015-07-13 VITALS — BP 120/82 | HR 100 | Temp 98.7°F | Wt 247.2 lb

## 2015-07-13 DIAGNOSIS — J0100 Acute maxillary sinusitis, unspecified: Secondary | ICD-10-CM | POA: Diagnosis not present

## 2015-07-13 DIAGNOSIS — K625 Hemorrhage of anus and rectum: Secondary | ICD-10-CM | POA: Diagnosis not present

## 2015-07-13 MED ORDER — DOXYCYCLINE HYCLATE 100 MG PO TABS: 100.0000 mg | ORAL_TABLET | Freq: Two times a day (BID) | ORAL | Status: DC

## 2015-07-13 MED ORDER — FLUTICASONE PROPIONATE 50 MCG/ACT NA SUSP: 2.0000 | Freq: Every day | NASAL | Status: DC

## 2015-07-13 NOTE — Patient Instructions (Addendum)
If you keep having blood in your stools or abdominal pain then let me know.   Rest and fluids.  Nasal saline, try flonase.  Start doxy in a few days if not better in the meantime.  Update me as needed.   Take care.  Glad to see you.

## 2015-07-13 NOTE — Progress Notes (Signed)
Pre visit review using our clinic review tool, if applicable. No additional management support is needed unless otherwise documented below in the visit note.  Rectal sx.  Some occ lower abd pain.  Some discomfort in the AM before eating, clearly better with eating.  Some blood with BMs for the last few stools intermittently.  BRBPR with wiping.  No pain with the BMs. Colonoscopy with polyps in 2015, with rec for 5 year f/u.   More sitting at work recently, ie more sedentary.  No straining with BMs.    Stuffy and facial pain, started recently ie yesterday.  No cough.  No fevers.  No ear pain.  Some rhinorrhea, greenish.  Minimal ST.  Sleep disrupted, from mouth breathing.    Meds, vitals, and allergies reviewed.   ROS: See HPI.  Otherwise, noncontributory.  GEN: nad, alert and oriented HEENT: mucous membranes moist, tm w/o erythema, nasal exam w/o erythema, clear discharge noted,  OP with cobblestoning, max sinuses ttp B, frontal not ttp NECK: supple w/o LA CV: rrr.   PULM: ctab, no inc wob abd soft, not ttp, normal BS

## 2015-07-14 DIAGNOSIS — K625 Hemorrhage of anus and rectum: Secondary | ICD-10-CM

## 2015-07-14 DIAGNOSIS — J019 Acute sinusitis, unspecified: Secondary | ICD-10-CM | POA: Insufficient documentation

## 2015-07-14 HISTORY — DX: Hemorrhage of anus and rectum: K62.5

## 2015-07-14 NOTE — Assessment & Plan Note (Addendum)
Some blood with BMs for the last few stools intermittently. BRBPR with wiping. No pain with the BMs. Colonoscopy with polyps in 2015, with rec for 5 year f/u.  Benign abd exam.  Observe for now, he'll notify me if sx continue.  Okay for outpatient f/u.  Not at all likely to be ominous source.  Sx are minimal enough that he wouldn't have schedule OV just for this- he mentioned only since he has OV re URI/sinusitus sx.

## 2015-07-14 NOTE — Assessment & Plan Note (Signed)
Short duration, nontoxic. This could be viral.  ddx d/w pt.  Rest and fluids. Nasal saline, try flonase.  Start doxy in a few days if not better in the meantime.  Update me as needed.  He agrees.

## 2015-10-19 ENCOUNTER — Ambulatory Visit (INDEPENDENT_AMBULATORY_CARE_PROVIDER_SITE_OTHER): Payer: BLUE CROSS/BLUE SHIELD | Admitting: Family Medicine

## 2015-10-19 ENCOUNTER — Encounter: Payer: Self-pay | Admitting: Family Medicine

## 2015-10-19 VITALS — BP 130/78 | HR 96 | Temp 98.5°F | Ht 74.0 in | Wt 251.5 lb

## 2015-10-19 DIAGNOSIS — M25562 Pain in left knee: Secondary | ICD-10-CM

## 2015-10-19 MED ORDER — TRAMADOL HCL 50 MG PO TABS: 50.0000 mg | ORAL_TABLET | Freq: Three times a day (TID) | ORAL | Status: DC | PRN

## 2015-10-19 MED ORDER — TESTOSTERONE CYPIONATE 200 MG/ML IM SOLN: 320.0000 mg | INTRAMUSCULAR | Status: DC

## 2015-10-19 NOTE — Progress Notes (Signed)
Pre visit review using our clinic review tool, if applicable. No additional management support is needed unless otherwise documented below in the visit note.  D/w pt about scheduling f/u CPE, we can check labs this fall with CPE.  He agrees.  Needed refill on testosterone, done and given to patient.   L knee sx for about 1 month.  No specific injury.  Pain with bending the knee.  Better if straight.  At 90deg, he'll have pain lateral to the patella, but not on the patella itself.  More pain with effort to flex the knee, a stinging pain.  No locking, no giving out, no weakness.  He can't take aleve.  He can tolerate ibuprofen but that hasn't helped much.    He has known L plantar fasciitis.  He has inserts.  He is stretching and icing, but still with pain.  That started about 1.5 months ago.  Unclear if gait changes from that have influenced the knee sx.   Meds, vitals, and allergies reviewed.   ROS: See HPI.  Otherwise, noncontributory.  nad ncat L knee with normal inspection, normal ROM, minimal crepitus.   ACL MCL LCL feel solid.  No meniscal click.  Patella not ttp Pain at 90 deg just lateral to the patella ITB testing wnl No weakness on testing quads/hamstring Patellar ligament not ttp No bruising no rash no swelling. L foot with origin of plantar fascia ttp

## 2015-10-19 NOTE — Patient Instructions (Addendum)
Schedule a physical for the fall, around September.   Labs ahead of time.  Stay off the ibuprofen.  Continue ice and stretching your foot.  Continue icing your knee.  Tramadol for pain in the meantime.  If not better soon, then we'll likely need to set you up with [facility].  Take care.  Glad to see you.

## 2015-10-20 DIAGNOSIS — M25569 Pain in unspecified knee: Secondary | ICD-10-CM | POA: Insufficient documentation

## 2015-10-20 NOTE — Assessment & Plan Note (Signed)
D/w pt.  Unclear if gait changes from plantar fasciitis have influenced the knee sx.  Would continue icing foot and knee, add on tramadol, continue foot stretches and insert use.   If not better, then we may need to have him see Dr. Lorelei Pont.   I question if he has irritated the lateral capsule of the knee.  The ITB isn't affected on testing and he has no hip abductor weakness. D/w pt.  He'll update me as needed.  Routine cautions given re: tramadol.

## 2015-12-05 ENCOUNTER — Other Ambulatory Visit: Payer: Self-pay | Admitting: Family Medicine

## 2015-12-05 NOTE — Telephone Encounter (Signed)
Electronic refill request.   Looks like it was last filled in January ??.  Please advise.

## 2015-12-05 NOTE — Telephone Encounter (Signed)
I sent the Rx to the pharmacy.

## 2015-12-14 DIAGNOSIS — R509 Fever, unspecified: Secondary | ICD-10-CM | POA: Diagnosis not present

## 2015-12-14 DIAGNOSIS — J0110 Acute frontal sinusitis, unspecified: Secondary | ICD-10-CM | POA: Diagnosis not present

## 2015-12-14 DIAGNOSIS — R51 Headache: Secondary | ICD-10-CM | POA: Diagnosis not present

## 2016-02-20 DIAGNOSIS — M9901 Segmental and somatic dysfunction of cervical region: Secondary | ICD-10-CM | POA: Diagnosis not present

## 2016-02-20 DIAGNOSIS — M545 Low back pain: Secondary | ICD-10-CM | POA: Diagnosis not present

## 2016-02-20 DIAGNOSIS — M9903 Segmental and somatic dysfunction of lumbar region: Secondary | ICD-10-CM | POA: Diagnosis not present

## 2016-02-21 DIAGNOSIS — M545 Low back pain: Secondary | ICD-10-CM | POA: Diagnosis not present

## 2016-02-21 DIAGNOSIS — M9901 Segmental and somatic dysfunction of cervical region: Secondary | ICD-10-CM | POA: Diagnosis not present

## 2016-02-21 DIAGNOSIS — M9903 Segmental and somatic dysfunction of lumbar region: Secondary | ICD-10-CM | POA: Diagnosis not present

## 2016-02-21 DIAGNOSIS — M5383 Other specified dorsopathies, cervicothoracic region: Secondary | ICD-10-CM | POA: Diagnosis not present

## 2016-02-23 DIAGNOSIS — M9901 Segmental and somatic dysfunction of cervical region: Secondary | ICD-10-CM | POA: Diagnosis not present

## 2016-02-23 DIAGNOSIS — M5383 Other specified dorsopathies, cervicothoracic region: Secondary | ICD-10-CM | POA: Diagnosis not present

## 2016-02-23 DIAGNOSIS — M9903 Segmental and somatic dysfunction of lumbar region: Secondary | ICD-10-CM | POA: Diagnosis not present

## 2016-02-23 DIAGNOSIS — M545 Low back pain: Secondary | ICD-10-CM | POA: Diagnosis not present

## 2016-03-01 DIAGNOSIS — M545 Low back pain: Secondary | ICD-10-CM | POA: Diagnosis not present

## 2016-03-01 DIAGNOSIS — M9901 Segmental and somatic dysfunction of cervical region: Secondary | ICD-10-CM | POA: Diagnosis not present

## 2016-03-01 DIAGNOSIS — M5383 Other specified dorsopathies, cervicothoracic region: Secondary | ICD-10-CM | POA: Diagnosis not present

## 2016-03-01 DIAGNOSIS — M9903 Segmental and somatic dysfunction of lumbar region: Secondary | ICD-10-CM | POA: Diagnosis not present

## 2016-03-12 ENCOUNTER — Other Ambulatory Visit: Payer: Self-pay | Admitting: *Deleted

## 2016-03-12 ENCOUNTER — Encounter: Payer: Self-pay | Admitting: Family Medicine

## 2016-03-12 ENCOUNTER — Telehealth: Payer: Self-pay

## 2016-03-12 ENCOUNTER — Ambulatory Visit (INDEPENDENT_AMBULATORY_CARE_PROVIDER_SITE_OTHER): Payer: BLUE CROSS/BLUE SHIELD | Admitting: Family Medicine

## 2016-03-12 DIAGNOSIS — L723 Sebaceous cyst: Secondary | ICD-10-CM | POA: Diagnosis not present

## 2016-03-12 DIAGNOSIS — L089 Local infection of the skin and subcutaneous tissue, unspecified: Secondary | ICD-10-CM

## 2016-03-12 MED ORDER — TRAMADOL HCL 50 MG PO TABS: 50.0000 mg | ORAL_TABLET | Freq: Three times a day (TID) | ORAL | 0 refills | Status: DC | PRN

## 2016-03-12 NOTE — Assessment & Plan Note (Signed)
Routine postprocedure instructions d/w pt- remove packing in 48h, keep area clean and bandaged, follow up if concerns/spreading erythema/pain. He agrees.  See AVS.

## 2016-03-12 NOTE — Telephone Encounter (Signed)
Medication phoned to pharmacy. Patient advised.  

## 2016-03-12 NOTE — Patient Instructions (Signed)
Pull the packing in two days.   Keep clean and covered.  Wash with soap and water.   Update me if fever or spreading redness.   Take care.  Glad to see you.

## 2016-03-12 NOTE — Progress Notes (Signed)
Pre visit review using our clinic review tool, if applicable. No additional management support is needed unless otherwise documented below in the visit note.  I&D  Meds, vitals, and allergies reviewed.   Indication: infected seb cyst.   Pt complaints of: erythema, pain, swelling  Location: upper midback.   Size: ~2cm  Informed consent obtained.  Pt aware of risks not limited to but including infection, bleeding, damage to near by organs.  Prep: etoh  Anesthesia: 1%lidocaine with epi, good effect  Incision made with #11 blade  Would explored and loculations removed, pus expressed.   Wound packed with iodoform gauze  Tolerated well

## 2016-03-12 NOTE — Telephone Encounter (Signed)
Pt left v/m requesting pain med called into [facility] [address] after removing cyst off pts back earlier today. Pt request cb.

## 2016-03-12 NOTE — Telephone Encounter (Signed)
If tylenol isn't helping, then try taking tramadol with sedation caution.  Please call in.  If sig pain still, then needs recheck.   I wouldn't expect him to have more than the typical soreness- small incision, no complications apparent at the time.  If a lot of pain, then does need recheck.   Thanks.

## 2016-03-14 DIAGNOSIS — M47812 Spondylosis without myelopathy or radiculopathy, cervical region: Secondary | ICD-10-CM | POA: Diagnosis not present

## 2016-03-18 ENCOUNTER — Other Ambulatory Visit: Payer: Self-pay | Admitting: Family Medicine

## 2016-03-18 DIAGNOSIS — Z119 Encounter for screening for infectious and parasitic diseases, unspecified: Secondary | ICD-10-CM

## 2016-03-18 DIAGNOSIS — E291 Testicular hypofunction: Secondary | ICD-10-CM

## 2016-03-18 DIAGNOSIS — Z125 Encounter for screening for malignant neoplasm of prostate: Secondary | ICD-10-CM

## 2016-03-18 DIAGNOSIS — E785 Hyperlipidemia, unspecified: Secondary | ICD-10-CM

## 2016-03-21 ENCOUNTER — Other Ambulatory Visit (INDEPENDENT_AMBULATORY_CARE_PROVIDER_SITE_OTHER): Payer: BLUE CROSS/BLUE SHIELD

## 2016-03-21 DIAGNOSIS — E291 Testicular hypofunction: Secondary | ICD-10-CM

## 2016-03-21 DIAGNOSIS — E785 Hyperlipidemia, unspecified: Secondary | ICD-10-CM

## 2016-03-21 DIAGNOSIS — Z125 Encounter for screening for malignant neoplasm of prostate: Secondary | ICD-10-CM | POA: Diagnosis not present

## 2016-03-21 DIAGNOSIS — Z119 Encounter for screening for infectious and parasitic diseases, unspecified: Secondary | ICD-10-CM

## 2016-03-21 LAB — TESTOSTERONE: TESTOSTERONE: 755.8 ng/dL (ref 300.00–890.00)

## 2016-03-21 LAB — COMPREHENSIVE METABOLIC PANEL WITH GFR
ALT: 33 U/L (ref 0–53)
AST: 24 U/L (ref 0–37)
Albumin: 4 g/dL (ref 3.5–5.2)
Alkaline Phosphatase: 79 U/L (ref 39–117)
BUN: 12 mg/dL (ref 6–23)
CO2: 28 meq/L (ref 19–32)
Calcium: 9.2 mg/dL (ref 8.4–10.5)
Chloride: 105 meq/L (ref 96–112)
Creatinine, Ser: 0.91 mg/dL (ref 0.40–1.50)
GFR: 91.37 mL/min (ref >60.00)
Glucose, Bld: 101 mg/dL — ABNORMAL HIGH (ref 70–99)
Potassium: 4.1 meq/L (ref 3.5–5.1)
Sodium: 140 meq/L (ref 135–145)
Total Bilirubin: 0.6 mg/dL (ref 0.2–1.2)
Total Protein: 6.7 g/dL (ref 6.0–8.3)

## 2016-03-21 LAB — CBC WITH DIFFERENTIAL/PLATELET
Basophils Absolute: 0.1 10*3/uL (ref 0.0–0.1)
Basophils Relative: 0.9 % (ref 0.0–3.0)
EOS ABS: 0.3 10*3/uL (ref 0.0–0.7)
Eosinophils Relative: 3.9 % (ref 0.0–5.0)
HEMATOCRIT: 48.5 % (ref 39.0–52.0)
HEMOGLOBIN: 16.7 g/dL (ref 13.0–17.0)
LYMPHS PCT: 34.1 % (ref 12.0–46.0)
Lymphs Abs: 2.6 10*3/uL (ref 0.7–4.0)
MCHC: 34.4 g/dL (ref 30.0–36.0)
MCV: 91.3 fl (ref 78.0–100.0)
MONO ABS: 0.6 10*3/uL (ref 0.1–1.0)
Monocytes Relative: 8 % (ref 3.0–12.0)
NEUTROS PCT: 53.1 % (ref 43.0–77.0)
Neutro Abs: 4 10*3/uL (ref 1.4–7.7)
Platelets: 231 10*3/uL (ref 150.0–400.0)
RBC: 5.31 Mil/uL (ref 4.22–5.81)
RDW: 14 % (ref 11.5–15.5)
WBC: 7.5 10*3/uL (ref 4.0–10.5)

## 2016-03-21 LAB — LIPID PANEL
Cholesterol: 218 mg/dL — ABNORMAL HIGH (ref 0–200)
HDL: 34.6 mg/dL — ABNORMAL LOW (ref >39.00)
LDL Cholesterol: 146 mg/dL — ABNORMAL HIGH (ref 0–99)
NonHDL: 183.76
Total CHOL/HDL Ratio: 6
Triglycerides: 191 mg/dL — ABNORMAL HIGH (ref 0.0–149.0)
VLDL: 38.2 mg/dL (ref 0.0–40.0)

## 2016-03-21 LAB — PSA: PSA: 0.7 ng/mL (ref 0.10–4.00)

## 2016-03-22 DIAGNOSIS — M5383 Other specified dorsopathies, cervicothoracic region: Secondary | ICD-10-CM | POA: Diagnosis not present

## 2016-03-22 DIAGNOSIS — M9901 Segmental and somatic dysfunction of cervical region: Secondary | ICD-10-CM | POA: Diagnosis not present

## 2016-03-22 DIAGNOSIS — M9903 Segmental and somatic dysfunction of lumbar region: Secondary | ICD-10-CM | POA: Diagnosis not present

## 2016-03-22 LAB — HIV ANTIBODY (ROUTINE TESTING W REFLEX): HIV 1&2 Ab, 4th Generation: NONREACTIVE

## 2016-03-22 LAB — HEPATITIS C ANTIBODY: HCV Ab: NEGATIVE

## 2016-03-23 ENCOUNTER — Encounter: Payer: BLUE CROSS/BLUE SHIELD | Admitting: Family Medicine

## 2016-03-26 ENCOUNTER — Encounter: Payer: Self-pay | Admitting: Family Medicine

## 2016-03-26 ENCOUNTER — Ambulatory Visit (INDEPENDENT_AMBULATORY_CARE_PROVIDER_SITE_OTHER): Payer: BLUE CROSS/BLUE SHIELD | Admitting: Family Medicine

## 2016-03-26 VITALS — BP 122/82 | HR 70 | Temp 98.0°F | Ht 74.0 in | Wt 246.5 lb

## 2016-03-26 DIAGNOSIS — Z0000 Encounter for general adult medical examination without abnormal findings: Secondary | ICD-10-CM | POA: Diagnosis not present

## 2016-03-26 DIAGNOSIS — E785 Hyperlipidemia, unspecified: Secondary | ICD-10-CM

## 2016-03-26 DIAGNOSIS — Z23 Encounter for immunization: Secondary | ICD-10-CM | POA: Diagnosis not present

## 2016-03-26 DIAGNOSIS — E291 Testicular hypofunction: Secondary | ICD-10-CM

## 2016-03-26 MED ORDER — TESTOSTERONE CYPIONATE 200 MG/ML IM SOLN: 320.0000 mg | INTRAMUSCULAR | 1 refills | Status: DC

## 2016-03-26 NOTE — Progress Notes (Signed)
Pre visit review using our clinic review tool, if applicable. No additional management support is needed unless otherwise documented below in the visit note. 

## 2016-03-26 NOTE — Patient Instructions (Signed)
Take care.  Glad to see you.  We can freeze the seborrheic keratosis on the right side of your face when needed.  These spots aren't cancerous.

## 2016-03-26 NOTE — Progress Notes (Signed)
CPE- See plan.  Routine anticipatory guidance given to patient.  See health maintenance. Tetanus 2015 Flu shot 2017 PNA not due.  Shingles d/w pt. Due at 64.  PSA wnl 2017. D/w pt.  Colonoscopy 2015 Living will d/w pt. Prev done. If incapacitated, would have his wife speak for him.  Diet and exercise d/w pt. Encouraged both.  HCV and HIV neg.  D/w pt.   Labs d/w pt.    Testosterone replacement.  No ADE on med.  Complaint.  Labs d/w pt.  Risk/benefit d/w pt.  Feels well on med.  Fatigue resolved on med. ED treated, manageable.    No consistent snoring; improved with elevating the head of the bed.  No apparent need for CPAP.    PMH and SH reviewed  Meds, vitals, and allergies reviewed.   ROS: Per HPI.  Unless specifically indicated otherwise in HPI, the patient denies:  General: fever. Eyes: acute vision changes ENT: sore throat Cardiovascular: chest pain Respiratory: SOB GI: vomiting GU: dysuria Musculoskeletal: acute back pain Derm: acute rash Neuro: acute motor dysfunction Psych: worsening mood Endocrine: polydipsia Heme: bleeding Allergy: hayfever  GEN: nad, alert and oriented HEENT: mucous membranes moist NECK: supple w/o LA CV: rrr. PULM: ctab, no inc wob ABD: soft, +bs EXT: no edema SKIN: no acute rash, seb cyst on back is healed.   He has a small irritated seborrheic keratosis on the right side of the face. Discussed with patient about options. He wanted return later on for me to freeze this. This is reasonable.

## 2016-03-27 NOTE — Assessment & Plan Note (Signed)
Tetanus 2015 Flu shot 2017 PNA not due.  Shingles d/w pt. Due at 83.  PSA wnl 2017. D/w pt.  Colonoscopy 2015 Living will d/w pt. Prev done. If incapacitated, would have his wife speak for him.  Diet and exercise d/w pt. Encouraged both.  HCV and HIV neg.  D/w pt.   Labs d/w pt.

## 2016-03-27 NOTE — Assessment & Plan Note (Signed)
Testosterone replacement.  No ADE on med.  Complaint.  Labs d/w pt.  Risk/benefit d/w pt.  Feels well on med.  Fatigue resolved on med. ED treated, manageable.

## 2016-03-27 NOTE — Assessment & Plan Note (Signed)
Intolerant of statin previously. Continue with work on diet and exercise. Discussed with patient about labs.

## 2016-10-18 ENCOUNTER — Other Ambulatory Visit: Payer: Self-pay | Admitting: Family Medicine

## 2016-10-18 NOTE — Telephone Encounter (Signed)
Electronic refill request. Last office visit:   03/26/2016  Last Filled:   10 mL 1 03/26/2016  Please advise.

## 2016-10-19 NOTE — Telephone Encounter (Signed)
Printed.  Thanks.  Please fax in or have patient pick up.

## 2016-10-19 NOTE — Telephone Encounter (Signed)
[facility] [place]  Patient advised.

## 2016-10-26 ENCOUNTER — Telehealth: Payer: Self-pay | Admitting: *Deleted

## 2016-10-26 ENCOUNTER — Other Ambulatory Visit: Payer: Self-pay | Admitting: *Deleted

## 2016-10-26 MED ORDER — TESTOSTERONE CYPIONATE 200 MG/ML IM SOLN: INTRAMUSCULAR | 1 refills | Status: DC

## 2016-10-26 NOTE — Telephone Encounter (Signed)
Spoke [facility] and is requesting a new Rx be sent to [facility]. pls advise

## 2016-10-26 NOTE — Telephone Encounter (Signed)
Medication phoned to [facility] as requested.

## 2016-10-26 NOTE — Telephone Encounter (Signed)
See below.  Please call in.  Thanks.

## 2017-02-06 ENCOUNTER — Other Ambulatory Visit: Payer: Self-pay | Admitting: Family Medicine

## 2017-02-06 NOTE — Telephone Encounter (Signed)
Received refill request electronically Last refill 10/26/16 - 10 ml/1 refill Last office visit 03/26/16

## 2017-02-07 NOTE — Telephone Encounter (Signed)
Please call in.  Thanks.   

## 2017-02-07 NOTE — Telephone Encounter (Signed)
Medication phoned to pharmacy.  

## 2017-03-06 ENCOUNTER — Encounter: Payer: Self-pay | Admitting: Family Medicine

## 2017-03-06 ENCOUNTER — Ambulatory Visit (INDEPENDENT_AMBULATORY_CARE_PROVIDER_SITE_OTHER): Payer: BLUE CROSS/BLUE SHIELD | Admitting: Family Medicine

## 2017-03-06 VITALS — BP 130/82 | HR 82 | Temp 97.8°F | Wt 249.0 lb

## 2017-03-06 DIAGNOSIS — E291 Testicular hypofunction: Secondary | ICD-10-CM | POA: Diagnosis not present

## 2017-03-06 DIAGNOSIS — R1013 Epigastric pain: Secondary | ICD-10-CM | POA: Diagnosis not present

## 2017-03-06 DIAGNOSIS — Z125 Encounter for screening for malignant neoplasm of prostate: Secondary | ICD-10-CM

## 2017-03-06 DIAGNOSIS — E785 Hyperlipidemia, unspecified: Secondary | ICD-10-CM

## 2017-03-06 NOTE — Patient Instructions (Signed)
Go to the lab on the way out.  We'll contact you with your lab report. Take care.  Glad to see you.  We may need to get you over to the GI clinic if the labs are unremarkable.

## 2017-03-06 NOTE — Progress Notes (Signed)
Abdominal symptoms.  In the last month or so he would note some nausea.  Episodic.  BM change- higher frequency but smaller stool volume per BM.  No fevers.  No blood in stool.  No black stools.  Minimal abd pain but more of a bloating/discomfort feeling.  Not worse with any clear trigger foods.  No diet changes.  No new meds added.    He had some rectal pain that is persistent.    Colonoscopy 2015 with tubular adenomas noted.    Meds, vitals, and allergies reviewed.   ROS: Per HPI unless specifically indicated in ROS section   GEN: nad, alert and oriented HEENT: mucous membranes moist NECK: supple w/o LA CV: rrr. PULM: ctab, no inc wob ABD: soft, +bs, epigastrum slightly ttp, no rebound.  Normal ext rectal exam.  No gross blood.  EXT: no edema SKIN: no acute rash

## 2017-03-07 DIAGNOSIS — R1013 Epigastric pain: Secondary | ICD-10-CM | POA: Insufficient documentation

## 2017-03-07 LAB — CBC WITH DIFFERENTIAL/PLATELET
Basophils Absolute: 0.2 10*3/uL — ABNORMAL HIGH (ref 0.0–0.1)
Basophils Relative: 1.7 % (ref 0.0–3.0)
EOS PCT: 3.5 % (ref 0.0–5.0)
Eosinophils Absolute: 0.3 10*3/uL (ref 0.0–0.7)
HCT: 52.3 % — ABNORMAL HIGH (ref 39.0–52.0)
Hemoglobin: 17.3 g/dL — ABNORMAL HIGH (ref 13.0–17.0)
LYMPHS ABS: 3.5 10*3/uL (ref 0.7–4.0)
Lymphocytes Relative: 36.7 % (ref 12.0–46.0)
MCHC: 33.2 g/dL (ref 30.0–36.0)
MCV: 94.6 fl (ref 78.0–100.0)
MONO ABS: 1.1 10*3/uL — AB (ref 0.1–1.0)
MONOS PCT: 11.6 % (ref 3.0–12.0)
NEUTROS ABS: 4.4 10*3/uL (ref 1.4–7.7)
NEUTROS PCT: 46.5 % (ref 43.0–77.0)
PLATELETS: 226 10*3/uL (ref 150.0–400.0)
RBC: 5.53 Mil/uL (ref 4.22–5.81)
RDW: 13.9 % (ref 11.5–15.5)
WBC: 9.5 10*3/uL (ref 4.0–10.5)

## 2017-03-07 LAB — LIPID PANEL
CHOL/HDL RATIO: 5
Cholesterol: 249 mg/dL — ABNORMAL HIGH (ref 0–200)
HDL: 46.4 mg/dL (ref >39.00)
NonHDL: 202.81
Triglycerides: 380 mg/dL — ABNORMAL HIGH (ref 0.0–149.0)
VLDL: 76 mg/dL — AB (ref 0.0–40.0)

## 2017-03-07 LAB — COMPREHENSIVE METABOLIC PANEL
ALK PHOS: 76 U/L (ref 39–117)
ALT: 34 U/L (ref 0–53)
AST: 27 U/L (ref 0–37)
Albumin: 4.7 g/dL (ref 3.5–5.2)
BUN: 12 mg/dL (ref 6–23)
CHLORIDE: 102 meq/L (ref 96–112)
CO2: 29 meq/L (ref 19–32)
Calcium: 9.9 mg/dL (ref 8.4–10.5)
Creatinine, Ser: 1.01 mg/dL (ref 0.40–1.50)
GFR: 80.74 mL/min (ref >60.00)
GLUCOSE: 65 mg/dL — AB (ref 70–99)
POTASSIUM: 4 meq/L (ref 3.5–5.1)
SODIUM: 140 meq/L (ref 135–145)
TOTAL PROTEIN: 7.1 g/dL (ref 6.0–8.3)
Total Bilirubin: 0.5 mg/dL (ref 0.2–1.2)

## 2017-03-07 LAB — LIPASE: LIPASE: 26 U/L (ref 11.0–59.0)

## 2017-03-07 LAB — LDL CHOLESTEROL, DIRECT: Direct LDL: 162 mg/dL

## 2017-03-07 LAB — PSA: PSA: 0.56 ng/mL (ref 0.10–4.00)

## 2017-03-07 LAB — TESTOSTERONE: Testosterone: 648.11 ng/dL (ref 300.00–890.00)

## 2017-03-07 NOTE — Assessment & Plan Note (Signed)
With some occasional nausea. No clear cause. He could have GERD symptoms but that would not explain the change in stools with smaller stools happening more often. Would check routine labs today and go from there. Benign exam overall. Okay for outpatient follow-up. Discussed with patient. He agrees.

## 2017-03-25 ENCOUNTER — Encounter: Payer: Self-pay | Admitting: Radiology

## 2017-03-25 ENCOUNTER — Telehealth: Payer: Self-pay | Admitting: Family Medicine

## 2017-03-25 NOTE — Telephone Encounter (Signed)
Patient Name: Steven [name]  DOB: [birth year]    Initial Comment Bill states he is having pain in his lower back and blood in his stool.   Nurse Assessment  Nurse: Verlin Fester RN, Stanton Kidney Date/Time (Eastern Time): 03/25/2017 8:38:23 AM  Confirm and document reason for call. If symptomatic, describe symptoms. ---Patient states he is having lower back pain and blood in his stool.  Does the patient have any new or worsening symptoms? ---Yes  Will a triage be completed? ---Yes  Related visit to physician within the last 2 weeks? ---No  Does the PT have any chronic conditions? (i.e. diabetes, asthma, etc.) ---No  Is this a behavioral health or substance abuse call? ---No     Guidelines    Guideline Title Affirmed Question Affirmed Notes  Rectal Bleeding MODERATE rectal bleeding (small blood clots, passing blood without stool, or toilet water turns red)    Final Disposition User   See Physician [facility] [place], RN, Stanton Kidney    Comments  After triage attempted to schedule appt within 24 hours and none found. Asked patient if he will see a different practitioner and he states he wants to make an appt with Dr. Damita Dunnings for Tuesday instead.   Referrals  REFERRED TO PCP OFFICE   Caller Disagree/Comply Disagree  Caller Understands Yes  PreDisposition Home Care

## 2017-03-25 NOTE — Telephone Encounter (Signed)
Has appointment tomorrow.  Will see then.  Thanks.

## 2017-03-25 NOTE — Telephone Encounter (Signed)
Pt has appt with Dr Damita Dunnings 03/26/17 at 3:30.

## 2017-03-26 ENCOUNTER — Encounter: Payer: Self-pay | Admitting: Family Medicine

## 2017-03-26 ENCOUNTER — Ambulatory Visit (INDEPENDENT_AMBULATORY_CARE_PROVIDER_SITE_OTHER): Payer: BLUE CROSS/BLUE SHIELD | Admitting: Family Medicine

## 2017-03-26 VITALS — BP 110/70 | HR 82 | Temp 97.5°F | Wt 249.2 lb

## 2017-03-26 DIAGNOSIS — R1031 Right lower quadrant pain: Secondary | ICD-10-CM

## 2017-03-26 DIAGNOSIS — R109 Unspecified abdominal pain: Secondary | ICD-10-CM

## 2017-03-26 NOTE — Progress Notes (Signed)
Still with nausea w/o vomiting; and bloating in the abd.  Last week with some blood in stool, started about 1 week ago but none today.  Red blood in stool, streaky blood with stools.  No fevers.  No black stools.  Colonoscopy 01/06/2014 w/o diverticular changes noted.   Prev labs unremarkable.   Meds, vitals, and allergies reviewed.   ROS: Per HPI unless specifically indicated in ROS section   GEN: nad, alert and oriented HEENT: mucous membranes moist NECK: supple w/o LA CV: rrr.  no murmur PULM: ctab, no inc wob ABD: soft, +bs, prev with some R lower back pain and RLQ twinges but neither is ttp on exam today at OV EXT: no edema SKIN: no acute rash No juandice.

## 2017-03-26 NOTE — Patient Instructions (Addendum)
Steven [name] will call about your referral. Hold the aspirin for now.   Take care.  Glad to see you.  We'll be in touch after the CT tomorrow.

## 2017-03-27 ENCOUNTER — Telehealth: Payer: Self-pay

## 2017-03-27 ENCOUNTER — Telehealth: Payer: Self-pay | Admitting: Gastroenterology

## 2017-03-27 ENCOUNTER — Other Ambulatory Visit: Payer: BLUE CROSS/BLUE SHIELD

## 2017-03-27 ENCOUNTER — Ambulatory Visit (INDEPENDENT_AMBULATORY_CARE_PROVIDER_SITE_OTHER)
Admission: RE | Admit: 2017-03-27 | Discharge: 2017-03-27 | Disposition: A | Discharge status: Home / Self Care | Payer: BLUE CROSS/BLUE SHIELD | Source: Ambulatory Visit | Attending: Family Medicine | Admitting: Family Medicine

## 2017-03-27 DIAGNOSIS — K921 Melena: Secondary | ICD-10-CM | POA: Diagnosis not present

## 2017-03-27 DIAGNOSIS — R109 Unspecified abdominal pain: Secondary | ICD-10-CM

## 2017-03-27 MED ORDER — IOPAMIDOL (ISOVUE-300) INJECTION 61%
100.0000 mL | Freq: Once | INTRAVENOUS | Status: AC | PRN
  Administered 2017-03-27: 100 mL via INTRAVENOUS

## 2017-03-27 NOTE — Telephone Encounter (Signed)
Left message on machine to call back  

## 2017-03-27 NOTE — Telephone Encounter (Signed)
Bloating, nausea and RLQ abd pain, BRB, changes in bowels.  CT normal.  Had labs on 03/06/17.  Last saw Dr Ardis Hughs for colon on 7/15.    ENDOSCOPIC IMPRESSION: Two polyps were found, removed and sent to pathology. The examination was otherwise normal.   Diagnosis Surgical [P], ascending, polyp (2) - TUBULAR ADENOMAS (X2). NO HIGH GRADE DYSPLASIA OR INVASIVE MALIGNANCY IDENTIFIED.  Pt scheduled to see Amy on 04/01/17 2 pm

## 2017-03-27 NOTE — Assessment & Plan Note (Signed)
With unremarkable labs previously, ongoing symptoms, now with blood in stool, benign abdominal exam at time of office visit. Discussed with patient about options. He does not look to be acutely ill. Would not require emergency room evaluation hospitalization based on exam. He agrees. Would need imaging given his symptoms. If negative refer to GI. He does not have history of diverticulosis so diverticulitis would be unexpected. Symptoms atypical for appendicitis. He does not have history of colitis. All discussed with patient. >25 minutes spent in face to face time with patient, >50% spent in counselling or coordination of care.

## 2017-03-27 NOTE — Telephone Encounter (Signed)
Pt had stat CT abdomen and pelvis; Dr Josefine Class contact # was on stat order and wanted pt held until called. Marzetta Board will call Dr Damita Dunnings and call [facility] back if needed.

## 2017-03-27 NOTE — Telephone Encounter (Signed)
I talked with patient about the call report.  No cause of sx seen.  I think he should see GI.  I put in the referral.  Thanks.

## 2017-04-01 ENCOUNTER — Other Ambulatory Visit (INDEPENDENT_AMBULATORY_CARE_PROVIDER_SITE_OTHER): Payer: BLUE CROSS/BLUE SHIELD

## 2017-04-01 ENCOUNTER — Ambulatory Visit (INDEPENDENT_AMBULATORY_CARE_PROVIDER_SITE_OTHER): Payer: BLUE CROSS/BLUE SHIELD | Admitting: Family Medicine

## 2017-04-01 ENCOUNTER — Encounter: Payer: Self-pay | Admitting: Physician Assistant

## 2017-04-01 ENCOUNTER — Ambulatory Visit (INDEPENDENT_AMBULATORY_CARE_PROVIDER_SITE_OTHER): Payer: BLUE CROSS/BLUE SHIELD | Admitting: Physician Assistant

## 2017-04-01 ENCOUNTER — Encounter: Payer: Self-pay | Admitting: Family Medicine

## 2017-04-01 VITALS — BP 122/76 | HR 91 | Temp 97.4°F | Ht 74.0 in | Wt 246.0 lb

## 2017-04-01 VITALS — BP 126/82 | HR 78 | Ht 74.0 in | Wt 247.2 lb

## 2017-04-01 DIAGNOSIS — Z860101 Personal history of adenomatous and serrated colon polyps: Secondary | ICD-10-CM

## 2017-04-01 DIAGNOSIS — K625 Hemorrhage of anus and rectum: Secondary | ICD-10-CM | POA: Diagnosis not present

## 2017-04-01 DIAGNOSIS — R112 Nausea with vomiting, unspecified: Secondary | ICD-10-CM | POA: Diagnosis not present

## 2017-04-01 DIAGNOSIS — R1013 Epigastric pain: Secondary | ICD-10-CM

## 2017-04-01 DIAGNOSIS — Z23 Encounter for immunization: Secondary | ICD-10-CM | POA: Diagnosis not present

## 2017-04-01 DIAGNOSIS — E291 Testicular hypofunction: Secondary | ICD-10-CM

## 2017-04-01 DIAGNOSIS — Z122 Encounter for screening for malignant neoplasm of respiratory organs: Secondary | ICD-10-CM

## 2017-04-01 DIAGNOSIS — Z7189 Other specified counseling: Secondary | ICD-10-CM

## 2017-04-01 DIAGNOSIS — Z86010 Personal history of colonic polyps: Secondary | ICD-10-CM | POA: Diagnosis not present

## 2017-04-01 DIAGNOSIS — Z0000 Encounter for general adult medical examination without abnormal findings: Secondary | ICD-10-CM

## 2017-04-01 DIAGNOSIS — R109 Unspecified abdominal pain: Secondary | ICD-10-CM

## 2017-04-01 DIAGNOSIS — R0683 Snoring: Secondary | ICD-10-CM

## 2017-04-01 HISTORY — DX: Personal history of adenomatous and serrated colon polyps: Z86.0101

## 2017-04-01 HISTORY — DX: Personal history of colonic polyps: Z86.010

## 2017-04-01 LAB — H. PYLORI ANTIBODY, IGG: H PYLORI IGG: NEGATIVE

## 2017-04-01 MED ORDER — PANTOPRAZOLE SODIUM 40 MG PO TBEC: 40.0000 mg | DELAYED_RELEASE_TABLET | Freq: Every day | ORAL | 3 refills | Status: DC

## 2017-04-01 NOTE — Patient Instructions (Addendum)
Please go to the basement level to have your labs drawn.  We have sent the following medications to your pharmacy for you to pick up at your convenience: [place].  1. Pantoprazole Sodium ( Protonix) 40 mg.   You have been scheduled for an endoscopy. Please follow written instructions given to you at your visit today. If you use inhalers (even only as needed), please bring them with you on the day of your procedure. Your physician has requested that you go to www.[email or web address] and enter the access code given to you at your visit today. This web site gives a general overview about your procedure. However, you should still follow specific instructions given to you by our office regarding your preparation for the procedure.  If rectal bleeding reoccures, you will need a colonoscopy.

## 2017-04-01 NOTE — Progress Notes (Signed)
Subjective:    Patient ID: Steven [name], male    DOB: [birth year], 56 y.o.   MRN: [id number]  HPI [name] is a pleasant 56 year old white male, known to Dr. Ardis Hughs from prior colonoscopy, who is referred today by Dr. Elsie Stain for evaluation of abdominal pain, bloating change in bowels and an episode of rectal bleeding. Patient was last seen in GI in July 2015 for colonoscopy which was done for history of adenomatous colon polyps. He had 2 small polyps removed from the descending colon each about 2 mm in size. These were tubular adenomas, otherwise negative exam and no internal hemorrhoids mentioned. Colonoscopy 2011 commented on mild left colon diverticulosis. Patient says his current symptoms have been present for a little over a month. He started with nausea which is been intermittent and seems better with some food in his stomach. He says the nausea comes back though even after eating at some point. Has not had vomiting. His appetite is been okay weight has been stable. He is also noticed some somewhat migratory lower abdominal discomfort. He describes this as occasional jabs of sharp pain which can be either in the right side or the left side of his abdomen. He had noticed that his stools appear to be somewhat thinner and more frequent and then about a week ago noticed bright red blood on the tissue off and on for a few days. His stool was brown and normal and there was no melena. He had no associated rectal pain. He says the bleeding has since subsided.   Labs done on 03/12/2017 showed WBC of 9.5, hemoglobin 17.5 and CMET unremarkable. CT of the abdomen and pelvis was done with contrast on 03/27/2017 showed hepatic steatosis and a 3 mm left renal stone otherwise negative exam. Currently taking a baby aspirin daily no regular NSAIDs. He said he had been using NSAIDs fairly regularly at least a couple per day but had stopped this a couple of months ago.  Review of Systems  Pertinent  positive and negative review of systems were noted in the above HPI section.  All other review of systems was otherwise negative.  Outpatient Encounter Prescriptions as of 04/01/2017  Medication Sig  . acetaminophen (TYLENOL) 500 MG tablet Take 500 mg by mouth every 6 (six) hours as needed.  Marland Kitchen aspirin 81 MG tablet Take 81 mg by mouth daily.    . fexofenadine (ALLEGRA) 180 MG tablet Take 180 mg by mouth as needed for allergies or rhinitis.  . sildenafil (REVATIO) 20 MG tablet TAKE 3-5 TABLETS BY MOUTH DAILY AS NEEDED  . testosterone cypionate (DEPOTESTOSTERONE CYPIONATE) 200 MG/ML injection INJECT 1.6ML INTO THE MUSCLE ONCE EVERY 14 DAYS.  Marland Kitchen pantoprazole (PROTONIX) 40 MG tablet Take 1 tablet (40 mg total) by mouth daily.   No facility-administered encounter medications on file as of 04/01/2017.    Allergies  Allergen Reactions  . Lipitor [Atorvastatin] Other (See Comments)    Aches  . Naproxen Sodium Swelling  . Penicillins Swelling  . Amoxicillin Rash  . Erythromycin Rash   Patient Active Problem List   Diagnosis Date Noted  . Hx of adenomatous colonic polyps 04/01/2017  . Abdominal pain 03/27/2017  . Epigastric pain 03/07/2017  . Knee pain 10/20/2015  . BRBPR (bright red blood per rectum) 07/14/2015  . Muscle pain 05/27/2015  . Triceps tendinitis 11/25/2014  . Advance care planning 03/11/2014  . Hyperglycemia 03/10/2013  . ED (erectile dysfunction) 03/10/2013  . Routine general medical examination at a  health care facility 02/29/2012  . SK (seborrheic keratosis) 02/29/2012  . Testicular pain 12/01/2010  . INTERMITTENT VERTIGO 05/08/2010  . VARICOCELE 02/17/2010  . Hypogonadism in male 02/13/2010  . HLD (hyperlipidemia) 02/13/2010  . PLANTAR FASCIITIS 02/13/2010  . TB SKIN TEST, POSITIVE 02/13/2010  . HEART MURMUR, HX OF 02/13/2010   Social History   Social History  . Marital status: Married    Spouse name: N/A  . Number of children: 3  . Years of education: N/A    Occupational History  . 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 Smoking status: Former Smoker    Packs/day: 1.50    Years: 30.00    Types: Cigarettes    Quit date: 10/29/2008  . Smokeless tobacco: Never Used  . Alcohol use 0.0 oz/week     Comment: Rare  . Drug use: No  . Sexual activity: Not on file   Other Topics Concern  . Not on file   Social History Narrative   Married since 1988, second marriage   3 children   1 daughter alive -POTTS - monitors salt intake.     2 step-daughters (one diagnosed with Crohn's Disease.)   Regular exercise:  Yes    Mr. Hosek's family history includes Alzheimer's disease in his father; Cancer in his mother; Diabetes in his brother and other; Kidney disease in his other; Parkinsonism in his father.      Objective:    Vitals:   04/01/17 1353  BP: 126/82  Pulse: 78    Physical Exam ;Well-developed older white male in no acute distress, pleasant blood pressure 126/82 pulse 78, height 6 foot 2, weight 247, BMI 31.7. HEENT; nontraumatic normocephalic EOMI PERRLA sclera anicteric, Cardiovascular; regular rate and rhythm with S1-S2 no murmur or gallop, Pulmonary ;clear bilaterally abdomen obese, soft, is tender in the epigastrium there is no guarding or rebound no palpable mass or hepatosplenomegaly bowel sounds are present, Rectal; exam not done this was done per primary care, no external hemorrhoids, Extremities; no clubbing cyanosis or edema skin warm and dry, Neuropsych; mood and affect appropriate       Assessment & Plan:   #57 56 year old white male with 1 month history of intermittent nausea improved with by mouth intake, and epigastric discomfort-rule out gastritis or peptic ulcer disease #2 migratory lower abdominal pains -etiology not clear more consistent with IBS type symptoms #3 self-limited episode of hematochezia-rule out internal hemorrhoids or local anal  rectal source #4 history of adenomatous colon polyps-up-to-date with colonoscopy last done July 2015 and due for 5 year interval follow-up #5 mild diverticulosis noted on prior colonoscopy  Plan; start Protonix 40 mg by mouth every morning Patient will be scheduled for upper endoscopy with Dr. Ardis Hughs. Procedure discussed in detail with patient including risks and benefits and he is agreeable to proceed. Check H. pylori antibody We discussed possible colonoscopy, which is not due until 2020. Patient was advised, should he have recurrent rectal bleeding that we will need to proceed with sooner colonoscopy as well.  Deashia Soule S Tyaisha Cullom PA-C 04/01/2017   Cc: Tonia Ghent, MD

## 2017-04-01 NOTE — Progress Notes (Signed)
CPE- See plan.  Routine anticipatory guidance given to patient.  See health maintenance.  The possibility exists that previously documented standard health maintenance information may have been brought forward from a previous encounter into this note.  If needed, that same information has been updated to reflect the current situation based on today's encounter.   Tetanus 2015 Flu shot 2018 PNA not due.  Shingles d/w pt. Due at 81.  PSA wnl 2018. D/w pt.  Colonoscopy 2015 Living will d/w pt. Prev done. If incapacitated, would have his wife speak for him.  Diet and exercise d/w pt. Encouraged both.  HCV and HIV neg.  D/w pt.   Labs d/w pt.   Smoked for 35 years.  D/w pt about lung cancer screening. Referred.    Low testosterone on replacement.   No ADE on replacement.  He can tell a drop off right before the next injection, with fatigue.  Working out helps with his energy level.  Risk and benefit d/w pt.  No contraindication currently.  Labs d/w pt.    Some snoring, doesn't wake gasping for air.  Sleeping okay per patient report but wife had heard him stop breathing occ in the past.  He raised the head of his bed and that helped.  Weight is usually in the upper 240s in the meantime.  He has been exercising until he got sick recently.  D/w pt about weight mgmt in long term.  If not better with weight reduction, then we can refer to pulmonary. No high risk daytime events.    He is seeing GI today.  Still with occ RLQ twinges.  Still with some nausea.  No more blood in stool.  BMs are back to normal.  No vomiting.  No black stools.  He one episode of a hot flash last week, but that was an isolated event.  No night sweats.  His abd wall is sore after working on a section of fencing this past weekend.    PMH and SH reviewed  Meds, vitals, and allergies reviewed.   ROS: Per HPI.  Unless specifically indicated otherwise in HPI, the patient denies:  General: fever. Eyes: acute vision  changes ENT: sore throat Cardiovascular: chest pain Respiratory: SOB GI: vomiting GU: dysuria Musculoskeletal: acute back pain Derm: acute rash Neuro: acute motor dysfunction Psych: worsening mood Endocrine: polydipsia Heme: bleeding Allergy: hayfever  GEN: nad, alert and oriented HEENT: mucous membranes moist NECK: supple w/o LA CV: rrr. PULM: ctab, no inc wob ABD: soft, +bs EXT: no edema SKIN: no acute rash

## 2017-04-01 NOTE — Patient Instructions (Addendum)
Rosaria Ferries will call about your referral. Take care.  Glad to see you.  I'll await the GI notes.

## 2017-04-02 DIAGNOSIS — R0683 Snoring: Secondary | ICD-10-CM | POA: Insufficient documentation

## 2017-04-02 NOTE — Assessment & Plan Note (Signed)
Some snoring, doesn't wake gasping for air.  Sleeping okay per patient report but wife had heard him stop breathing occ in the past.  He raised the head of his bed and that helped.  Weight is usually in the upper 240s in the meantime.  He has been exercising until he got sick recently.  D/w pt about weight mgmt in long term.  If not better with weight reduction, then we can refer to pulmonary. No high risk daytime events.

## 2017-04-02 NOTE — Assessment & Plan Note (Addendum)
Continue replacement. Risk versus benefit discussed with patient. He has some fatigue that improved with tx.

## 2017-04-02 NOTE — Assessment & Plan Note (Signed)
Living will d/w pt. Prev done. If incapacitated, would have his wife speak for him.  

## 2017-04-02 NOTE — Progress Notes (Signed)
I agree with the above note, plan 

## 2017-04-02 NOTE — Assessment & Plan Note (Signed)
Tetanus 2015 Flu shot 2018 PNA not due.  Shingles d/w pt. Due at 34.  PSA wnl 2018. D/w pt.  Colonoscopy 2015 Living will d/w pt. Prev done. If incapacitated, would have his wife speak for him.  Diet and exercise d/w pt. Encouraged both.  HCV and HIV neg.  D/w pt.   Labs d/w pt.   Smoked for 35 years.  D/w pt about lung cancer screening. Referred.

## 2017-04-02 NOTE — Assessment & Plan Note (Signed)
With f/u with GI pending.  I'll defer, await notes.  He agrees.  Benign abd exam today.

## 2017-04-08 ENCOUNTER — Other Ambulatory Visit: Payer: Self-pay | Admitting: Acute Care

## 2017-04-08 DIAGNOSIS — Z87891 Personal history of nicotine dependence: Secondary | ICD-10-CM

## 2017-04-08 DIAGNOSIS — Z122 Encounter for screening for malignant neoplasm of respiratory organs: Secondary | ICD-10-CM

## 2017-04-17 ENCOUNTER — Ambulatory Visit: Payer: Self-pay

## 2017-04-17 ENCOUNTER — Ambulatory Visit
Admission: RE | Admit: 2017-04-17 | Discharge: 2017-04-17 | Disposition: A | Discharge status: Home / Self Care | Payer: BLUE CROSS/BLUE SHIELD | Source: Ambulatory Visit | Attending: Acute Care | Admitting: Acute Care

## 2017-04-17 ENCOUNTER — Ambulatory Visit: Payer: BLUE CROSS/BLUE SHIELD

## 2017-04-17 ENCOUNTER — Telehealth: Payer: Self-pay | Admitting: Acute Care

## 2017-04-17 ENCOUNTER — Ambulatory Visit (INDEPENDENT_AMBULATORY_CARE_PROVIDER_SITE_OTHER): Payer: BLUE CROSS/BLUE SHIELD | Admitting: Acute Care

## 2017-04-17 ENCOUNTER — Encounter: Payer: Self-pay | Admitting: Acute Care

## 2017-04-17 DIAGNOSIS — Z122 Encounter for screening for malignant neoplasm of respiratory organs: Secondary | ICD-10-CM

## 2017-04-17 DIAGNOSIS — Z87891 Personal history of nicotine dependence: Secondary | ICD-10-CM

## 2017-04-17 DIAGNOSIS — I712 Thoracic aortic aneurysm, without rupture, unspecified: Secondary | ICD-10-CM

## 2017-04-17 NOTE — Telephone Encounter (Signed)
Received phone call from Bryan Lemma NP from [facility]. Wanted to make Dr Damita Dunnings aware of pt's results. She stated pt was negative for lung ca but has a 4.9 ascending thoracic aortic aneursym. Recommended a scan Q 6 months. She stated pt is aware.

## 2017-04-17 NOTE — Telephone Encounter (Signed)
I have called the patient with the results of his low-dose screening CT. I explained that his scan was read as a lung RADS 2. Lung RADS 2: nodules that are benign in appearance and behavior with a very low likelihood of becoming a clinically active cancer due to size or lack of growth. Recommendation per radiology is for a repeat LDCT in 12 months. We will do annual screening starting in October 2019.   The additional finding of a 4.9 cm ascending thoracic aortic aneurysmal dilation was noted during the scan. Both the patient and his primary care physician Dr. Damita Dunnings ( Office notified) have been notified. The patient is going to follow-up with Dr. Damita Dunnings regarding any additional follow-up he might need for the aneurysm. Recommendation per radiology is semiannual screening to ensure stability, and/or cardiovascular thoracic surgery evaluation. The results of this scan have been faxed to Dr. Josefine Class office.

## 2017-04-17 NOTE — Progress Notes (Signed)
Shared Decision Making Visit Lung Cancer Screening Program [id number])   Eligibility:  Age 56 y.o.  Pack Years Smoking History Calculation 51 pack years smoking history (# packs/per year x # years smoked)  Recent History of coughing up blood  No  Unexplained weight loss? No ( >Than 15 pounds within the last 6 months )  Prior History Lung / other cancer No (Diagnosis within the last 5 years already requiring surveillance chest CT Scans).  Smoking Status Former Smoker  Former Smokers: Years since quit: 8 years ago  Quit Date: 10/29/2008  Visit Components:  Discussion included one or more decision making aids. yes  Discussion included risk/benefits of screening. yes  Discussion included potential follow up diagnostic testing for abnormal scans. yes  Discussion included meaning and risk of over diagnosis. yes  Discussion included meaning and risk of False Positives. yes  Discussion included meaning of total radiation exposure. yes  Counseling Included:  Importance of adherence to annual lung cancer LDCT screening. yes  Impact of comorbidities on ability to participate in the program. yes  Ability and willingness to under diagnostic treatment. yes  Smoking Cessation Counseling:  Current Smokers:   Discussed importance of smoking cessation. Not applicable former smoker  Information about tobacco cessation classes and interventions provided to patient. yes  Patient provided with "ticket" for LDCT Scan. yes  Symptomatic Patient. no  Counseling  Diagnosis Code: Tobacco Use Z72.0  Asymptomatic Patient yes  Counseling (Intermediate counseling: > three minutes counseling) X9147  Former Smokers:   Discussed the importance of maintaining cigarette abstinence. yes  Diagnosis Code: Personal History of Nicotine Dependence. W29.562  Information about tobacco cessation classes and interventions provided to patient. Yes  Patient provided with "ticket" for LDCT Scan.  yes  Written Order for Lung Cancer Screening with LDCT placed in Epic. Yes (CT Chest Lung Cancer Screening Low Dose W/O CM) [id number] Z12.2-Screening of respiratory organs Z87.891-Personal history of nicotine dependence  I spent 25 minutes of face to face time with Steven [name] discussing the risks and benefits of lung cancer screening. We viewed a power point together that explained in detail the above noted topics. We took the time to pause the power point at intervals to allow for questions to be asked and answered to ensure understanding. We discussed that he had taken the single most powerful action possible to decrease his risk of developing lung cancer when he quit smoking. I counseled Steven [name] to remain smoke free, and to contact me if he ever had the desire to smoke again so that I can provide resources and tools to help support the effort to remain smoke free. We discussed the time and location of the scan, and that either  Doroteo Glassman RN or I will call with the results within  24-48 hours of receiving them. Steven [name] has my card and contact information in the event he needs to speak with me, in addition to a copy of the power point we reviewed as a resource. Patient verbalized understanding of all of the above and had no further questions upon leaving the office.     I explained to the patient that there has been a high incidence of coronary artery disease noted on these exams. I explained that this is a non-gated exam therefore degree or severity cannot be determined. This patient is not on statin therapy at present per the Epic records. I have asked the patient to follow-up with their PCP regarding any incidental finding of coronary artery  disease and management with diet or medication as they feel is clinically indicated. The patient verbalized understanding of the above and had no further questions.     Magdalen Spatz, NP 04/17/2017

## 2017-04-18 ENCOUNTER — Encounter: Payer: Self-pay | Admitting: Gastroenterology

## 2017-04-18 ENCOUNTER — Other Ambulatory Visit: Payer: Self-pay | Admitting: Acute Care

## 2017-04-18 DIAGNOSIS — Z87891 Personal history of nicotine dependence: Secondary | ICD-10-CM

## 2017-04-18 DIAGNOSIS — I712 Thoracic aortic aneurysm, without rupture, unspecified: Secondary | ICD-10-CM

## 2017-04-18 DIAGNOSIS — Z122 Encounter for screening for malignant neoplasm of respiratory organs: Secondary | ICD-10-CM

## 2017-04-18 HISTORY — DX: Thoracic aortic aneurysm, without rupture, unspecified: I71.20

## 2017-04-18 HISTORY — DX: Thoracic aortic aneurysm, without rupture: I71.2

## 2017-04-18 MED ORDER — PRAVASTATIN SODIUM 20 MG PO TABS: 20.0000 mg | ORAL_TABLET | Freq: Every day | ORAL | 3 refills | Status: DC

## 2017-04-18 NOTE — Telephone Encounter (Addendum)
Call pt.  We need to have him see cardiothoracic surgery re: the aneurysm and we should start him on a statin since he has coronary calcifications noted.  I realize he had aches on lipitor. I would try pravastatin as it is less likely to cause aches.    I put in the referral for surgery.  I sent the pravastatin rx.  Recheck labs in about 2 months (I put in the orders), assuming he can tolerate the medicine.  If he has aches, then cut the dose in half.  If still with aches, then stop med and update [place].  I put in the reminder for the f/u CTA in case [facility] doesn't order it in 6 months.    Thanks.

## 2017-04-18 NOTE — Addendum Note (Signed)
Addended by: Tonia Ghent on: 04/18/2017 06:45 AM   Modules accepted: Orders

## 2017-04-18 NOTE — Telephone Encounter (Signed)
Patient advised.  Lab appt scheduled.  

## 2017-04-22 ENCOUNTER — Telehealth: Payer: Self-pay | Admitting: Family Medicine

## 2017-04-22 DIAGNOSIS — I712 Thoracic aortic aneurysm, without rupture, unspecified: Secondary | ICD-10-CM

## 2017-04-22 NOTE — Telephone Encounter (Signed)
Patient called [facility] to check on his Referral. Referral went to the [facility] instead of coming to [facility]. Please place New Referral for Thoracic Surgery , the referral that was placed is for Vascular, pls advise which one the patient needs. He has an endoscopy set up for next Wednesday, November 7th and is asking if he should keep that appointment for that procedure.

## 2017-04-22 NOTE — Telephone Encounter (Signed)
Placed referral.  Okay to proceed with endoscopy.  Thanks.

## 2017-04-22 NOTE — Telephone Encounter (Signed)
Referral sent electronically to [facility], patient called and told they would call him to set up consult. Patient told it was ok to have Endoscopy per Dr Damita Dunnings.

## 2017-05-01 ENCOUNTER — Ambulatory Visit (AMBULATORY_SURGERY_CENTER): Payer: BLUE CROSS/BLUE SHIELD | Admitting: Gastroenterology

## 2017-05-01 ENCOUNTER — Encounter: Payer: Self-pay | Admitting: Gastroenterology

## 2017-05-01 VITALS — BP 123/75 | HR 81 | Temp 97.3°F | Resp 16 | Ht 74.0 in | Wt 247.0 lb

## 2017-05-01 DIAGNOSIS — K2970 Gastritis, unspecified, without bleeding: Secondary | ICD-10-CM | POA: Diagnosis not present

## 2017-05-01 DIAGNOSIS — R1013 Epigastric pain: Secondary | ICD-10-CM

## 2017-05-01 DIAGNOSIS — K2990 Gastroduodenitis, unspecified, without bleeding: Secondary | ICD-10-CM

## 2017-05-01 DIAGNOSIS — K219 Gastro-esophageal reflux disease without esophagitis: Secondary | ICD-10-CM | POA: Diagnosis not present

## 2017-05-01 MED ORDER — SODIUM CHLORIDE 0.9 % IV SOLN: 500.0000 mL | INTRAVENOUS | Status: DC

## 2017-05-01 MED ORDER — RANITIDINE HCL 150 MG PO TABS: 150.0000 mg | ORAL_TABLET | Freq: Every day | ORAL | 11 refills | Status: DC

## 2017-05-01 NOTE — Progress Notes (Signed)
A and O x3. Report to RN. Tolerated MAC anesthesia well.Teeth unchanged after procedure.

## 2017-05-01 NOTE — Progress Notes (Signed)
  FoPt's states no medical or surgical changes since previsit or office visit.

## 2017-05-01 NOTE — Patient Instructions (Addendum)
YOU HAD AN ENDOSCOPIC PROCEDURE TODAY AT [facility]:   Refer to the procedure report that was given to you for any specific questions about what was found during the examination.  If the procedure report does not answer your questions, please call your gastroenterologist to clarify.  If you requested that your care partner not be given the details of your procedure findings, then the procedure report has been included in a sealed envelope for you to review at your convenience later.  YOU SHOULD EXPECT: Some feelings of bloating in the abdomen. Passage of more gas than usual.  Walking can help get rid of the air that was put into your GI tract during the procedure and reduce the bloating. If you had a lower endoscopy (such as a colonoscopy or flexible sigmoidoscopy) you may notice spotting of blood in your stool or on the toilet paper. If you underwent a bowel prep for your procedure, you may not have a normal bowel movement for a few days.  Please Note:  You might notice some irritation and congestion in your nose or some drainage.  This is from the oxygen used during your procedure.  There is no need for concern and it should clear up in a day or so.  SYMPTOMS TO REPORT IMMEDIATELY:     Following upper endoscopy (EGD)  Vomiting of blood or coffee ground material  New chest pain or pain under the shoulder blades  Painful or persistently difficult swallowing  New shortness of breath  Fever of 100F or higher  Black, tarry-looking stools  For urgent or emergent issues, a gastroenterologist can be reached at any hour by calling [phone].   DIET:  We do recommend a small meal at first, but then you may proceed to your regular diet.  Drink plenty of fluids but you should avoid alcoholic beverages for 24 hours.  ACTIVITY:  You should plan to take it easy for the rest of today and you should NOT DRIVE or use heavy machinery until tomorrow (because of the sedation medicines  used during the test).    FOLLOW UP: Our staff will call the number listed on your records the next business day following your procedure to check on you and address any questions or concerns that you may have regarding the information given to you following your procedure. If we do not reach you, we will leave a message.  However, if you are feeling well and you are not experiencing any problems, there is no need to return our call.  We will assume that you have returned to your regular daily activities without incident.  If any biopsies were taken you will be contacted by phone or by letter within the next 1-3 weeks.  Please call us at [phone] if you have not heard about the biopsies in 3 weeks.    SIGNATURES/CONFIDENTIALITY: You and/or your care partner have signed paperwork which will be entered into your electronic medical record.  These signatures attest to the fact that that the information above on your After Visit Summary has been reviewed and is understood.  Full responsibility of the confidentiality of this discharge information lies with you and/or your care-partner.    Await biopsy results   Continue Protonix 40 mg daily ,30 min before first meal of the day   Start Ranitidine 150 mg at bedtime -this is over the counter ordered at [address] if cheaper

## 2017-05-01 NOTE — Progress Notes (Signed)
Called to room to assist during endoscopic procedure.  Patient ID and intended procedure confirmed with present staff. Received instructions for my participation in the procedure from the performing physician.  

## 2017-05-01 NOTE — Op Note (Signed)
[facility] Patient Name: Steven [name] Procedure Date: 05/01/2017 8:47 AM MRN: [id number] Endoscopist: Milus Banister , MD Age: 56 Referring MD:  Date of Birth: 09-10-1960 Gender: Male Account #: [id number] Procedure:                Upper GI endoscopy Indications:              Generalized abdominal pain, Nausea Medicines:                Monitored Anesthesia Care Procedure:                Pre-Anesthesia Assessment:                           - Prior to the procedure, a History and Physical                            was performed, and patient medications and                            allergies were reviewed. The patient's tolerance of                            previous anesthesia was also reviewed. The risks                            and benefits of the procedure and the sedation                            options and risks were discussed with the patient.                            All questions were answered, and informed consent                            was obtained. Prior Anticoagulants: The patient has                            taken no previous anticoagulant or antiplatelet                            agents. ASA Grade Assessment: II - A patient with                            mild systemic disease. After reviewing the risks                            and benefits, the patient was deemed in                            satisfactory condition to undergo the procedure.                           After obtaining informed consent, the endoscope was  passed under direct vision. Throughout the                            procedure, the patient's blood pressure, pulse, and                            oxygen saturations were monitored continuously. The                            Endoscope was introduced through the mouth, and                            advanced to the second part of duodenum. The upper                            GI endoscopy was  accomplished without difficulty.                            The patient tolerated the procedure well. Scope In: Scope Out: Findings:                 The esophagus was normal.                           Mild inflammation characterized by erythema and                            granularity was found in the entire examined                            stomach. Biopsies were taken with a cold forceps                            for histology.                           The examined duodenum was normal. Complications:            No immediate complications. Estimated blood loss:                            None. Estimated Blood Loss:     Estimated blood loss: none. Impression:               - Normal esophagus.                           - Mild gastritis, biopsied.                           - Normal examined duodenum. Recommendation:           - Patient has a contact number available for                            emergencies. The signs and symptoms of potential  delayed complications were discussed with the                            patient. Return to normal activities tomorrow.                            Written discharge instructions were provided to the                            patient.                           - Resume previous diet.                           - Continue present medications. Continue protonix                            40mg  pill 20-30 min before breakfast meal daily.                            Also start ranitidine 150mg  pill at bedtime nightly                            (OTC).                           - Await pathology results. Milus Banister, MD 05/01/2017 9:07:40 AM This report has been signed electronically.

## 2017-05-01 NOTE — Addendum Note (Signed)
Addended by: Shanon Brow on: 05/01/2017 10:05 AM   Modules accepted: Orders

## 2017-05-02 ENCOUNTER — Telehealth: Payer: Self-pay | Admitting: *Deleted

## 2017-05-02 NOTE — Telephone Encounter (Signed)
  Follow up Call-  Call back number 05/01/2017  Post procedure Call Back phone  # [phone]  Permission to leave phone message Yes  Some recent data might be hidden     Patient questions:  Do you have a fever, pain , or abdominal swelling? No. Pain Score  0 *  Have you tolerated food without any problems? Yes.    Have you been able to return to your normal activities? Yes.    Do you have any questions about your discharge instructions: Diet   No. Medications  No. Follow up visit  No.  Do you have questions or concerns about your Care? No.  Actions: * If pain score is 4 or above: No action needed, pain <4.

## 2017-05-07 ENCOUNTER — Encounter: Payer: Self-pay | Admitting: Gastroenterology

## 2017-05-14 ENCOUNTER — Encounter: Payer: Self-pay | Admitting: Thoracic Surgery (Cardiothoracic Vascular Surgery)

## 2017-05-14 ENCOUNTER — Other Ambulatory Visit: Payer: Self-pay

## 2017-05-14 ENCOUNTER — Institutional Professional Consult (permissible substitution) (INDEPENDENT_AMBULATORY_CARE_PROVIDER_SITE_OTHER): Payer: BLUE CROSS/BLUE SHIELD | Admitting: Thoracic Surgery (Cardiothoracic Vascular Surgery)

## 2017-05-14 VITALS — BP 122/78 | HR 87 | Ht 74.0 in | Wt 242.0 lb

## 2017-05-14 DIAGNOSIS — I712 Thoracic aortic aneurysm, without rupture: Secondary | ICD-10-CM

## 2017-05-14 DIAGNOSIS — I7121 Aneurysm of the ascending aorta, without rupture: Secondary | ICD-10-CM

## 2017-05-14 NOTE — Progress Notes (Signed)
PCP is Tonia Ghent, MD Referring Provider is Tonia Ghent, MD  Chief Complaint  Patient presents with  . New Patient (Initial Visit)    thoracic aortic aneurysm 4.9 cm, CT chest 04/17/2017    HPI: Steven [name] is sent for consultation regarding an ascending aortic aneurysm  Steven [name] is a 56 year old man with a history of heart murmur, hyperlipidemia, reflux, colon polyp, positive PPD, and nephrolithiasis.  He has a 51-pack-year history of tobacco abuse but quit smoking about 10 years ago.  He recently has been having issues with nausea and epigastric discomfort.  He also has had some migratory lower abdominal pains.  He had an episode of hematochezia.  He saw Dr. Damita Dunnings.  A GI workup was initiated along with Dr. Ardis Hughs.  He also was referred for low-dose CT for screening for lung cancer given his smoking history.  The CT did not show any suspicious pulmonary nodules but did show a 4.9 cm ascending aneurysm.  There also was evidence of calcified plaque in the LAD.   Past Medical History:  Diagnosis Date  . Aneurysm of aorta ([place]) 03/2017  . At risk for sleep apnea    STOP-BANG= 4     SENT TO PCP 04-15-2014  . ED (erectile dysfunction) of non-organic origin   . Epididymal cyst    LEFT  . GERD (gastroesophageal reflux disease)   . Heart murmur mild mvp  -- asymptomatic   per pt echo 2005  . History of adenomatous polyp of colon   . 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 Hyperlipidemia   . Hypogonadism male    Previously evaluated by Dr. Reece Agar with URO for benign noduarity in left hemiscrotum (2008) felt to be variant of normal.  . Left nephrolithiasis   . Left ureteral calculus     Past Surgical History:  Procedure Laterality Date  . COLONOSCOPY WITH PROPOFOL  01-06-2014   POLYPECTOMY  . CYSTOSCOPY WITH RETROGRADE PYELOGRAM, URETEROSCOPY AND STENT PLACEMENT Left 04/19/2014   Procedure: CYSTOSCOPY WITH RETROGRADE PYELOGRAM with  interpretation, URETEROSCOPY, stone extraction with basket, AND STENT PLACEMENT, implantation of backstop;  Surgeon: Ailene Rud, MD;  Location: [facility];  Service: Urology;  Laterality: Left;  . EXCISION SEBACEOUS CYST X3 OF BACK  03-13-2010  . HOLMIUM LASER APPLICATION Left 32/99/2426   Procedure: HOLMIUM LASER APPLICATION;  Surgeon: Ailene Rud, MD;  Location: [facility];  Service: Urology;  Laterality: Left;  . NASAL SEPTUM SURGERY  09-21-2002  . REFRACTIVE SURGERY  1994    Family History  Problem Relation Age of Onset  . Cancer Mother        Renal CA, treated 1993  . Parkinsonism Father   . Alzheimer's disease Father   . Diabetes Brother   . Diabetes Other   . Kidney disease Other   . Colon cancer Neg Hx   . Prostate cancer Neg Hx   . Esophageal cancer Neg Hx   . Rectal cancer Neg Hx   . Stomach cancer Neg Hx   . Pancreatic cancer Neg Hx     Social History Social History   Tobacco Use  . Smoking status: Former Smoker    Packs/day: 1.50    Years: 34.00    Pack years: 51.00    Types: Cigarettes    Last attempt to quit: 10/29/2008    Years since quitting: 8.5  . Smokeless  tobacco: Never Used  Substance Use Topics  . Alcohol use: Yes    Alcohol/week: 0.0 oz    Comment: Rare  . Drug use: No    Current Outpatient Medications  Medication Sig Dispense Refill  . acetaminophen (TYLENOL) 500 MG tablet Take 500 mg by mouth every 6 (six) hours as needed.    Marland Kitchen aspirin 81 MG tablet Take 81 mg by mouth daily.      . pantoprazole (PROTONIX) 40 MG tablet Take 1 tablet (40 mg total) by mouth daily. 90 tablet 3  . pravastatin (PRAVACHOL) 20 MG tablet Take 1 tablet (20 mg total) by mouth daily. 90 tablet 3  . ranitidine (ZANTAC) 150 MG tablet Take 1 tablet (150 mg total) at bedtime by mouth. 30 tablet 11  . sildenafil (REVATIO) 20 MG tablet TAKE 3-5 TABLETS BY MOUTH DAILY AS NEEDED 50 tablet prn  . testosterone cypionate  (DEPOTESTOSTERONE CYPIONATE) 200 MG/ML injection INJECT 1.6ML INTO THE MUSCLE ONCE EVERY 14 DAYS. 10 mL 0  . fexofenadine (ALLEGRA) 180 MG tablet Take 180 mg by mouth as needed for allergies or rhinitis.     No current facility-administered medications for this visit.     Allergies  Allergen Reactions  . Lipitor [Atorvastatin] Other (See Comments)    Aches  . Naproxen Sodium Swelling  . Penicillins Swelling  . Amoxicillin Rash  . Erythromycin Rash    Review of Systems  Constitutional: Positive for unexpected weight change (Lost 6 pounds in 3 months). Negative for activity change and appetite change.  HENT: Negative for trouble swallowing and voice change.   Respiratory: Positive for chest tightness (Self-limited discomfort at rest lasting about a minute).   Cardiovascular: Positive for chest pain (Associated with palpitations) and palpitations. Negative for leg swelling.       Heart murmur.  Echo in the past showed mitral valve prolapse  Gastrointestinal: Positive for abdominal pain, blood in stool (One episode) and nausea.  Genitourinary: Negative for difficulty urinating.       Kidney stones  Musculoskeletal: Negative for arthralgias and myalgias.  Neurological: Positive for dizziness. Negative for syncope.  Hematological: Negative for adenopathy. Does not bruise/bleed easily.  All other systems reviewed and are negative.   BP 122/78   Pulse 87   Ht 6\' 2"  (1.88 m)   Wt 242 lb (109.8 kg)   SpO2 98%   BMI 31.07 kg/m  Physical Exam  Constitutional: He is oriented to person, place, and time. He appears well-developed and well-nourished. No distress.  HENT:  Head: Normocephalic and atraumatic.  Mouth/Throat: No oropharyngeal exudate.  Eyes: Conjunctivae and EOM are normal. No scleral icterus.  Neck: Neck supple. No thyromegaly present.  No carotid bruits  Cardiovascular: Normal rate and regular rhythm.  Murmur (2/6 systolic murmur right upper sternal border)  heard. Pulmonary/Chest: Effort normal and breath sounds normal. No respiratory distress. He has no wheezes. He has no rales.  Abdominal: Soft. He exhibits no distension. There is no tenderness.  Musculoskeletal: He exhibits no edema or deformity.  Lymphadenopathy:    He has no cervical adenopathy.  Neurological: He is alert and oriented to person, place, and time. No cranial nerve deficit.  Skin: Skin is warm and dry.  Vitals reviewed.    Diagnostic Tests: CT CHEST WITHOUT CONTRAST LOW-DOSE FOR LUNG CANCER SCREENING  TECHNIQUE: Multidetector CT imaging of the chest was performed following the standard protocol without IV contrast.  COMPARISON:  No priors.  FINDINGS: Cardiovascular: Heart size is normal. There is  no significant pericardial fluid, thickening or pericardial calcification. There is aortic atherosclerosis, as well as atherosclerosis of the great vessels of the mediastinum and the coronary arteries, including calcified atherosclerotic plaque in the left anterior descending coronary artery. Calcifications of the aortic valve. Aneurysmal dilatation of the ascending thoracic aorta measuring up to 4.9 cm in diameter.  Mediastinum/Nodes: No pathologically enlarged mediastinal are hilar lymph nodes. Please note that accurate exclusion of hilar adenopathy is limited on noncontrast CT scans. Esophagus is unremarkable in appearance. No axillary lymphadenopathy.  Lungs/Pleura: Tiny pulmonary nodules are noted, the largest of which is in the subpleural aspect of the right middle lobe (axial image 197 of series 3) with a volume derived mean diameter of only 4.5 mm. No larger more suspicious appearing pulmonary nodules or masses are noted. No acute consolidative airspace disease. No pleural effusions. Mild diffuse bronchial wall thickening with mild centrilobular and paraseptal emphysema.  Upper Abdomen: Diffuse low attenuation throughout the visualized hepatic  parenchyma, indicative of hepatic steatosis. Tiny nonobstructive calculi in the upper pole collecting system of left kidney measuring up to 3 mm.  Musculoskeletal: There are no aggressive appearing lytic or blastic lesions noted in the visualized portions of the skeleton.  IMPRESSION: 1. Lung-RADS 2S, benign appearance or behavior. Continue annual screening with low-dose chest CT without contrast in 12 months. 2. The "S" modifier above refers to potentially clinically significant non lung cancer related findings. Specifically, there is aortic atherosclerosis, in addition to left anterior descending coronary artery disease. Please note that although the presence of coronary artery calcium documents the presence of coronary artery disease, the severity of this disease and any potential stenosis cannot be assessed on this non-gated CT examination. Assessment for potential risk factor modification, dietary therapy or pharmacologic therapy may be warranted, if clinically indicated. 3. In addition, there is aneurysmal dilatation of the ascending thoracic aorta which measures 4.9 cm in diameter. Ascending thoracic aortic aneurysm. Recommend semi-annual imaging followup by CTA or MRA and referral to cardiothoracic surgery if not already obtained. This recommendation follows 2010 ACCF/AHA/AATS/ACR/ASA/SCA/SCAI/SIR/STS/SVM Guidelines for the Diagnosis and Management of Patients With Thoracic Aortic Disease. Circulation. 2010; 121: R740-C144. 4. Mild diffuse bronchial wall thickening with mild centrilobular and paraseptal emphysema; imaging findings suggestive of underlying COPD. 5. Nonobstructive calculi noted in the upper pole collecting system of left kidney measuring up to 3 mm.  Aortic Atherosclerosis (ICD10-I70.0), Aortic Aneurysm NOS (ICD10-I71.9) and Emphysema (ICD10-J43.9).   Electronically Signed   By: Vinnie Langton M.D.   On: 04/17/2017 11:43 I personally reviewed the  CT images and concur with the findings noted above  Impression: Steven [name] is a 56 year old gentleman with a remote history of tobacco abuse, hyperlipidemia, reflux, and a history of a positive PPD in the past.  He recently had low-dose CT screening for lung cancer due to his 50-pack-year history of smoking.  He did not have any suspicious lung nodules was found to have a 4.9 cm ascending aneurysm.  Also had some evidence of calcified plaque in his LAD.  Ascending aneurysm- 4.9 cm.  No indication for surgery at this time which would be 5.5-6 cm.  Does need close follow-up with imaging every 6 months.  To avoid potential cumulative doses of radiation over time we will alternate MR angios and CT angios.  We will continue to use CT angios annually as it will also allow [place] to evaluate for any potential for some lung nodules.  He is on a statin and has normal blood pressure.  Coronary artery disease-no known history of CAD.  Does have evidence of that on his CT.  He does not have any anginal pain.  He does have some chest pain but this is not really consistent with angina.  He is on a statin and aspirin.  Tobacco abuse-quit in 2010  Plan: Return in 6 months with CT angiogram  [name], MD [facility] [phone]

## 2017-06-11 ENCOUNTER — Other Ambulatory Visit: Payer: Self-pay | Admitting: Family Medicine

## 2017-06-11 NOTE — Telephone Encounter (Signed)
Electronic refill request. Testosterone Last office visit:   04/01/2017 Last Filled:    10 mL 0 02/07/2017  Please advise.

## 2017-06-12 NOTE — Telephone Encounter (Signed)
Please call in.  Thanks.   

## 2017-06-12 NOTE — Telephone Encounter (Signed)
Rx called to pharmacy as instructed. 

## 2017-06-24 ENCOUNTER — Other Ambulatory Visit (INDEPENDENT_AMBULATORY_CARE_PROVIDER_SITE_OTHER): Payer: BLUE CROSS/BLUE SHIELD

## 2017-06-24 DIAGNOSIS — I712 Thoracic aortic aneurysm, without rupture, unspecified: Secondary | ICD-10-CM

## 2017-06-24 LAB — HEPATIC FUNCTION PANEL
ALT: 37 U/L (ref 0–53)
AST: 29 U/L (ref 0–37)
Albumin: 4.3 g/dL (ref 3.5–5.2)
Alkaline Phosphatase: 71 U/L (ref 39–117)
BILIRUBIN DIRECT: 0.1 mg/dL (ref 0.0–0.3)
BILIRUBIN TOTAL: 0.7 mg/dL (ref 0.2–1.2)
Total Protein: 6.9 g/dL (ref 6.0–8.3)

## 2017-06-24 LAB — LIPID PANEL
CHOL/HDL RATIO: 5
Cholesterol: 188 mg/dL (ref 0–200)
HDL: 37.7 mg/dL — ABNORMAL LOW (ref >39.00)
LDL CALC: 114 mg/dL — AB (ref 0–99)
NonHDL: 150.12
TRIGLYCERIDES: 180 mg/dL — AB (ref 0.0–149.0)
VLDL: 36 mg/dL (ref 0.0–40.0)

## 2017-07-01 ENCOUNTER — Ambulatory Visit (INDEPENDENT_AMBULATORY_CARE_PROVIDER_SITE_OTHER): Payer: BLUE CROSS/BLUE SHIELD | Admitting: Family Medicine

## 2017-07-01 ENCOUNTER — Encounter: Payer: Self-pay | Admitting: Family Medicine

## 2017-07-01 VITALS — BP 136/84 | HR 67 | Temp 98.4°F | Wt 248.2 lb

## 2017-07-01 DIAGNOSIS — L821 Other seborrheic keratosis: Secondary | ICD-10-CM | POA: Diagnosis not present

## 2017-07-01 DIAGNOSIS — R109 Unspecified abdominal pain: Secondary | ICD-10-CM

## 2017-07-01 DIAGNOSIS — I712 Thoracic aortic aneurysm, without rupture, unspecified: Secondary | ICD-10-CM

## 2017-07-01 NOTE — Patient Instructions (Addendum)
Okay to wean down off the protonix slowly and see how you do.  Update GI as needed.  The spot (likely seborrheic keratosis) should blister up and then come off.   Keep it clean and covered as needed.   Take care.  Glad to see you.

## 2017-07-02 NOTE — Assessment & Plan Note (Signed)
Likely irritated seborrheic keratosis on the face.  Discussed with patient about options.  It is relatively small and superficial.  It would likely best be treated by freezing with liquid nitrogen instead of shave.  Options discussed with patient.  He consented for liquid nitrogen treatment.  Lesion frozen and thawed x3.  No complications.  Tolerated well.  Also froze the 2 skin tags on the face.  Routine post cryotherapy instructions given to patient.  Update me as needed.  We cannot guarantee full resolution of the lesions with one round of liquid nitrogen treatment.  We can always retreat the lesions later on shave them off if needed. He understood.

## 2017-07-02 NOTE — Progress Notes (Signed)
His GI symptoms are resolved and he wanted to know if it would be possible to wean down off of Protonix.  Discussed with patient about previous endoscopy results.  See after visit summary.  Try to wean down on PPI.  He has seen cardiothoracic surgery in the meantime and has routine follow-up planned about his aortic changes.  Discussed about previous imaging and cardiothoracic office visit note.  He has no chest pain.  Skin lesions on face.  Wanted evaluation and treatment.  Irritated lesion on the right cheek.  Present for years.  Also with multiple irritated skin tags on the face.  Meds, vitals, and allergies reviewed.   ROS: Per HPI unless specifically indicated in ROS section   nad ncat Irritated lesion on the right cheek noted.  8 x 5 mm.  It appears to be irritated seborrheic keratosis. Irritated skin tag noted on each side of the face, just lateral to the nose. Multiple non-irritated seborrheic keratoses noted on the trunk.

## 2017-07-02 NOTE — Assessment & Plan Note (Signed)
Resolved.  He wanted to try to taper off of PPI and this is reasonable.  See after visit summary. >15 minutes spent in face to face time with patient, >50% spent in counselling or coordination of care, see above regarding discussion of this and aortic changes.

## 2017-07-02 NOTE — Assessment & Plan Note (Signed)
Continue current medications as is.  No chest pain.  See above.

## 2017-10-07 ENCOUNTER — Other Ambulatory Visit: Payer: Self-pay | Admitting: *Deleted

## 2017-10-07 DIAGNOSIS — I712 Thoracic aortic aneurysm, without rupture, unspecified: Secondary | ICD-10-CM

## 2017-10-25 ENCOUNTER — Other Ambulatory Visit: Payer: Self-pay | Admitting: Family Medicine

## 2017-10-25 NOTE — Telephone Encounter (Signed)
Electronic refill request. Testosterone Last office visit:   07/01/17 Last Filled:    10 mL 0 06/12/2017  Please advise.

## 2017-10-27 MED ORDER — TESTOSTERONE CYPIONATE 200 MG/ML IM SOLN: 320.0000 mg | INTRAMUSCULAR | 0 refills | Status: DC

## 2017-10-27 NOTE — Telephone Encounter (Signed)
Sent. Thanks.   

## 2017-11-12 ENCOUNTER — Ambulatory Visit
Admission: RE | Admit: 2017-11-12 | Discharge: 2017-11-12 | Disposition: A | Discharge status: Home / Self Care | Payer: BLUE CROSS/BLUE SHIELD | Source: Ambulatory Visit | Attending: Thoracic Surgery (Cardiothoracic Vascular Surgery) | Admitting: Thoracic Surgery (Cardiothoracic Vascular Surgery)

## 2017-11-12 DIAGNOSIS — I712 Thoracic aortic aneurysm, without rupture, unspecified: Secondary | ICD-10-CM

## 2017-11-12 MED ORDER — GADOBENATE DIMEGLUMINE 529 MG/ML IV SOLN
20.0000 mL | Freq: Once | INTRAVENOUS | Status: AC | PRN
  Administered 2017-11-12: 20 mL via INTRAVENOUS

## 2017-11-19 ENCOUNTER — Encounter: Payer: Self-pay | Admitting: Thoracic Surgery (Cardiothoracic Vascular Surgery)

## 2017-11-19 ENCOUNTER — Other Ambulatory Visit: Payer: Self-pay

## 2017-11-19 ENCOUNTER — Ambulatory Visit (INDEPENDENT_AMBULATORY_CARE_PROVIDER_SITE_OTHER): Payer: BLUE CROSS/BLUE SHIELD | Admitting: Thoracic Surgery (Cardiothoracic Vascular Surgery)

## 2017-11-19 VITALS — BP 128/80 | HR 74 | Resp 18 | Ht 74.0 in | Wt 254.2 lb

## 2017-11-19 DIAGNOSIS — I712 Thoracic aortic aneurysm, without rupture, unspecified: Secondary | ICD-10-CM

## 2017-11-19 NOTE — Progress Notes (Signed)
Steven [name]       [place] [address]             [phone]       HPI: [name] returns for follow-up regarding his ascending aneurysm  Steven [name] is a 57 year old man with a past history significant for tobacco abuse, heart murmur, hyperlipidemia, gastroesophageal reflux, positive PPD, and nephrolithiasis.  He has 51-pack-year history of smoking before quitting about 10 years ago.  Last fall he saw Dr. Damita [name] regarding some GI complaints.  Dr. Damita [name] referred him for low-dose CT screening given his smoking history.  There were no suspicious pulmonary nodules but he did have a 4.9 cm ascending aneurysm.  He now returns for six-month follow-up visit.  He says he has been having more palpitations recently.  He has some epigastric discomfort that is not exertional in nature.  It often is relieved by getting up and walking around.  He has had a heart murmur for as long as he can remember.  He says his last echocardiogram was several years ago.  Past Medical History:  Diagnosis Date  . Aneurysm of aorta ([place]) 03/2017  . At risk for sleep apnea    STOP-BANG= 4     SENT TO PCP 04-15-2014  . ED (erectile dysfunction) of non-organic origin   . Epididymal cyst    LEFT  . GERD (gastroesophageal reflux disease)   . Heart murmur mild mvp  -- asymptomatic   per pt echo 2005  . History of adenomatous polyp of colon   . 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 Hyperlipidemia   . Hypogonadism male    Previously evaluated by Dr. Reece Agar with URO for benign noduarity in left hemiscrotum (2008) felt to be variant of normal.  . Left nephrolithiasis   . Left ureteral calculus     Current Outpatient Medications  Medication Sig Dispense Refill  . acetaminophen (TYLENOL) 500 MG tablet Take 500 mg by mouth every 6 (six) hours as needed.    Marland Kitchen aspirin 81 MG tablet Take 81 mg by mouth daily.      . pravastatin (PRAVACHOL) 20 MG tablet Take  1 tablet (20 mg total) by mouth daily. 90 tablet 3  . sildenafil (REVATIO) 20 MG tablet TAKE 3-5 TABLETS BY MOUTH DAILY AS NEEDED 50 tablet prn  . testosterone cypionate (DEPOTESTOSTERONE CYPIONATE) 200 MG/ML injection Inject 1.6 mLs (320 mg total) into the muscle every 14 (fourteen) days. 10 mL 0   No current facility-administered medications for this visit.     Physical Exam BP 128/80 (BP Location: Right Arm, Patient Position: Sitting, Cuff Size: Large)   Pulse 74   Resp 18   Ht 6\' 2"  (1.88 m)   Wt 254 lb 3.2 oz (115.3 kg)   SpO2 98% Comment: RA  BMI 32.35 kg/m  57 year old man in no acute distress Alert and oriented x3 with no focal deficits No carotid bruits Cardiac regular rate and rhythm normal S1 and S2 with a 2/6 systolic murmur Lungs clear with equal breath sounds bilaterally Pulses 2+ and symmetric  Diagnostic Tests: MRA CHEST WITH CONTRAST  TECHNIQUE: Multiplanar, multiecho pulse sequences of the chest were obtained with intravenous contrast. Angiographic images of chest were obtained using MRA technique with intravenous contrast.  CONTRAST:  12mL MULTIHANCE GADOBENATE DIMEGLUMINE 529 MG/ML IV SOLN  COMPARISON:  04/17/2017  FINDINGS: There is tremendous motion  artifact. The examination is limited. Specifically, accuracy in the evaluation of patency in the great vessels is markedly diminished.  Maximal diameter of the ascending aorta at the sinus of Valsalva, sino-tubular junction, and ascending aorta are 3.5 cm, 3.7 cm, and 4.8 cm. Previously, maximal diameter of the ascending aorta was noted to be 4.9 cm. There is no obvious evidence of aortic dissection or intramural hematoma. There is no obvious occlusion of the great vessels. There is no obvious evidence of acute pulmonary thromboembolism.  There is no evidence of mediastinal mass effect. The visualized lungs are grossly clear. The visualized upper abdominal organs are within normal  limits.  IMPRESSION: Maximal diameter of the ascending aorta is not significantly changed at 4.8 cm. There is no evidence of aortic dissection or intramural hematoma. Ascending thoracic aortic aneurysm. Recommend semi-annual imaging followup by CTA or MRA and referral to cardiothoracic surgery if not already obtained. This recommendation follows 2010 ACCF/AHA/AATS/ACR/ASA/SCA/SCAI/SIR/STS/SVM Guidelines for the Diagnosis and Management of Patients With Thoracic Aortic Disease. Circulation. 2010; 121: W620-B559   Electronically Signed   By: Steven [name] M.D.   On: 11/12/2017 09:23 I personally reviewed the MR images and concur with the findings noted above.  Impression: [name] is a 57 year old man with a past medical history significant for tobacco abuse, heart murmur, hyperlipidemia, and reflux who was found to have a 4.9 cm ascending aneurysm on a low-dose screening CT.   Ascending aneurysm-stable at 4.9 cm.  Needs continued semiannual follow-up.  We will plan to do a CT angios at his next visit  Heart murmur-believes he has mitral valve prolapse.  Given that he is having palpitations and has an aneurysm I think it would be worthwhile to get another 2D echo on him to make sure there is no aortic valve pathology.  Palpitations-follow-up with Dr. Damita [name] if condition worsens  Epigastric discomfort-not consistent with angina.  Not related to his aneurysm.  Likely reflux related.  Tobacco abuse-50 pack years.  Quit 10 years ago.  Will do CT angiogram in 6 months which will take the place of his low-dose CT screening.  Plan: 2D echocardiogram  Return in 6 months with CT angiogram of chest  Steven Nakayama, MD [facility] [phone]

## 2017-11-20 ENCOUNTER — Other Ambulatory Visit: Payer: Self-pay | Admitting: *Deleted

## 2017-11-20 DIAGNOSIS — I7121 Aneurysm of the ascending aorta, without rupture: Secondary | ICD-10-CM

## 2017-11-20 DIAGNOSIS — I712 Thoracic aortic aneurysm, without rupture: Secondary | ICD-10-CM

## 2017-11-20 DIAGNOSIS — R011 Cardiac murmur, unspecified: Secondary | ICD-10-CM

## 2017-11-20 NOTE — Progress Notes (Unsigned)
2D Echo

## 2017-11-22 ENCOUNTER — Ambulatory Visit (INDEPENDENT_AMBULATORY_CARE_PROVIDER_SITE_OTHER): Payer: BLUE CROSS/BLUE SHIELD | Admitting: Family Medicine

## 2017-11-22 ENCOUNTER — Encounter: Payer: Self-pay | Admitting: Family Medicine

## 2017-11-22 VITALS — BP 128/90 | HR 85 | Temp 97.5°F | Ht 74.0 in | Wt 250.8 lb

## 2017-11-22 DIAGNOSIS — I712 Thoracic aortic aneurysm, without rupture, unspecified: Secondary | ICD-10-CM

## 2017-11-22 MED ORDER — SILDENAFIL CITRATE 20 MG PO TABS: ORAL_TABLET | ORAL | 99 refills | Status: DC

## 2017-11-22 MED ORDER — ATORVASTATIN CALCIUM 10 MG PO TABS: 5.0000 mg | ORAL_TABLET | Freq: Every day | ORAL | 3 refills | Status: DC

## 2017-11-22 NOTE — Progress Notes (Signed)
HLD.  He has fatigue and leg aches on pravastatin.  He tolerated lipitor more easily, but still had aches with lipitor.   He was irritable on crestor.  We talked about options.  He has vascular changes on CT noted.  No CP.  He has some episodic inc in heart rate.  He has echo pending.   His last T levels were normal and he clearly felt better with replacement and he was not supratherapeutic on current dosing.  He has seen cardiothoracic surgery but has not yet seen cardiology.  Discussed about referral.  I have great faith in the cardiology service, based on their excellent track record of patient care.  Meds, vitals, and allergies reviewed.   ROS: Per HPI unless specifically indicated in ROS section   GEN: nad, alert and oriented HEENT: mucous membranes moist NECK: supple w/o LA CV: rrr.  PULM: ctab, no inc wob ABD: soft, +bs EXT: no edema SKIN: well perfused.

## 2017-11-22 NOTE — Patient Instructions (Signed)
Stop pravastatin for about 2 weeks.  Make sure the aches get better.  Then try 5-10mg  lipitor.   We will call about your referral.  Rosaria Ferries or Azalee Course will call you if you don't see one of them on the way out.  Take care.  Glad to see you.

## 2017-11-24 NOTE — Assessment & Plan Note (Signed)
He has seen vascular surgery.  Refer to cardiology.  Discussed with patient about options regarding lipids.  Reasonable for trial of lipitor with possible PSK9 trial if needed- needs cards input and I'll defer.  He agrees.   Stop pravastatin for now.  He will make sure that his current aches improve.  If so, then he will start Lipitor after that.  See after visit summary.  He agrees with plan.

## 2017-11-26 ENCOUNTER — Ambulatory Visit (HOSPITAL_COMMUNITY): Payer: BLUE CROSS/BLUE SHIELD | Attending: Internal Medicine

## 2017-11-26 ENCOUNTER — Other Ambulatory Visit: Payer: Self-pay

## 2017-11-26 ENCOUNTER — Encounter: Payer: Self-pay | Admitting: Thoracic Surgery (Cardiothoracic Vascular Surgery)

## 2017-11-26 DIAGNOSIS — Z87891 Personal history of nicotine dependence: Secondary | ICD-10-CM | POA: Diagnosis not present

## 2017-11-26 DIAGNOSIS — R011 Cardiac murmur, unspecified: Secondary | ICD-10-CM | POA: Diagnosis not present

## 2017-11-26 DIAGNOSIS — E785 Hyperlipidemia, unspecified: Secondary | ICD-10-CM | POA: Diagnosis not present

## 2017-11-26 DIAGNOSIS — I712 Thoracic aortic aneurysm, without rupture: Secondary | ICD-10-CM | POA: Diagnosis not present

## 2017-11-26 DIAGNOSIS — I340 Nonrheumatic mitral (valve) insufficiency: Secondary | ICD-10-CM | POA: Diagnosis not present

## 2017-11-26 DIAGNOSIS — I7121 Aneurysm of the ascending aorta, without rupture: Secondary | ICD-10-CM

## 2017-11-26 NOTE — Progress Notes (Signed)
Called Mr. Schueler with echo results  Study Conclusions  - Left ventricle: The cavity size was normal. Wall thickness was   increased in a pattern of moderate LVH. Systolic function was   normal. The estimated ejection fraction was in the range of 60%   to 65%. Wall motion was normal; there were no regional wall   motion abnormalities. Doppler parameters are consistent with   abnormal left ventricular relaxation (grade 1 diastolic   dysfunction). The E/e&' ratio is between 8-15, suggesting   indeterminate LV filling pressure. - Aortic valve: Trileaflet. Mild stenosis. There was mild   regurgitation. Mean gradient (S): 13 mm Hg. Peak gradient (S): 25   mm Hg. Valve area (VTI): 1.57 cm^2. - Aorta: Ascending aortic diameter: 48 mm (S). - Ascending aorta: The ascending aorta was moderately dilated. - Mitral valve: Mildly thickened leaflets . There was trivial   regurgitation. - Left atrium: The atrium was normal in size. - Right atrium: The atrium was mildly dilated. - Tricuspid valve: There was no significant regurgitation. - Inferior vena cava: The vessel was normal in size. The   respirophasic diameter changes were in the normal range (= 50%),   consistent with normal central venous pressure.  Impressions:  - LVEF 60-65%, moderate LVH, normal wall motion, grade 1 DD,   indeterminate LV filling pressure, mild aortic stenosis with mild   AI, trivial MR, dilated ascending aorta to 4.8 cm, normal LA   size, mild RAE, normal IVC.  Mild AS/AI, normal LV function  Follow up in 6 months as scheduled  Remo Lipps C. Roxan Hockey, MD [facility] [phone]

## 2017-12-12 DIAGNOSIS — H524 Presbyopia: Secondary | ICD-10-CM | POA: Diagnosis not present

## 2017-12-12 DIAGNOSIS — H2513 Age-related nuclear cataract, bilateral: Secondary | ICD-10-CM | POA: Diagnosis not present

## 2017-12-27 ENCOUNTER — Ambulatory Visit (INDEPENDENT_AMBULATORY_CARE_PROVIDER_SITE_OTHER): Payer: BLUE CROSS/BLUE SHIELD | Admitting: Internal Medicine

## 2017-12-27 ENCOUNTER — Encounter: Payer: Self-pay | Admitting: Internal Medicine

## 2017-12-27 VITALS — BP 122/74 | HR 95 | Temp 98.3°F | Ht 74.0 in | Wt 248.0 lb

## 2017-12-27 DIAGNOSIS — J029 Acute pharyngitis, unspecified: Secondary | ICD-10-CM | POA: Diagnosis not present

## 2017-12-27 LAB — POCT RAPID STREP A (OFFICE): Rapid Strep A Screen: NEGATIVE

## 2017-12-27 NOTE — Assessment & Plan Note (Signed)
Some potential strep exposure but unlikely based on history and exam Rapid test negative Discussed supportive care for viral infection Recommended nasal steroid for persistent sweling

## 2017-12-27 NOTE — Progress Notes (Signed)
Subjective:    Patient ID: Steven [name], male    DOB: 1960-07-11, 57 y.o.   MRN: [id number]  HPI Here due to sore throat  Started 3 days ago Now worse---all the time Sinuses seem to be clear-----similar rinses like Neti pot Some drainage Wife thinks he had fever last night  No rash No SOB No ear pain  Using tylenol and sudafed grandkids were over--- strep a couple of weeks ago (and treated)  Current Outpatient Medications on File Prior to Visit  Medication Sig Dispense Refill  . acetaminophen (TYLENOL) 500 MG tablet Take 500 mg by mouth every 6 (six) hours as needed.    Marland Kitchen aspirin 81 MG tablet Take 81 mg by mouth daily.      Marland Kitchen atorvastatin (LIPITOR) 10 MG tablet Take 0.5-1 tablets (5-10 mg total) by mouth daily. 90 tablet 3  . sildenafil (REVATIO) 20 MG tablet TAKE 3-5 TABLETS BY MOUTH DAILY AS NEEDED 50 tablet prn  . testosterone cypionate (DEPOTESTOSTERONE CYPIONATE) 200 MG/ML injection Inject 1.6 mLs (320 mg total) into the muscle every 14 (fourteen) days. 10 mL 0   No current facility-administered medications on file prior to visit.     Allergies  Allergen Reactions  . Crestor [Rosuvastatin Calcium] Other (See Comments)    Irritable.    . Lipitor [Atorvastatin] Other (See Comments)    Aches  . Naproxen Sodium Swelling  . Penicillins Swelling  . Amoxicillin Rash  . Erythromycin Rash    Past Medical History:  Diagnosis Date  . Aneurysm of aorta ([place]) 03/2017  . At risk for sleep apnea    STOP-BANG= 4     SENT TO PCP 04-15-2014  . ED (erectile dysfunction) of non-organic origin   . Epididymal cyst    LEFT  . GERD (gastroesophageal reflux disease)   . Heart murmur mild mvp  -- asymptomatic   per pt echo 2005  . History of adenomatous polyp of colon   . 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 Hyperlipidemia   . Hypogonadism male    Previously evaluated by Dr. Reece Agar with URO for benign noduarity in left hemiscrotum (2008)  felt to be variant of normal.  . Left nephrolithiasis   . Left ureteral calculus     Past Surgical History:  Procedure Laterality Date  . COLONOSCOPY WITH PROPOFOL  01-06-2014   POLYPECTOMY  . CYSTOSCOPY WITH RETROGRADE PYELOGRAM, URETEROSCOPY AND STENT PLACEMENT Left 04/19/2014   Procedure: CYSTOSCOPY WITH RETROGRADE PYELOGRAM with interpretation, URETEROSCOPY, stone extraction with basket, AND STENT PLACEMENT, implantation of backstop;  Surgeon: Ailene Rud, MD;  Location: [facility];  Service: Urology;  Laterality: Left;  . EXCISION SEBACEOUS CYST X3 OF BACK  03-13-2010  . HOLMIUM LASER APPLICATION Left 41/96/2229   Procedure: HOLMIUM LASER APPLICATION;  Surgeon: Ailene Rud, MD;  Location: [facility];  Service: Urology;  Laterality: Left;  . NASAL SEPTUM SURGERY  09-21-2002  . REFRACTIVE SURGERY  1994    Family History  Problem Relation Age of Onset  . Cancer Mother        Renal CA, treated 1993  . Parkinsonism Father   . Alzheimer's disease Father   . Diabetes Brother   . Diabetes Other   . Kidney disease Other   . Colon cancer Neg Hx   . Prostate cancer Neg Hx   . Esophageal cancer Neg Hx   .  Rectal cancer Neg Hx   . Stomach cancer Neg Hx   . Pancreatic cancer Neg Hx     Social History   Socioeconomic History  . Marital status: Married    Spouse name: Not on file  . Number of children: 3  . Years of education: Not on file  . Highest education level: Not on file  Occupational History  . Occupation: Engineer, maintenance (IT), works from Producer, television/film/video: [place]: [employer] [place], [place], [employer]  Social Needs  . Financial resource strain: Not on file  . Food insecurity:    Worry: Not on file    Inability: Not on file  . Transportation needs:    Medical: Not on file    Non-medical: Not on file  Tobacco Use  . Smoking status: Former Smoker    Packs/day: 1.50    Years: 34.00    Pack years: 51.00    Types:  Cigarettes    Last attempt to quit: 10/29/2008    Years since quitting: 9.1  . Smokeless tobacco: Never Used  Substance and Sexual Activity  . Alcohol use: Yes    Alcohol/week: 0.0 oz    Comment: Rare  . Drug use: No  . Sexual activity: Not on file  Lifestyle  . Physical activity:    Days per week: Not on file    Minutes per session: Not on file  . Stress: Not on file  Relationships  . Social connections:    Talks on phone: Not on file    Gets together: Not on file    Attends religious service: Not on file    Active member of club or organization: Not on file    Attends meetings of clubs or organizations: Not on file    Relationship status: Not on file  . Intimate partner violence:    Fear of current or ex partner: Not on file    Emotionally abused: Not on file    Physically abused: Not on file    Forced sexual activity: Not on file  Other Topics Concern  . Not on file  Social History Narrative   Married since 1988, second marriage   3 children   1 daughter alive -POTTS - monitors salt intake.     2 step-daughters (one diagnosed with Crohn's Disease.)   Regular exercise:  Yes   Review of Systems No trouble swallowing  Eating okay No rash    Objective:   Physical Exam  Constitutional: He appears well-developed. No distress.  HENT:  No sinus tenderness TMs normal Moderate pale nasal congestion Mild redness in pharynx--no tonsillar enlargement or exudate  Neck: No thyromegaly present.  Respiratory: Effort normal and breath sounds normal. No respiratory distress. He has no wheezes. He has no rales.  Lymphadenopathy:    He has no cervical adenopathy.  Skin: No rash noted.           Assessment & Plan:

## 2018-01-06 ENCOUNTER — Other Ambulatory Visit: Payer: Self-pay | Admitting: Family Medicine

## 2018-01-06 NOTE — Telephone Encounter (Signed)
Electronic refill request. Depotestosterone Last office visit:   12/27/17 Acute [place] Last Filled:    10 mL 0 10/27/2017  Please advise.

## 2018-01-07 ENCOUNTER — Encounter: Payer: Self-pay | Admitting: *Deleted

## 2018-01-07 NOTE — Telephone Encounter (Signed)
Sent. Thanks.  Due for CPE this fall.

## 2018-01-07 NOTE — Telephone Encounter (Signed)
Letter mailed

## 2018-02-12 ENCOUNTER — Encounter: Payer: Self-pay | Admitting: Interventional Cardiology

## 2018-02-12 ENCOUNTER — Ambulatory Visit (INDEPENDENT_AMBULATORY_CARE_PROVIDER_SITE_OTHER): Payer: BLUE CROSS/BLUE SHIELD | Admitting: Interventional Cardiology

## 2018-02-12 VITALS — BP 128/80 | HR 75 | Ht 74.0 in | Wt 245.1 lb

## 2018-02-12 DIAGNOSIS — E782 Mixed hyperlipidemia: Secondary | ICD-10-CM | POA: Diagnosis not present

## 2018-02-12 DIAGNOSIS — R1012 Left upper quadrant pain: Secondary | ICD-10-CM | POA: Diagnosis not present

## 2018-02-12 DIAGNOSIS — I517 Cardiomegaly: Secondary | ICD-10-CM | POA: Diagnosis not present

## 2018-02-12 DIAGNOSIS — I712 Thoracic aortic aneurysm, without rupture, unspecified: Secondary | ICD-10-CM

## 2018-02-12 NOTE — Progress Notes (Signed)
Cardiology Office Note   Date:  02/12/2018   ID:  Steven [name], DOB February 25, 1961, MRN [id number]  PCP:  Tonia Ghent, MD    No chief complaint on file.  Thoracic aneurysm/ hyperlipidemia  Wt Readings from Last 3 Encounters:  02/12/18 245 lb 1.9 oz (111.2 kg)  12/27/17 248 lb (112.5 kg)  11/22/17 250 lb 12.8 oz (113.8 kg)       History of Present Illness: Steven [name] is a 57 y.o. male who is being seen today for the evaluation of thoracic aneurysm at the request of Tonia Ghent, MD.  He has been followed by Dr. Roxan Hockey for the aneurysm since 10/18, found incidentally on a CT scan for lung cancer prevention.   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   Denies : Chest pain. Dizziness. Leg edema. Nitroglycerin use. Orthopnea. Palpitations. Paroxysmal nocturnal dyspnea. Shortness of breath. Syncope.    He exercises regularly.  He goes to the gym several days a week and does mostly cardio.  He has been counseled to avoid heavy lifting and straining.  He has been counseled by Dr. Roxan Hockey about watching for any chest discomfort or tearing pain in his chest and to immediately seek attention if this were to occur.  He does have routine imaging planned for nodules and his aneurysm.  Over the past week, he has had some upper abdominal pain.  It occurred after he did a lot of yard work.    Past Medical History:  Diagnosis Date  . Aneurysm of aorta ([place]) 03/2017  . At risk for sleep apnea    STOP-BANG= 4     SENT TO PCP 04-15-2014  . BRBPR (bright red blood per rectum) 07/14/2015  . ED (erectile dysfunction) 03/10/2013  . ED (erectile dysfunction) of non-organic origin   . Epididymal cyst    LEFT  . Epigastric pain 03/07/2017  . GERD (gastroesophageal reflux disease)   . Heart murmur mild mvp  -- asymptomatic   per pt echo 2005  . HEART MURMUR, HX OF 02/13/2010   Qualifier: Diagnosis of  By: Fuller Plan CMA ([place]), [name]    .  History of adenomatous polyp of colon   . 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 HLD (hyperlipidemia) 02/13/2010   Qualifier: Diagnosis of  By: Fuller Plan CMA (AAMA), [name]    . Hx of adenomatous colonic polyps 04/01/2017  . Hyperglycemia 03/10/2013  . Hyperlipidemia   . Hypogonadism in male 02/13/2010   Qualifier: Diagnosis of  By: Damita Dunnings MD, Phillip Heal    . Hypogonadism male    Previously evaluated by Dr. Reece Agar with [facility] for benign noduarity in left hemiscrotum (2008) felt to be variant of normal.  . Left nephrolithiasis   . Left ureteral calculus   . TB SKIN TEST, POSITIVE 02/13/2010   Qualifier: Diagnosis of  By: Fuller Plan CMA (AAMA), [name]    . Testicular pain 12/01/2010   Per Dr. Gaynelle Arabian, thought to be due to epididymal cysts   . Thoracic aortic aneurysm ([place]) 04/18/2017  . 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 COLONOSCOPY WITH PROPOFOL  01-06-2014   POLYPECTOMY  . CYSTOSCOPY WITH RETROGRADE PYELOGRAM, URETEROSCOPY AND STENT PLACEMENT Left 04/19/2014   Procedure: CYSTOSCOPY WITH RETROGRADE PYELOGRAM with interpretation, URETEROSCOPY, stone extraction  with basket, AND STENT PLACEMENT, implantation of backstop;  Surgeon: Ailene Rud, MD;  Location: [facility];  Service: Urology;  Laterality: Left;  . EXCISION SEBACEOUS CYST X3 OF BACK  03-13-2010  . HOLMIUM LASER APPLICATION Left 97/67/3419   Procedure: HOLMIUM LASER APPLICATION;  Surgeon: Ailene Rud, MD;  Location: [facility];  Service: Urology;  Laterality: Left;  . NASAL SEPTUM SURGERY  09-21-2002  . REFRACTIVE SURGERY  1994     Current Outpatient Medications  Medication Sig Dispense Refill  . acetaminophen (TYLENOL) 500 MG tablet Take 500 mg by mouth every 6 (six) hours as needed.    Marland Kitchen aspirin 81 MG tablet Take 81 mg by mouth daily.      Marland Kitchen atorvastatin  (LIPITOR) 10 MG tablet Take 0.5-1 tablets (5-10 mg total) by mouth daily. 90 tablet 3  . sildenafil (REVATIO) 20 MG tablet TAKE 3-5 TABLETS BY MOUTH DAILY AS NEEDED 50 tablet prn  . testosterone cypionate (DEPOTESTOSTERONE CYPIONATE) 200 MG/ML injection INJECT 1.6 MLS (320 MG TOTAL) INTO THE MUSCLE EVERY 14 (FOURTEEN) DAYS. 10 mL 0   No current facility-administered medications for this visit.     Allergies:   Crestor [rosuvastatin calcium]; Lipitor [atorvastatin]; Naproxen sodium; Penicillins; Amoxicillin; and Erythromycin    Social History:  The patient  reports that he quit smoking about 9 years ago. His smoking use included cigarettes. He has a 51.00 pack-year smoking history. He has never used smokeless tobacco. He reports that he drinks alcohol. He reports that he does not use drugs.   Family History:  The patient's family history includes Alzheimer's disease in his father; Cancer in his mother; Diabetes in his brother and other; Kidney disease in his other; Parkinsonism in his father.    ROS:  Please see the history of present illness.   Otherwise, review of systems are positive for upper abdominal soreness.   All other systems are reviewed and negative.    PHYSICAL EXAM: VS:  BP 128/80 (BP Location: Right Arm, Patient Position: Sitting, Cuff Size: Large)   Pulse 75   Ht 6\' 2"  (1.88 m)   Wt 245 lb 1.9 oz (111.2 kg)   SpO2 95%   BMI 31.47 kg/m  , BMI Body mass index is 31.47 kg/m. GEN: Well nourished, well developed, in no acute distress  HEENT: normal  Neck: no JVD, carotid bruits, or masses Cardiac: RRR; no murmurs, rubs, or gallops,no edema  Respiratory:  clear to auscultation bilaterally, normal work of breathing GI: soft, nontender, nondistended, + BS; small area of tenderness in the left upper quadrant.  Worse with palpation.  No rebound, no guarding MS: no deformity or atrophy  Skin: warm and dry, no rash Neuro:  Strength and sensation are intact Psych: euthymic mood,  full affect   EKG:   The ekg ordered today demonstrates normal sinus rhythm, incomplete right bundle branch block, nonspecific ST T wave changes in the inferior lateral leads.   Recent Labs: 03/06/2017: BUN 12; Creatinine, Ser 1.01; Hemoglobin 17.3; Platelets 226.0; Potassium 4.0; Sodium 140 06/24/2017: ALT 37   Lipid Panel    Component Value Date/Time   CHOL 188 06/24/2017 0754   TRIG 180.0 (H) 06/24/2017 0754   HDL 37.70 (L) 06/24/2017 0754   CHOLHDL 5 06/24/2017 0754   VLDL 36.0 06/24/2017 0754   LDLCALC 114 (H) 06/24/2017 0754   LDLDIRECT 162.0 03/06/2017 1719     Other studies Reviewed: Additional studies/ records that were reviewed today with results  demonstrating: CT scans reviewed..   ASSESSMENT AND PLAN:  1. Thoracic aortic aneurysm: 4.9 cm.  Routine imaging follow-up through cardiac surgeons office.  I again reminded him to not do any heavy lifting or bearing down.  Cardiovascular type exercise would be fine.  We also discussed the fact that if he needed surgery on his aneurysm, he would need a cardiac cath preoperatively. 2. Hyperlipidemia: He has had issues with statin intolerance.  LDL has come down significantly on his current dose.  Continue current dose of Lipitor. 3. LVH: I think this likely explains the changes noted on his ECG.  He does not have a diagnosis of hypertension but mentions that his mother also has a similar condition.  She is on verapamil. 4. I think his upper abdominal pain is more likely muscular in nature.  It does not appear to be cardiac. 5. MVP not seen on recent echo.    Current medicines are reviewed at length with the patient today.  The patient concerns regarding his medicines were addressed.  The following changes have been made:  No change  Labs/ tests ordered today include:  No orders of the defined types were placed in this encounter.   Recommend 150 minutes/week of aerobic exercise Low fat, low carb, high fiber diet  recommended  Disposition:   FU in 1 year   Signed, Larae Grooms, MD  02/12/2018 10:12 AM    [address], [place],   [address] Phone: [phone]; Fax: [phone]

## 2018-02-12 NOTE — Patient Instructions (Signed)

## 2018-03-25 ENCOUNTER — Other Ambulatory Visit: Payer: Self-pay | Admitting: *Deleted

## 2018-03-25 DIAGNOSIS — I712 Thoracic aortic aneurysm, without rupture, unspecified: Secondary | ICD-10-CM

## 2018-04-10 ENCOUNTER — Telehealth: Payer: Self-pay | Admitting: Radiology

## 2018-04-10 ENCOUNTER — Encounter: Payer: Self-pay | Admitting: Family Medicine

## 2018-04-10 ENCOUNTER — Ambulatory Visit (INDEPENDENT_AMBULATORY_CARE_PROVIDER_SITE_OTHER): Payer: BLUE CROSS/BLUE SHIELD | Admitting: Family Medicine

## 2018-04-10 VITALS — BP 130/80 | HR 82 | Temp 97.7°F | Ht 72.5 in | Wt 239.1 lb

## 2018-04-10 DIAGNOSIS — R0683 Snoring: Secondary | ICD-10-CM

## 2018-04-10 DIAGNOSIS — E291 Testicular hypofunction: Secondary | ICD-10-CM | POA: Diagnosis not present

## 2018-04-10 DIAGNOSIS — I712 Thoracic aortic aneurysm, without rupture, unspecified: Secondary | ICD-10-CM

## 2018-04-10 DIAGNOSIS — E785 Hyperlipidemia, unspecified: Secondary | ICD-10-CM

## 2018-04-10 DIAGNOSIS — Z7189 Other specified counseling: Secondary | ICD-10-CM

## 2018-04-10 DIAGNOSIS — Z0000 Encounter for general adult medical examination without abnormal findings: Secondary | ICD-10-CM | POA: Diagnosis not present

## 2018-04-10 DIAGNOSIS — Z23 Encounter for immunization: Secondary | ICD-10-CM

## 2018-04-10 LAB — COMPREHENSIVE METABOLIC PANEL
ALBUMIN: 4.9 g/dL (ref 3.5–5.2)
ALT: 59 U/L — ABNORMAL HIGH (ref 0–53)
AST: 62 U/L — ABNORMAL HIGH (ref 0–37)
Alkaline Phosphatase: 95 U/L (ref 39–117)
BUN: 10 mg/dL (ref 6–23)
CHLORIDE: 100 meq/L (ref 96–112)
CO2: 29 mEq/L (ref 19–32)
Calcium: 10.2 mg/dL (ref 8.4–10.5)
Creatinine, Ser: 0.96 mg/dL (ref 0.40–1.50)
GFR: 85.28 mL/min (ref >60.00)
Glucose, Bld: 91 mg/dL (ref 70–99)
POTASSIUM: 4.2 meq/L (ref 3.5–5.1)
Sodium: 138 mEq/L (ref 135–145)
TOTAL PROTEIN: 7.7 g/dL (ref 6.0–8.3)
Total Bilirubin: 1 mg/dL (ref 0.2–1.2)

## 2018-04-10 LAB — LIPID PANEL
CHOL/HDL RATIO: 5
Cholesterol: 178 mg/dL (ref 0–200)
HDL: 38.1 mg/dL — AB (ref >39.00)
LDL CALC: 106 mg/dL — AB (ref 0–99)
NonHDL: 139.83
TRIGLYCERIDES: 170 mg/dL — AB (ref 0.0–149.0)
VLDL: 34 mg/dL (ref 0.0–40.0)

## 2018-04-10 LAB — CBC WITH DIFFERENTIAL/PLATELET
BASOS PCT: 1.2 % (ref 0.0–3.0)
Basophils Absolute: 0.1 10*3/uL (ref 0.0–0.1)
EOS PCT: 2.8 % (ref 0.0–5.0)
Eosinophils Absolute: 0.2 10*3/uL (ref 0.0–0.7)
HEMATOCRIT: 53.7 % — AB (ref 39.0–52.0)
Lymphocytes Relative: 34.4 % (ref 12.0–46.0)
Lymphs Abs: 2.9 10*3/uL (ref 0.7–4.0)
MCHC: 33.5 g/dL (ref 30.0–36.0)
MCV: 93.4 fl (ref 78.0–100.0)
MONOS PCT: 6.4 % (ref 3.0–12.0)
Monocytes Absolute: 0.5 10*3/uL (ref 0.1–1.0)
Neutro Abs: 4.7 10*3/uL (ref 1.4–7.7)
Neutrophils Relative %: 55.2 % (ref 43.0–77.0)
Platelets: 214 10*3/uL (ref 150.0–400.0)
RBC: 5.75 Mil/uL (ref 4.22–5.81)
RDW: 13.8 % (ref 11.5–15.5)
WBC: 8.6 10*3/uL (ref 4.0–10.5)

## 2018-04-10 LAB — TESTOSTERONE: Testosterone: 1321.61 ng/dL — ABNORMAL HIGH (ref 300.00–890.00)

## 2018-04-10 LAB — PSA: PSA: 0.48 ng/mL (ref 0.10–4.00)

## 2018-04-10 NOTE — Patient Instructions (Addendum)
Ask vascular clinic about the follow up imaging/CT.   Go to the lab on the way out.  We'll contact you with your lab report. Stop the lipitor for about 1 week and see if the aches clearly get better.  Restart as tolerated.  Take care.  Glad to see you.  Thanks for your effort.

## 2018-04-10 NOTE — Telephone Encounter (Signed)
[facility] lab called a critical HGB - 18.0, results given to Dr Damita Dunnings.

## 2018-04-10 NOTE — Progress Notes (Signed)
CPE- See plan.  Routine anticipatory guidance given to patient.  See health maintenance.  The possibility exists that previously documented standard health maintenance information may have been brought forward from a previous encounter into this note.  If needed, that same information has been updated to reflect the current situation based on today's encounter.    Tetanus 2015 Flu shot 2019 PNA not due.  Shingles d/w pt. Due at 38.  PSA pending.   Colonoscopy 2015 Living will d/w pt. Prev done. If incapacitated, would have his wife speak for him.  Diet and exercise d/w pt. Encouraged both.  HCV and HIV prev neg. D/w pt.  Smoked for 35 years.  D/w pt about lung cancer screening. Referred prev.   No chest pain.  He has vascular f/u pending.    Intentional weight loss noted.   Elevated Cholesterol: Using medications without problems: some aches and he tapers dose as needed.   Muscle aches:  Some, see above. Diet compliance: yes Exercise:yes He takes on average 7.5mg  a day of atorvastatin.  Low testosterone prev, on replacement.   No ADE on replacement.  He can tell a drop off right before the next injection, with fatigue especially if he misses a dose.  Risk and benefit d/w pt.  No contraindication currently.  Labs d/w pt.    Snoring is clearly better with weight loss.   PMH and SH reviewed  Meds, vitals, and allergies reviewed.   ROS: Per HPI.  Unless specifically indicated otherwise in HPI, the patient denies:  General: fever. Eyes: acute vision changes ENT: sore throat Cardiovascular: chest pain Respiratory: SOB GI: vomiting GU: dysuria Musculoskeletal: acute back pain Derm: acute rash Neuro: acute motor dysfunction Psych: worsening mood Endocrine: polydipsia Heme: bleeding Allergy: hayfever  GEN: nad, alert and oriented HEENT: mucous membranes moist NECK: supple w/o LA CV: rrr. PULM: ctab, no inc wob ABD: soft, +bs EXT: no edema SKIN: no acute  rash

## 2018-04-11 ENCOUNTER — Other Ambulatory Visit: Payer: Self-pay | Admitting: Family Medicine

## 2018-04-11 DIAGNOSIS — E291 Testicular hypofunction: Secondary | ICD-10-CM

## 2018-04-11 NOTE — Assessment & Plan Note (Signed)
See notes on labs.  Hold statin for now.  He has been trying to titrate his dose, when he has muscle aches he cut back on statin.  He will see if the aches get dramatically better with statin cessation.

## 2018-04-11 NOTE — Assessment & Plan Note (Signed)
History of, he will follow-up with vascular surgery clinic.  He should be able to get routine monitoring for this at the same time he has pulmonary nodule follow-up.

## 2018-04-11 NOTE — Assessment & Plan Note (Signed)
We talked about risk versus benefit of replacement.  He felt better with less fatigue on treatment.  Due for routine follow-up labs.  See notes on labs.

## 2018-04-11 NOTE — Assessment & Plan Note (Addendum)
Tetanus 2015 Flu shot 2019 PNA not due.  Shingles d/w pt. Due at 61.  PSA pending.   Colonoscopy 2015 Living will d/w pt. Prev done. If incapacitated, would have his wife speak for him.  Diet and exercise d/w pt. Encouraged both.  HCV and HIV prev neg. D/w pt.  Smoked for 35 years.  D/w pt about lung cancer screening. Referred prev.

## 2018-04-11 NOTE — Assessment & Plan Note (Signed)
Living will d/w pt. Prev done. If incapacitated, would have his wife speak for him.  

## 2018-04-11 NOTE — Telephone Encounter (Signed)
See lab result note.  Thanks.

## 2018-04-11 NOTE — Assessment & Plan Note (Signed)
Improved with weight loss 

## 2018-04-24 ENCOUNTER — Other Ambulatory Visit (INDEPENDENT_AMBULATORY_CARE_PROVIDER_SITE_OTHER): Payer: BLUE CROSS/BLUE SHIELD

## 2018-04-24 DIAGNOSIS — E291 Testicular hypofunction: Secondary | ICD-10-CM

## 2018-04-24 LAB — CBC WITH DIFFERENTIAL/PLATELET
Basophils Absolute: 0.1 K/uL (ref 0.0–0.1)
Basophils Relative: 0.8 % (ref 0.0–3.0)
Eosinophils Absolute: 0.4 K/uL (ref 0.0–0.7)
Eosinophils Relative: 5.2 % — ABNORMAL HIGH (ref 0.0–5.0)
HCT: 52.3 % — ABNORMAL HIGH (ref 39.0–52.0)
Hemoglobin: 17.8 g/dL — ABNORMAL HIGH (ref 13.0–17.0)
Lymphocytes Relative: 40.4 % (ref 12.0–46.0)
Lymphs Abs: 2.8 K/uL (ref 0.7–4.0)
MCHC: 34 g/dL (ref 30.0–36.0)
MCV: 93.1 fl (ref 78.0–100.0)
Monocytes Absolute: 0.7 K/uL (ref 0.1–1.0)
Monocytes Relative: 9.6 % (ref 3.0–12.0)
Neutro Abs: 3.1 K/uL (ref 1.4–7.7)
Neutrophils Relative %: 44 % (ref 43.0–77.0)
Platelets: 200 K/uL (ref 150.0–400.0)
RBC: 5.61 Mil/uL (ref 4.22–5.81)
RDW: 13.5 % (ref 11.5–15.5)
WBC: 7 K/uL (ref 4.0–10.5)

## 2018-04-24 LAB — COMPREHENSIVE METABOLIC PANEL
ALT: 33 U/L (ref 0–53)
AST: 28 U/L (ref 0–37)
Albumin: 4.8 g/dL (ref 3.5–5.2)
Alkaline Phosphatase: 88 U/L (ref 39–117)
BUN: 13 mg/dL (ref 6–23)
CHLORIDE: 103 meq/L (ref 96–112)
CO2: 28 meq/L (ref 19–32)
CREATININE: 1.02 mg/dL (ref 0.40–1.50)
Calcium: 9.9 mg/dL (ref 8.4–10.5)
GFR: 79.51 mL/min (ref >60.00)
GLUCOSE: 106 mg/dL — AB (ref 70–99)
Potassium: 4.2 mEq/L (ref 3.5–5.1)
SODIUM: 137 meq/L (ref 135–145)
Total Bilirubin: 1 mg/dL (ref 0.2–1.2)
Total Protein: 7.7 g/dL (ref 6.0–8.3)

## 2018-04-24 LAB — TESTOSTERONE: Testosterone: 69.76 ng/dL — ABNORMAL LOW (ref 300.00–890.00)

## 2018-04-28 ENCOUNTER — Telehealth: Payer: Self-pay | Admitting: *Deleted

## 2018-04-28 ENCOUNTER — Encounter: Payer: Self-pay | Admitting: Family Medicine

## 2018-04-28 DIAGNOSIS — R7989 Other specified abnormal findings of blood chemistry: Secondary | ICD-10-CM

## 2018-04-28 NOTE — Telephone Encounter (Signed)
-----   Message from Tonia Ghent, MD sent at 04/28/2018 12:16 AM EST ----- Call patient.  Liver tests have normalized.  Kidney function is still fine.  Sugar was only minimally elevated but I do not think he was fasting. Testosterone level is clearly lower off replacement. Hemoglobin is still elevated but not worse than it was a few weeks ago.  I was expecting that not to normalize yet-it would not have had time to come back to normal.  How does he feel?  Let me know.  If the aches are clearly better off the Lipitor, then he may need to stay off it totally. We can make plans after I get an update from the patient. Thanks.

## 2018-04-30 NOTE — Telephone Encounter (Signed)
Pt returned your call Best number [phone]

## 2018-04-30 NOTE — Telephone Encounter (Signed)
Patient notified as instructed by telephone and verbalized understanding. Patient stated that he is feeling just fine. Patient stated that he has only noticed a slight improvement in aches since stopping the Lipitor.

## 2018-05-02 NOTE — Addendum Note (Signed)
Addended by: Tonia Ghent on: 05/02/2018 10:28 AM   Modules accepted: Orders

## 2018-05-02 NOTE — Telephone Encounter (Signed)
Call pt.  I have been considering his situation in the meantime.  I would stay off the lipitor a little longer and see if he notes any more changes.    I also think we should recheck his CBC prior to considering a restart on his testosterone.  I realize his T level is low.  I am concerned that his HGB had gone up to 18 prev.   I would recheck his CBC in about 2 weeks and go from there.  Have him update me about how he feels/aches/fatigue at that point.  Stay off the testosterone in the meantime.   I put in the order for the CBC.  Thanks.

## 2018-05-02 NOTE — Telephone Encounter (Signed)
Patient advised.  Lab appt scheduled.  

## 2018-05-16 ENCOUNTER — Other Ambulatory Visit (INDEPENDENT_AMBULATORY_CARE_PROVIDER_SITE_OTHER): Payer: BLUE CROSS/BLUE SHIELD

## 2018-05-16 DIAGNOSIS — R7989 Other specified abnormal findings of blood chemistry: Secondary | ICD-10-CM | POA: Diagnosis not present

## 2018-05-16 LAB — CBC WITH DIFFERENTIAL/PLATELET
BASOS PCT: 1.1 % (ref 0.0–3.0)
Basophils Absolute: 0.1 10*3/uL (ref 0.0–0.1)
EOS ABS: 0.4 10*3/uL (ref 0.0–0.7)
EOS PCT: 5.8 % — AB (ref 0.0–5.0)
HCT: 48.6 % (ref 39.0–52.0)
Hemoglobin: 16.6 g/dL (ref 13.0–17.0)
LYMPHS ABS: 2.7 10*3/uL (ref 0.7–4.0)
Lymphocytes Relative: 44.1 % (ref 12.0–46.0)
MCHC: 34.2 g/dL (ref 30.0–36.0)
MCV: 91.5 fl (ref 78.0–100.0)
Monocytes Absolute: 0.5 10*3/uL (ref 0.1–1.0)
Monocytes Relative: 7.6 % (ref 3.0–12.0)
NEUTROS ABS: 2.6 10*3/uL (ref 1.4–7.7)
NEUTROS PCT: 41.4 % — AB (ref 43.0–77.0)
PLATELETS: 187 10*3/uL (ref 150.0–400.0)
RBC: 5.32 Mil/uL (ref 4.22–5.81)
RDW: 13.3 % (ref 11.5–15.5)
WBC: 6.2 10*3/uL (ref 4.0–10.5)

## 2018-05-20 ENCOUNTER — Ambulatory Visit
Admission: RE | Admit: 2018-05-20 | Discharge: 2018-05-20 | Disposition: A | Discharge status: Home / Self Care | Payer: BLUE CROSS/BLUE SHIELD | Source: Ambulatory Visit | Attending: Thoracic Surgery (Cardiothoracic Vascular Surgery) | Admitting: Thoracic Surgery (Cardiothoracic Vascular Surgery)

## 2018-05-20 ENCOUNTER — Ambulatory Visit (INDEPENDENT_AMBULATORY_CARE_PROVIDER_SITE_OTHER): Payer: BLUE CROSS/BLUE SHIELD | Admitting: Thoracic Surgery (Cardiothoracic Vascular Surgery)

## 2018-05-20 VITALS — BP 114/69 | HR 78 | Resp 20 | Ht 77.0 in | Wt 239.0 lb

## 2018-05-20 DIAGNOSIS — R011 Cardiac murmur, unspecified: Secondary | ICD-10-CM

## 2018-05-20 DIAGNOSIS — I712 Thoracic aortic aneurysm, without rupture, unspecified: Secondary | ICD-10-CM

## 2018-05-20 DIAGNOSIS — I7121 Aneurysm of the ascending aorta, without rupture: Secondary | ICD-10-CM

## 2018-05-20 NOTE — Progress Notes (Signed)
[address]       [place] [address]             [phone]     HPI: Steven [name] returns for follow-up  Steven [name] is a 57 year old man with a history of tobacco abuse, heart murmur, hyperlipidemia, gas esophageal reflux, positive PPD, and nephrolithiasis.  He had about a 50-pack-year history of smoking before quitting 10 years ago.  He was referred for the low-dose screening program given his smoking history.  He did not have any suspicious lung nodules but did have a 4.9 cm ascending aneurysm.  I last saw him in May 2019.  At that time his aneurysm was stable.  There were no suspicious lung nodules.  A 2D echocardiogram showed mild aortic stenosis and regurgitation.  His mean gradient was 13 mmHg and peak was 25 mmHg.  Left ventricular function was preserved.  In the interim since his last visit he is been doing well.  He does not have any chest pain, pressure, tightness, or shortness of breath.  Past Medical History:  Diagnosis Date  . At risk for sleep apnea    STOP-BANG= 4     SENT TO PCP 04-15-2014  . BRBPR (bright red blood per rectum) 07/14/2015  . ED (erectile dysfunction) 03/10/2013  . Epididymal cyst    LEFT  . GERD (gastroesophageal reflux disease)   . Heart murmur mild mvp  -- asymptomatic   per pt echo 2005  . History of adenomatous polyp of colon   . 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 Hx of adenomatous colonic polyps 04/01/2017  . Hyperglycemia 03/10/2013  . Hyperlipidemia   . Hypogonadism male    Previously evaluated by Dr. Reece [name] with [facility] for benign noduarity in left hemiscrotum (2008) felt to be variant of normal.  . Left nephrolithiasis   . Testicular pain 12/01/2010   Per Dr. Gaynelle [name], thought to be due to epididymal cysts   . Thoracic aortic aneurysm ([facility]) 04/18/2017  . VARICOCELE 02/17/2010   Qualifier: Diagnosis of  By: Steven [name] CMA (AAMA), Steven [name]      Current Outpatient Medications    Medication Sig Dispense Refill  . acetaminophen (TYLENOL) 500 MG tablet Take 500 mg by mouth every 6 (six) hours as needed.    Marland Kitchen aspirin 81 MG tablet Take 81 mg by mouth daily.      Marland Kitchen atorvastatin (LIPITOR) 10 MG tablet Take 0.5-1 tablets (5-10 mg total) by mouth daily. (Patient not taking: Reported on 05/20/2018) 90 tablet 3  . sildenafil (REVATIO) 20 MG tablet TAKE 3-5 TABLETS BY MOUTH DAILY AS NEEDED (Patient not taking: Reported on 05/20/2018) 50 tablet prn  . testosterone cypionate (DEPOTESTOSTERONE CYPIONATE) 200 MG/ML injection INJECT 1.6 MLS (320 MG TOTAL) INTO THE MUSCLE EVERY 14 (FOURTEEN) DAYS. (Patient not taking: Reported on 05/20/2018) 10 mL 0   No current facility-administered medications for this visit.     Physical Exam BP 114/69   Pulse 78   Resp 20   Ht 6\' 5"  (1.956 m)   Wt 239 lb (108.4 kg)   SpO2 98% Comment: RA  BMI 28.26 kg/m  57 year old man in no acute distress Alert and oriented x3 with no focal neurologic deficits No cervical or subclavicular adenopathy.  No carotid bruits Cardiac regular rate and rhythm with a 2/6 systolic murmur heard throughout the precordium Lungs clear with equal breath sounds bilaterally  Pulses 2+ and symmetric No peripheral edema  Diagnostic Tests: CT CHEST WITHOUT CONTRAST  TECHNIQUE: Multidetector CT imaging of the chest was performed following the standard protocol without IV contrast.  COMPARISON:  04/17/2017 chest CT. 11/12/2017 MR angiogram of the chest.  FINDINGS: Cardiovascular: Normal heart size. No significant pericardial effusion/thickening. Left anterior descending coronary atherosclerosis. Atherosclerotic thoracic aorta with stable 4.9 cm ascending thoracic aortic aneurysm. Aortic arch and descending thoracic aorta are normal caliber. No acute intramural hematoma in the thoracic aorta. Normal caliber pulmonary arteries.  Mediastinum/Nodes: No discrete thyroid nodules. Unremarkable esophagus. No  pathologically enlarged axillary, mediastinal or hilar lymph nodes, noting limited sensitivity for the detection of hilar adenopathy on this noncontrast study.  Lungs/Pleura: No pneumothorax. No pleural effusion. Mild centrilobular and paraseptal emphysema with mild diffuse bronchial wall thickening. No acute consolidative airspace disease or lung masses. A few scattered small solid pulmonary nodules in the right middle lobe measuring up to 4 mm (series 8/image 90) are all stable since 04/17/2017 screening chest CT. No new significant pulmonary nodules.  Upper abdomen: Nonobstructing upper left renal 2-3 mm stones.  Musculoskeletal: No aggressive appearing focal osseous lesions. Moderate thoracic spondylosis.  IMPRESSION: 1. Stable 4.9 cm ascending thoracic aortic aneurysm. Ascending thoracic aortic aneurysm. Recommend semi-annual imaging followup by CTA or MRA and referral to cardiothoracic surgery if not already obtained. This recommendation follows 2010 ACCF/AHA/AATS/ACR/ASA/SCA/SCAI/SIR/STS/SVM Guidelines for the Diagnosis and Management of Patients With Thoracic Aortic Disease. Circulation. 2010; 121: Z482-L078. 2. One vessel coronary atherosclerosis. 3. Scattered small solid pulmonary nodules are stable since 04/17/2017 screening chest CT, considered benign. No acute pulmonary disease. 4. Nonobstructing left nephrolithiasis.  Aortic Atherosclerosis (ICD10-I70.0) and Emphysema (ICD10-J43.9).   Electronically Signed   By: Steven [name] M.D.   On: 05/20/2018 11:46   Impression: Steven [name] is a 57 year old gentleman with a long-standing heart murmur who has a history of tobacco abuse.  He does meet criteria for low-dose CT screening for lung cancer.  He first had that done back about a year ago.  He did not have any suspicious lung nodules, but did have a 4.9 cm aneurysm.  Ascending aneurysm-stable at 4.9 cm.  Needs continued semiannual follow-up.  Aortic  stenosis-mild by echo.  Repeat echo in 6 months.  History of tobacco abuse-quit 10 years ago.  Continue to do CT scans at least annually as part of his follow-up.  That will cover his lung cancer screening as well.  [name]: Return in 6 months with MR Angio of chest and 2D echocardiogram  [name], MD [facility] [phone]

## 2018-05-29 ENCOUNTER — Ambulatory Visit (INDEPENDENT_AMBULATORY_CARE_PROVIDER_SITE_OTHER)
Admission: RE | Admit: 2018-05-29 | Discharge: 2018-05-29 | Disposition: A | Discharge status: Home / Self Care | Payer: BLUE CROSS/BLUE SHIELD | Source: Ambulatory Visit | Attending: Family Medicine | Admitting: Family Medicine

## 2018-05-29 ENCOUNTER — Ambulatory Visit (INDEPENDENT_AMBULATORY_CARE_PROVIDER_SITE_OTHER): Payer: BLUE CROSS/BLUE SHIELD | Admitting: Family Medicine

## 2018-05-29 ENCOUNTER — Encounter: Payer: Self-pay | Admitting: Family Medicine

## 2018-05-29 VITALS — BP 130/72 | HR 99 | Temp 98.0°F | Ht 77.0 in | Wt 243.2 lb

## 2018-05-29 DIAGNOSIS — M79602 Pain in left arm: Secondary | ICD-10-CM

## 2018-05-29 DIAGNOSIS — E291 Testicular hypofunction: Secondary | ICD-10-CM

## 2018-05-29 DIAGNOSIS — E785 Hyperlipidemia, unspecified: Secondary | ICD-10-CM

## 2018-05-29 DIAGNOSIS — M47812 Spondylosis without myelopathy or radiculopathy, cervical region: Secondary | ICD-10-CM | POA: Diagnosis not present

## 2018-05-29 MED ORDER — ATORVASTATIN CALCIUM 10 MG PO TABS: 10.0000 mg | ORAL_TABLET | Freq: Every day | ORAL | 3 refills | Status: DC

## 2018-05-29 NOTE — Progress Notes (Signed)
Testosterone replacement.  Off T now.  He was waking more at night and his libido is lower. He isn't fatigued.  We talked about his CBC that had normalized.    HLD.  Off statin now.  He didn't think the aches were better off the lipitor.  Aches may be worse off statin. We can be reasonably sure the statin didn't cause all of the aches.  Discussed.  He still has some aches esp in the L arm.  He has numbness in the L 4th and 5th fingers.  Worse in the last year.  He has some L shoulder pain.  He has pain the ulnar side of the forearm.  No trauma.  No right-sided symptoms.  No similar leg symptoms.  He has some pain laying on his left side at night, with pain near the left shoulder.  Meds, vitals, and allergies reviewed.   ROS: Per HPI unless specifically indicated in ROS section   GEN: nad, alert and oriented HEENT: mucous membranes moist NECK: supple w/o LA CV: rrr.  PULM: ctab, no inc wob ABD: soft, +bs EXT: no edema SKIN: no acute rash Strength and sensation grossly within normal limits for the bilateral upper extremities.  Grip within normal limits bilaterally.  Left shoulder with pain on external rotation improved with scapular manipulation.  No pain with internal rotation.  No arm drop.

## 2018-05-29 NOTE — Patient Instructions (Addendum)
If you want to consider the testosterone replacement, then let me know and we can refer you to urology.   Restart lipitor.   Use the shoulder exercises.   Go to the lab on the way out.  We'll contact you with your xray report.  Update me as needed.  Take care.  Glad to see you.

## 2018-06-01 DIAGNOSIS — M79602 Pain in left arm: Secondary | ICD-10-CM | POA: Insufficient documentation

## 2018-06-01 NOTE — Assessment & Plan Note (Signed)
He may have 2 separate issues here.  He likely has rotator cuff irritation.  Anatomy and home exercise program discussed with patient.  He had pain on left shoulder external rotation that improved with scapular manipulation so a home exercise program may be beneficial to the patient.  He also has tingling in the left fourth and fifth fingers.  He also has some pain on the ulnar side of the forearm.  Tinel negative at the wrist bilaterally.  This could be a radicular source from his neck.  Check plain films today.  See notes on imaging. >25 minutes spent in face to face time with patient, >50% spent in counselling or coordination of care.

## 2018-06-01 NOTE — Assessment & Plan Note (Signed)
Likely that his current aches or not related to his statin.  See above.  Reasonable to restart Lipitor.  He agrees.

## 2018-06-01 NOTE — Assessment & Plan Note (Signed)
His CBC normalized off testosterone.  I would hold off on testosterone replacement at this point.  If he is interested in restarting then we can refer him to urology.  He agrees.  He will update me as needed.

## 2018-09-22 ENCOUNTER — Other Ambulatory Visit: Payer: Self-pay | Admitting: Thoracic Surgery (Cardiothoracic Vascular Surgery)

## 2018-09-22 DIAGNOSIS — I712 Thoracic aortic aneurysm, without rupture, unspecified: Secondary | ICD-10-CM

## 2018-09-22 DIAGNOSIS — I350 Nonrheumatic aortic (valve) stenosis: Secondary | ICD-10-CM

## 2018-10-23 DIAGNOSIS — E291 Testicular hypofunction: Secondary | ICD-10-CM | POA: Diagnosis not present

## 2018-11-21 ENCOUNTER — Other Ambulatory Visit: Payer: Self-pay | Admitting: Family Medicine

## 2018-11-21 NOTE — Telephone Encounter (Signed)
Electronic refill request Lipitor Refill request does not match the medication list which shows one daily See allergy/contraindication. Please confirm directions

## 2018-11-23 NOTE — Telephone Encounter (Signed)
Sent. Thanks.   

## 2018-12-02 ENCOUNTER — Telehealth (HOSPITAL_COMMUNITY): Payer: Self-pay

## 2018-12-02 NOTE — Telephone Encounter (Signed)
LMTCB COVID prescreening. Left echo appt details per DPR.

## 2018-12-03 ENCOUNTER — Ambulatory Visit (HOSPITAL_COMMUNITY): Payer: BC Managed Care – PPO | Attending: Cardiology

## 2018-12-03 ENCOUNTER — Other Ambulatory Visit: Payer: Self-pay

## 2018-12-03 ENCOUNTER — Telehealth (HOSPITAL_COMMUNITY): Payer: Self-pay | Admitting: Radiology

## 2018-12-03 DIAGNOSIS — I350 Nonrheumatic aortic (valve) stenosis: Secondary | ICD-10-CM | POA: Insufficient documentation

## 2018-12-03 NOTE — Telephone Encounter (Signed)

## 2018-12-04 ENCOUNTER — Ambulatory Visit
Admission: RE | Admit: 2018-12-04 | Discharge: 2018-12-04 | Disposition: A | Discharge status: Home / Self Care | Payer: BLUE CROSS/BLUE SHIELD | Source: Ambulatory Visit | Attending: Thoracic Surgery (Cardiothoracic Vascular Surgery) | Admitting: Thoracic Surgery (Cardiothoracic Vascular Surgery)

## 2018-12-04 DIAGNOSIS — I712 Thoracic aortic aneurysm, without rupture, unspecified: Secondary | ICD-10-CM

## 2018-12-04 MED ORDER — GADOBENATE DIMEGLUMINE 529 MG/ML IV SOLN
20.0000 mL | Freq: Once | INTRAVENOUS | Status: AC | PRN
  Administered 2018-12-04: 20 mL via INTRAVENOUS

## 2018-12-08 ENCOUNTER — Other Ambulatory Visit: Payer: Self-pay

## 2018-12-09 ENCOUNTER — Ambulatory Visit: Payer: BC Managed Care – PPO | Admitting: Thoracic Surgery (Cardiothoracic Vascular Surgery)

## 2018-12-09 ENCOUNTER — Encounter: Payer: Self-pay | Admitting: Thoracic Surgery (Cardiothoracic Vascular Surgery)

## 2018-12-09 VITALS — BP 136/76 | HR 75 | Temp 97.7°F | Resp 20 | Ht 77.0 in | Wt 245.0 lb

## 2018-12-09 DIAGNOSIS — I7121 Aneurysm of the ascending aorta, without rupture: Secondary | ICD-10-CM

## 2018-12-09 DIAGNOSIS — I712 Thoracic aortic aneurysm, without rupture: Secondary | ICD-10-CM

## 2018-12-09 NOTE — Progress Notes (Signed)
[address]       [place] [address]             [phone]     HPI: Steven [name] returns for a scheduled six-month follow-up visit  Steven [name] is a 58 year old man with a history of tobacco abuse, heart murmur, hyperlipidemia, gastroesophageal reflux, nephrolithiasis, and a positive PPD.  He had a 50-pack-year history of smoking before quitting 10 years ago.  He had a low-dose CT for lung cancer screening.  He was found to have a 4.9 cm ascending aneurysm.  2D echocardiogram showed mild AS and mild AI.  I last saw him in November 2019.  He was doing well at that time.  He says that he has been having some shortness of breath with exertion.  He is not been working out as much as previous.  He does not have any chest pain, pressure, or tightness.  Past Medical History:  Diagnosis Date   At risk for sleep apnea    STOP-BANG= 4     SENT TO PCP 04-15-2014   BRBPR (bright red blood per rectum) 07/14/2015   ED (erectile dysfunction) 03/10/2013   Epididymal cyst    LEFT   GERD (gastroesophageal reflux disease)    Heart murmur mild mvp  -- asymptomatic   per pt echo 2005   History of adenomatous polyp of colon    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 Reece Agar with [facility] for benign noduarity in left hemiscrotum (2008) felt to be variant of normal.   Left nephrolithiasis    Testicular pain 12/01/2010   Per Dr. Gaynelle Arabian, thought to be due to epididymal cysts    Thoracic aortic aneurysm ([facility]) 04/18/2017   VARICOCELE 02/17/2010   Qualifier: Diagnosis of  By: Fuller Plan CMA (AAMA), [name]      Current Outpatient Medications  Medication Sig Dispense Refill   acetaminophen (TYLENOL) 500 MG tablet Take 500 mg by mouth every 6 (six) hours as needed.     aspirin 81 MG tablet Take 81 mg  by mouth daily.       atorvastatin (LIPITOR) 10 MG tablet Take 1 tablet (10 mg total) by mouth every evening. 90 tablet 3   sildenafil (REVATIO) 20 MG tablet TAKE 3-5 TABLETS BY MOUTH DAILY AS NEEDED 50 tablet prn   Testosterone 200 MG PLLT by Implant route.     No current facility-administered medications for this visit.     Physical Exam BP 136/76    Pulse 75    Temp 97.7 F (36.5 C) (Skin)    Resp 20    Ht 6\' 5"  (1.956 m)    Wt 245 lb (111.1 kg)    SpO2 97% Comment: RA   BMI 29.71 kg/m  58 year old man in no acute distress Alert and oriented x3 with no focal deficits Carotids-transmitted murmur bilaterally Cardiac regular rate and rhythm with a 2/6 systolic murmur Lungs clear with equal breath sounds bilaterally No peripheral edema  Diagnostic Tests: MRA CHEST WITH OR WITHOUT CONTRAST  TECHNIQUE: Angiographic images of the chest were obtained using MRA technique without and with intravenous contrast.  CONTRAST:  52mL MULTIHANCE GADOBENATE DIMEGLUMINE 529 MG/ML IV SOLN  COMPARISON:  CT 05/20/2018 and previous  FINDINGS: VASCULAR  Aorta: Fusiform ascending thoracic aortic aneurysm measured up to 4.8 cm diameter, stable since previous. Proximal arch 3.4 cm, distal arch/proximal descending 3.5 cm. No evidence of dissection or stenosis. Classic 3 vessel brachiocephalic arterial origin anatomy without proximal stenosis. Visualized proximal abdominal segment unremarkable.  Heart: Normal in size.  No pericardial effusion.  Pulmonary Arteries: Unremarkable centrally. Limited evaluation of segmental and subsegmental branches.  Other: None  NON-VASCULAR  Spinal cord: No pathologic findings  Brachial plexus: No evidence of mass  Muscles and tendons: Negative limited evaluation  Bones: Usual spurring in the lower thoracic spine.  Joints: Limited evaluation  IMPRESSION: VASCULAR  1. Stable 4.8 cm ascending thoracic aortic aneurysm.  Recommend semi-annual imaging followup by CTA or MRA and referral to cardiothoracic surgery if not already obtained. This recommendation follows 2010 ACCF/AHA/AATS/ACR/ASA/SCA/SCAI/SIR/STS/SVM Guidelines for the Diagnosis and Management of Patients With Thoracic Aortic Disease. Circulation. 2010; 121: W546-E703  NON-VASCULAR  1. No acute findings   Electronically Signed   By: Lucrezia Europe M.D.   On: 12/04/2018 11:45 IMPRESSIONS    1. The left ventricle has normal systolic function with an ejection fraction of 60-65%. The cavity size was normal. Left ventricular diastolic function could not be evaluated due to nondiagnostic images. No evidence of left ventricular regional wall  motion abnormalities.  2. The right ventricle has normal systolic function. The cavity was normal. There is no increase in right ventricular wall thickness.  3. There is mild mitral annular calcification present.  4. The aortic valve is tricuspid. Moderate sclerosis of the aortic valve. Aortic valve regurgitation is mild to moderate by color flow Doppler. Very mild stenosis of the aortic valve.  5. There is moderate dilatation of the ascending aorta and at the level of the sinuses of Valsalva measuring 46 mm.  I personally reviewed the MR angiogram images and concur with the findings noted above  Impression: Steven [name] is a 58 year old man with a history of tobacco abuse, heart murmur, mild aortic stenosis, mild to moderate aortic insufficiency, a 4.8 cm ascending aneurysm, hyperlipidemia, gastroesophageal reflux, nephrolithiasis, and a positive PPD.   Ascending aneurysm-stable at 4.8 cm.  No indication for surgery at present time.  Needs continued semiannual follow-up.  Tobacco abuse-quit smoking about 10 years ago, needs continued CT screening annually.  Plan: Return in 6 months with CT angiogram of chest  [name], MD [facility] [phone]

## 2018-12-12 ENCOUNTER — Encounter: Payer: Self-pay | Admitting: Gastroenterology

## 2018-12-23 ENCOUNTER — Ambulatory Visit: Payer: BC Managed Care – PPO | Admitting: *Deleted

## 2018-12-23 ENCOUNTER — Other Ambulatory Visit: Payer: Self-pay

## 2018-12-23 VITALS — Ht 74.0 in | Wt 242.0 lb

## 2018-12-23 DIAGNOSIS — Z86010 Personal history of colonic polyps: Secondary | ICD-10-CM

## 2018-12-23 MED ORDER — PEG 3350-KCL-NA BICARB-NACL 420 G PO SOLR: 4000.0000 mL | Freq: Once | ORAL | 0 refills | Status: AC

## 2018-12-23 NOTE — Progress Notes (Signed)
No egg or soy allergy known to patient  No issues with past sedation with any surgeries  or procedures, no intubation problems  No diet pills per patient No home 02 use per patient  No blood thinners per patient  Pt denies issues with constipation  No A fib or A flutter  EMMI video sent to pt's e mail   Pt verified name, DOB, address and insurance during PV today. Pt mailed instruction packet to included paper to complete and mail back to [facility] with addressed and stamped envelope, Emmi video, copy of consent form to read and not return, and instructions.PV completed over the phone. Pt encouraged to call with questions or issues    Pt is aware that care partner will wait in the car during parking lot; if they feel like they will be too hot to wait in the car; they may wait in the lobby.  We want them to wear a mask (we do not have any that we can provide them), practice social distancing, and we will check their temperatures when they get here.  I did remind patient that their care partner needs to stay in the parking lot the entire time. Pt will wear mask into building

## 2018-12-24 ENCOUNTER — Encounter: Payer: Self-pay | Admitting: Gastroenterology

## 2019-01-05 ENCOUNTER — Telehealth: Payer: Self-pay | Admitting: Gastroenterology

## 2019-01-05 NOTE — Telephone Encounter (Signed)

## 2019-01-06 ENCOUNTER — Ambulatory Visit (AMBULATORY_SURGERY_CENTER): Payer: BC Managed Care – PPO | Admitting: Gastroenterology

## 2019-01-06 ENCOUNTER — Encounter: Payer: Self-pay | Admitting: Gastroenterology

## 2019-01-06 ENCOUNTER — Other Ambulatory Visit: Payer: Self-pay

## 2019-01-06 VITALS — BP 97/62 | HR 72 | Temp 98.2°F | Resp 15 | Ht 77.0 in | Wt 245.0 lb

## 2019-01-06 DIAGNOSIS — D124 Benign neoplasm of descending colon: Secondary | ICD-10-CM

## 2019-01-06 DIAGNOSIS — D128 Benign neoplasm of rectum: Secondary | ICD-10-CM | POA: Diagnosis not present

## 2019-01-06 DIAGNOSIS — Z1211 Encounter for screening for malignant neoplasm of colon: Secondary | ICD-10-CM | POA: Diagnosis not present

## 2019-01-06 DIAGNOSIS — D122 Benign neoplasm of ascending colon: Secondary | ICD-10-CM

## 2019-01-06 DIAGNOSIS — Z86010 Personal history of colonic polyps: Secondary | ICD-10-CM

## 2019-01-06 MED ORDER — SODIUM CHLORIDE 0.9 % IV SOLN: 500.0000 mL | Freq: Once | INTRAVENOUS | Status: DC

## 2019-01-06 NOTE — Progress Notes (Signed)
Called to room to assist during endoscopic procedure.  Patient ID and intended procedure confirmed with present staff. Received instructions for my participation in the procedure from the performing physician.  

## 2019-01-06 NOTE — Progress Notes (Signed)
Pt's states no medical or surgical changes since previsit or office visit. 

## 2019-01-06 NOTE — Patient Instructions (Signed)
Read all handouts given to you by your recovery room nurse.  Thank-yo for choosing [place] for your healthcare need today.  YOU HAD AN ENDOSCOPIC PROCEDURE TODAY AT [facility]:   Refer to the procedure report that was given to you for any specific questions about what was found during the examination.  If the procedure report does not answer your questions, please call your gastroenterologist to clarify.  If you requested that your care partner not be given the details of your procedure findings, then the procedure report has been included in a sealed envelope for you to review at your convenience later.  YOU SHOULD EXPECT: Some feelings of bloating in the abdomen. Passage of more gas than usual.  Walking can help get rid of the air that was put into your GI tract during the procedure and reduce the bloating. If you had a lower endoscopy (such as a colonoscopy or flexible sigmoidoscopy) you may notice spotting of blood in your stool or on the toilet paper. If you underwent a bowel prep for your procedure, you may not have a normal bowel movement for a few days.  Please Note:  You might notice some irritation and congestion in your nose or some drainage.  This is from the oxygen used during your procedure.  There is no need for concern and it should clear up in a day or so.  SYMPTOMS TO REPORT IMMEDIATELY:   Following lower endoscopy (colonoscopy or flexible sigmoidoscopy):  Excessive amounts of blood in the stool  Significant tenderness or worsening of abdominal pains  Swelling of the abdomen that is new, acute  Fever of 100F or higher   Black, tarry-looking stools  For urgent or emergent issues, a gastroenterologist can be reached at any hour by calling [phone].   DIET:  We do recommend a small meal at first, but then you may proceed to your regular diet.  Drink plenty of fluids but you should avoid alcoholic beverages for 24 hours. Try to eat a high fiber diet, and drink  plenty of water.  ACTIVITY:  You should plan to take it easy for the rest of today and you should NOT DRIVE or use heavy machinery until tomorrow (because of the sedation medicines used during the test).    FOLLOW UP: Our staff will call the number listed on your records 48-72 hours following your procedure to check on you and address any questions or concerns that you may have regarding the information given to you following your procedure. If we do not reach you, we will leave a message.  We will attempt to reach you two times.  During this call, we will ask if you have developed any symptoms of COVID 19. If you develop any symptoms (ie: fever, flu-like symptoms, shortness of breath, cough etc.) before then, please call [phone].  If you test positive for Covid 19 in the 2 weeks post procedure, please call and report this information to [place].    If any biopsies were taken you will be contacted by phone or by letter within the next 1-3 weeks.  Please call us at [phone] if you have not heard about the biopsies in 3 weeks.    SIGNATURES/CONFIDENTIALITY: You and/or your care partner have signed paperwork which will be entered into your electronic medical record.  These signatures attest to the fact that that the information above on your After Visit Summary has been reviewed and is understood.  Full responsibility of the  confidentiality of this discharge information lies with you and/or your care-partner. 

## 2019-01-06 NOTE — Progress Notes (Signed)
Report given to PACU, vss 

## 2019-01-06 NOTE — Op Note (Signed)
[facility] Patient Name: Steven [name] Procedure Date: 01/06/2019 8:49 AM MRN: [id number] Endoscopist: Milus Banister , MD Age: 58 Referring MD:  Date of Birth: Aug 11, 1960 Gender: Male Account #: [id number] Procedure:                Colonoscopy Indications:              High risk colon cancer surveillance: Personal                            history of colonic polyps; July 2015 for                            colonoscopy which was done for history of                            adenomatous colon polyps. He had 2 small polyps                            removed from the descending colon each about 2 mm                            in size. These were tubular adenomas, otherwise                            negative exam Medicines:                Monitored Anesthesia Care Procedure:                Pre-Anesthesia Assessment:                           - Prior to the procedure, a History and Physical                            was performed, and patient medications and                            allergies were reviewed. The patient's tolerance of                            previous anesthesia was also reviewed. The risks                            and benefits of the procedure and the sedation                            options and risks were discussed with the patient.                            All questions were answered, and informed consent                            was obtained. Prior Anticoagulants: The patient has  taken no previous anticoagulant or antiplatelet                            agents. ASA Grade Assessment: II - A patient with                            mild systemic disease. After reviewing the risks                            and benefits, the patient was deemed in                            satisfactory condition to undergo the procedure.                           After obtaining informed consent, the colonoscope          was passed under direct vision. Throughout the                            procedure, the patient's blood pressure, pulse, and                            oxygen saturations were monitored continuously. The                            Colonoscope was introduced through the anus and                            advanced to the the cecum, identified by                            appendiceal orifice and ileocecal valve. The                            colonoscopy was performed without difficulty. The                            patient tolerated the procedure well. The quality                            of the bowel preparation was good. The ileocecal                            valve, appendiceal orifice, and rectum were                            photographed. Scope In: 9:01:52 AM Scope Out: 9:16:44 AM Scope Withdrawal Time: 0 hours 11 minutes 53 seconds  Total Procedure Duration: 0 hours 14 minutes 52 seconds  Findings:                 Four sessile polyps were found in the rectum,  descending colon and ascending colon. The polyps                            were 3 to 7 mm in size. These polyps were removed                            with a cold snare. Resection and retrieval were                            complete.                           Multiple small and large-mouthed diverticula were                            found in the left colon.                           The exam was otherwise without abnormality on                            direct and retroflexion views. Complications:            No immediate complications. Estimated blood loss:                            None. Estimated Blood Loss:     Estimated blood loss: none. Impression:               - Four 3 to 7 mm polyps in the rectum, in the                            descending colon and in the ascending colon,                            removed with a cold snare. Resected and retrieved.                            - Diverticulosis in the left colon.                           - The examination was otherwise normal on direct                            and retroflexion views. Recommendation:           - Patient has a contact number available for                            emergencies. The signs and symptoms of potential                            delayed complications were discussed with the                            patient.  Return to normal activities tomorrow.                            Written discharge instructions were provided to the                            patient.                           - Resume previous diet.                           - Continue present medications.                           You will receive a letter within 2-3 weeks with the                            pathology results and my final recommendations.                           If the polyp(s) is proven to be 'pre-cancerous' on                            pathology, you will need repeat colonoscopy in 3-7                            years. If the polyp(s) is NOT 'precancerous' on                            pathology then you should repeat colon cancer                            screening in 10 years with colonoscopy without need                            for colon cancer screening by any method prior to                            then (including stool testing). Milus Banister, MD 01/06/2019 9:19:48 AM This report has been signed electronically.

## 2019-01-08 ENCOUNTER — Telehealth: Payer: Self-pay

## 2019-01-08 NOTE — Telephone Encounter (Signed)
  Follow up Call-  Call back number 01/06/2019 05/01/2017  Post procedure Call Back phone  # [phone]  Permission to leave phone message Yes Yes  Some recent data might be hidden     Patient questions:  Do you have a fever, pain , or abdominal swelling? No. Pain Score  0 *  Have you tolerated food without any problems? Yes.    Have you been able to return to your normal activities? Yes.    Do you have any questions about your discharge instructions: Diet   No. Medications  No. Follow up visit  No.  Do you have questions or concerns about your Care? No.  Actions: * If pain score is 4 or above: 1. No action needed, pain <4.Have you developed a fever since your procedure? no  2.   Have you had an respiratory symptoms (SOB or cough) since your procedure? no  3.   Have you tested positive for COVID 19 since your procedure no  4.   Have you had any family members/close contacts diagnosed with the COVID 19 since your procedure?  no   If yes to any of these questions please route to Joylene John, RN and Alphonsa Gin, Therapist, sports.

## 2019-01-09 ENCOUNTER — Encounter: Payer: Self-pay | Admitting: Gastroenterology

## 2019-02-03 IMAGING — CT CT ABD-PELV W/ CM
2 of 5 series · 16 of 46 positions shown, 18 images · IV contrast (ISOVUE 300)
Comparison: 03/30/2014

CLINICAL DATA: One month of abdominal pain and bloating. Rectal
bleeding 1 week ago. History of normal colonoscopy 3 years ago.

These results will be called to the ordering clinician or
representative by the [HOSPITAL] at the imaging location.
EXAM:
CT ABDOMEN AND PELVIS WITH CONTRAST
TECHNIQUE: Multidetector CT imaging of the abdomen and pelvis was performed
using the standard protocol following bolus administration of
intravenous contrast.
CONTRAST:  100mL [id number] IV

[Series 2: abd/pel w · axial · 0.91mm/px · z∈[-542,-87]mm · 13 of 103 slices shown, 15 images]
[im 6/103  soft-tissue]
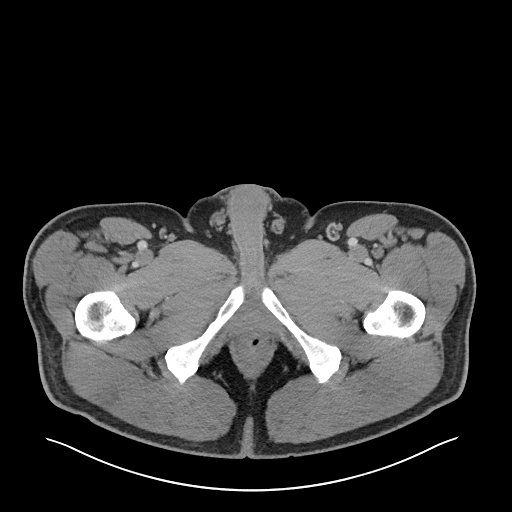
[im 6/103  bone]
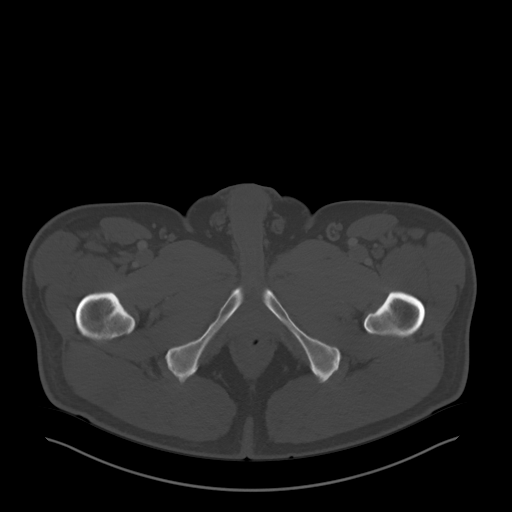
[im 17/103  soft-tissue]
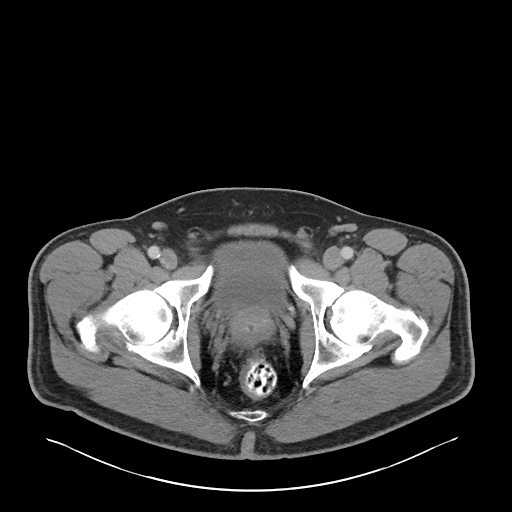
[im 22/103  soft-tissue]
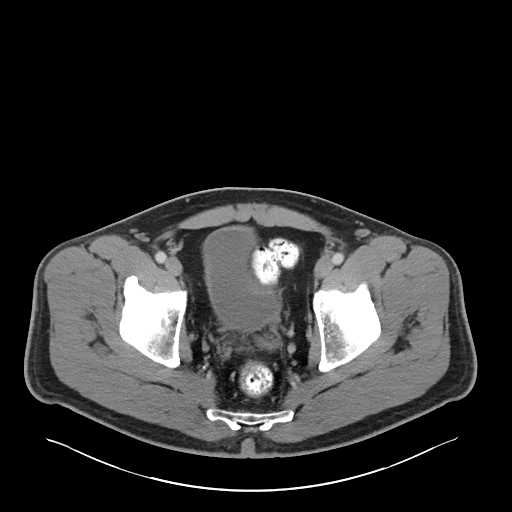
[im 27/103  soft-tissue]
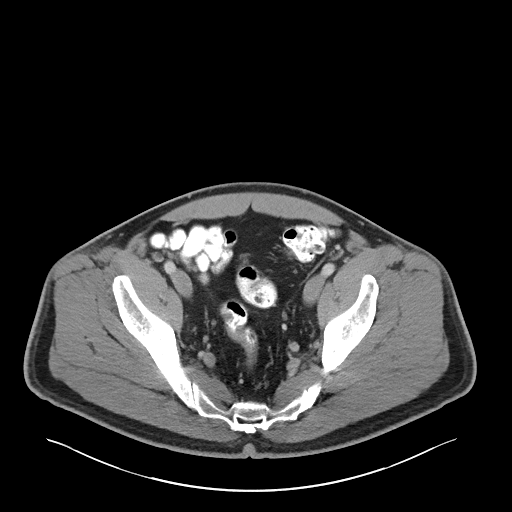
[im 38/103  soft-tissue]
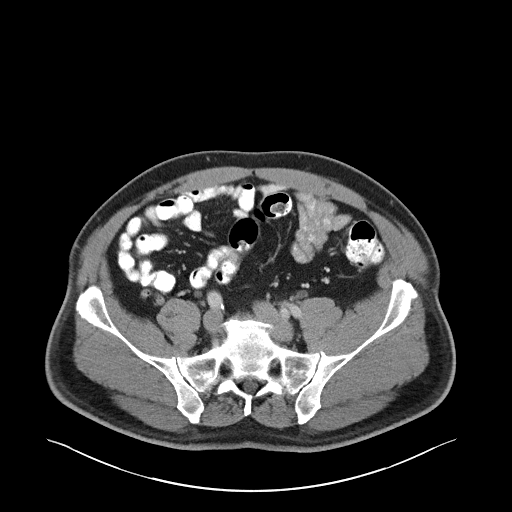
[im 43/103  soft-tissue]
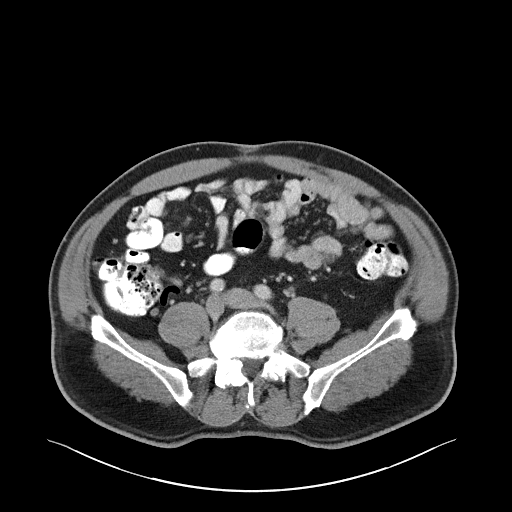
[im 54/103  soft-tissue]
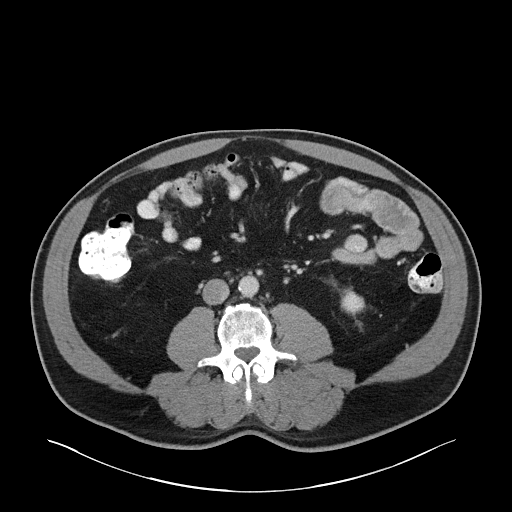
[im 60/103  soft-tissue]
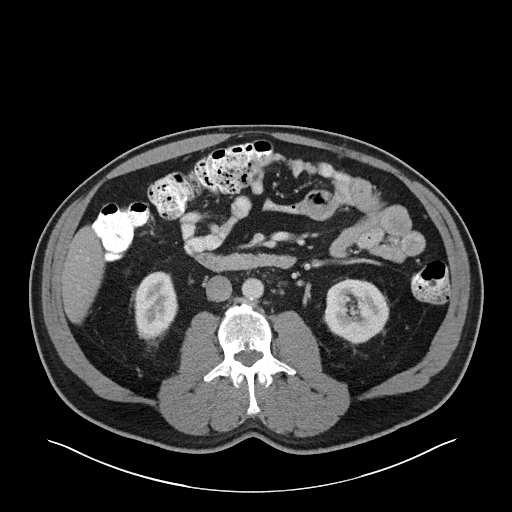
[im 65/103  soft-tissue]
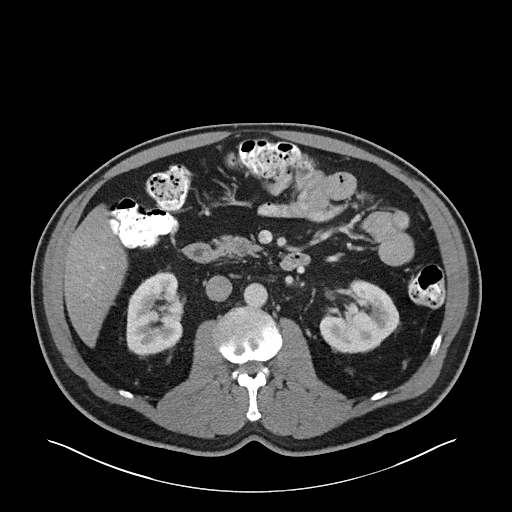
[im 65/103  bone]
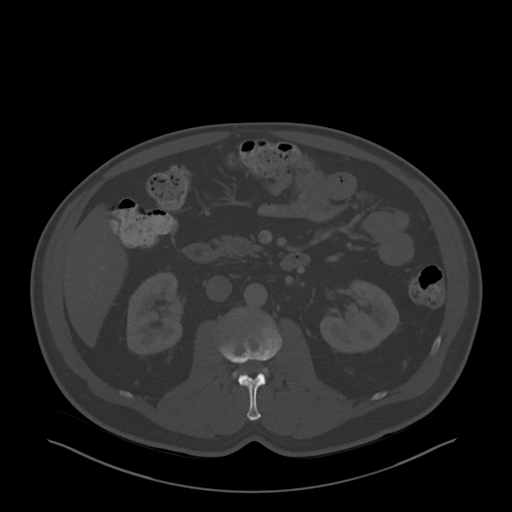
[im 76/103  soft-tissue]
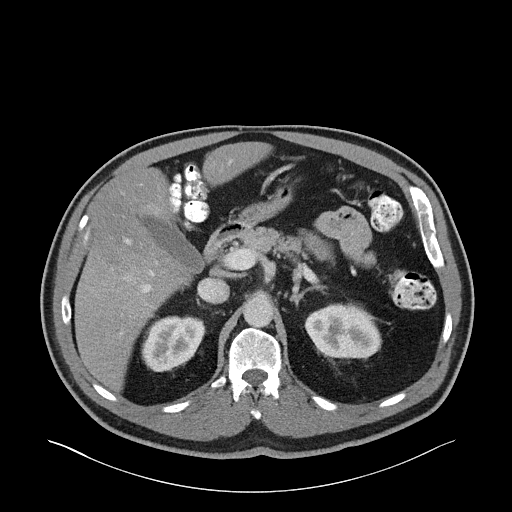
[im 81/103  soft-tissue]
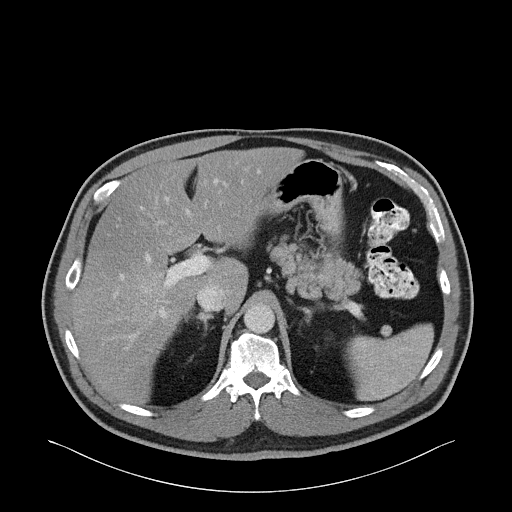
[im 86/103  soft-tissue]
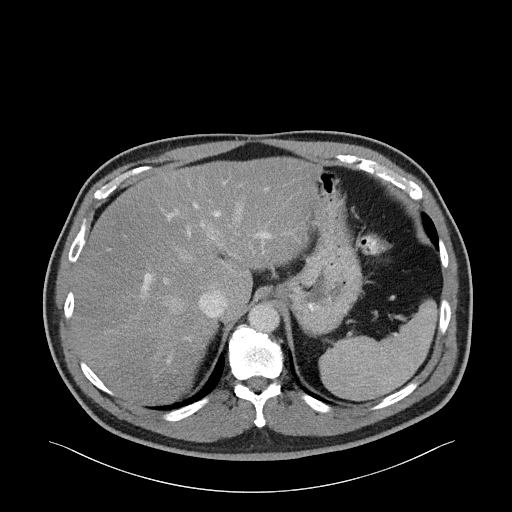
[im 97/103  soft-tissue]
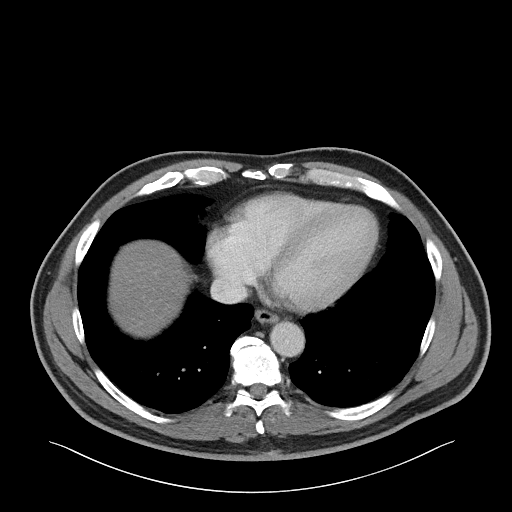

[Series 6: abd/pel w st · coronal · 0.82mm/px · 3 of 98 slices shown]
[im 33/98  soft-tissue]
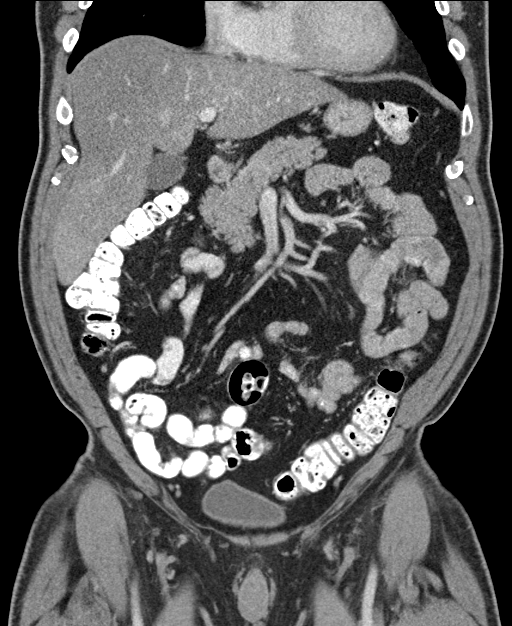
[im 44/98  soft-tissue]
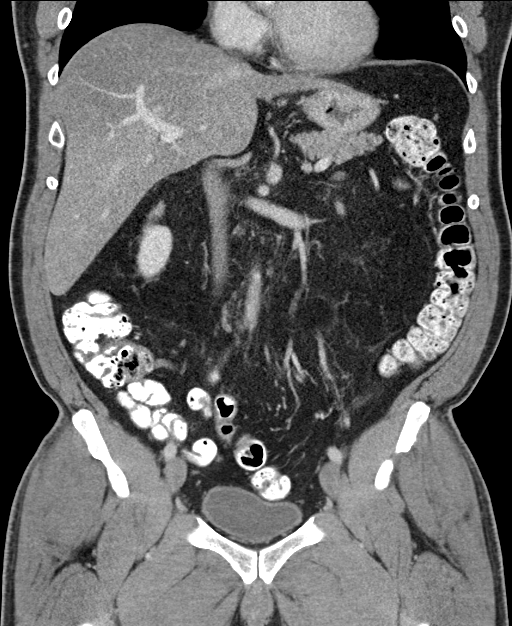
[im 54/98  soft-tissue]
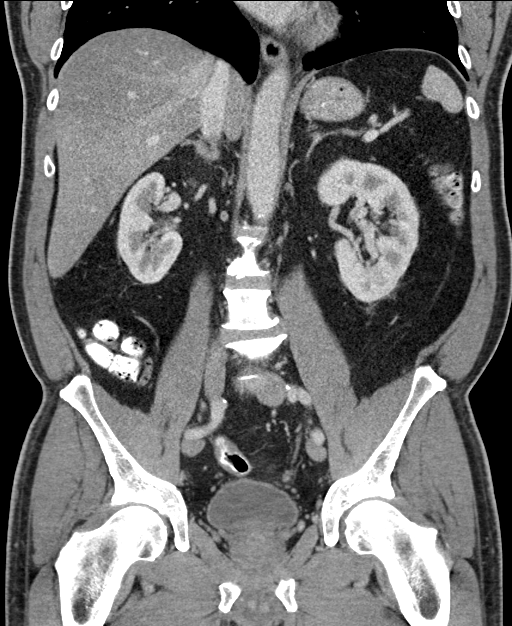

[16 of 46 positions shown; findings below may reference images not displayed]

FINDINGS: Lower chest:  No contributory findings.

Hepatobiliary: Hepatic steatosis. No evidence of liver lesion.No
evidence of biliary obstruction or stone.

Pancreas: Unremarkable.

Spleen: Unremarkable.

Adrenals/Urinary Tract: Negative adrenals. No hydronephrosis or
ureteral stone. 3 mm upper pole renal calculus on the left.
Unremarkable bladder.

Stomach/Bowel: No obstruction. No inflammatory changes. No noted
diverticula or other explanation for rectal bleeding.

Vascular/Lymphatic: No acute vascular abnormality. Atherosclerotic
calcification of the aorta that is overall mild. No mass or
adenopathy.

Reproductive:Small prostate calcification and tiny central prostate
cyst. No acute finding

Other: No ascites or pneumoperitoneum.

Musculoskeletal: No acute finding. Lumbar disc and facet
degeneration. Asymmetric right-sided endplate spurring and disc
height loss at L5-S1 causes notable L5 foraminal impingement.
IMPRESSION: 1. No acute finding or explanation for symptoms.
2. Hepatic steatosis.
3. 3 mm left renal calculus.

## 2019-02-26 ENCOUNTER — Other Ambulatory Visit: Payer: Self-pay | Admitting: Family Medicine

## 2019-02-26 NOTE — Telephone Encounter (Signed)
Electronic refill request. Sildenafil Last office visit:   05/29/2018 Last Filled:    50 tablet prn 11/22/2017  Please advise.

## 2019-02-27 NOTE — Telephone Encounter (Signed)
Sent. Thanks.   

## 2019-03-31 NOTE — Progress Notes (Signed)
Cardiology Office Note   Date:  04/01/2019   ID:  Steven [name], DOB [birth year], MRN [id number]  PCP:  Tonia Ghent, MD    No chief complaint on file.  Thoracic aneurysm  Wt Readings from Last 3 Encounters:  04/01/19 244 lb (110.7 kg)  01/06/19 245 lb (111.1 kg)  12/23/18 242 lb (109.8 kg)       History of Present Illness: Steven [name] is a 58 y.o. male  who is being seen today for the evaluation of thoracic aneurysm at the request of Tonia Ghent, MD.  He has been followed by Dr. Roxan Hockey for the aneurysm since 10/18, found incidentally on a CT scan for lung cancer prevention.   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   In 2019, it was noted: "He exercises regularly.  He goes to the gym several days a week and does mostly cardio.  He has been counseled to avoid heavy lifting and straining.  He has been counseled by Dr. Roxan Hockey about watching for any chest discomfort or tearing pain in his chest and to immediately seek attention if this were to occur.  He does have routine imaging planned for nodules and his aneurysm.  In 2019, he had some upper abdominal pain.  It occurred after he did a lot of yard work.  Since the last visit, he has felt well.  Denies : Chest pain. Dizziness. Leg edema. Nitroglycerin use. Orthopnea.  Paroxysmal nocturnal dyspnea. Shortness of breath. Syncope.   Rare palpitations lasting a few seconds.  He has done some biking and hiking for exercise.  Not 150 minute/week.   11/19 CT scan: Stable 4.9 cm ascending thoracic aortic aneurysm. Ascending thoracic aortic aneurysm. Recommend semi-annual imaging followup by CTA or MRA and referral to cardiothoracic surgery if not already obtained.   11/2018 echo:  1. The left ventricle has normal systolic function with an ejection fraction of 60-65%. The cavity size was normal. Left ventricular diastolic function could not be evaluated due to  nondiagnostic images. No evidence of left ventricular regional wall  motion abnormalities.  2. The right ventricle has normal systolic function. The cavity was normal. There is no increase in right ventricular wall thickness.  3. There is mild mitral annular calcification present.  4. The aortic valve is tricuspid. Moderate sclerosis of the aortic valve. Aortic valve regurgitation is mild to moderate by color flow Doppler. Very mild stenosis of the aortic valve.  5. There is moderate dilatation of the ascending aorta and at the level of the sinuses of Valsalva measuring 46 mm.  Past Medical History:  Diagnosis Date  . Allergy   . At risk for sleep apnea    STOP-BANG= 4     SENT TO PCP 04-15-2014  . BRBPR (bright red blood per rectum) 07/14/2015  . ED (erectile dysfunction) 03/10/2013  . Epididymal cyst    LEFT  . GERD (gastroesophageal reflux disease)   . Heart murmur mild mvp  -- asymptomatic   per pt echo 2005  . 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 Hx of adenomatous colonic polyps 04/01/2017  . Hyperglycemia 03/10/2013  . Hyperlipidemia   . Hypogonadism male    Previously evaluated by Dr. Reece Agar with URO for benign noduarity in left hemiscrotum (2008) felt to be variant of normal.  . Left nephrolithiasis   . Testicular pain 12/01/2010  Per Dr. Gaynelle Arabian, thought to be due to epididymal cysts   . Thoracic aortic aneurysm ([place]) 04/18/2017  . 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 COLONOSCOPY    . COLONOSCOPY WITH PROPOFOL  01-06-2014   POLYPECTOMY  . CYSTOSCOPY WITH RETROGRADE PYELOGRAM, URETEROSCOPY AND STENT PLACEMENT Left 04/19/2014   Procedure: CYSTOSCOPY WITH RETROGRADE PYELOGRAM with interpretation, URETEROSCOPY, stone extraction with basket, AND STENT PLACEMENT, implantation of backstop;  Surgeon: Ailene Rud, MD;  Location: [facility];  Service: Urology;  Laterality: Left;  . EXCISION SEBACEOUS CYST X3 OF BACK  03-13-2010  . HOLMIUM LASER APPLICATION Left 0000000   Procedure: HOLMIUM LASER APPLICATION;  Surgeon: Ailene Rud, MD;  Location[facility];  Service: Urology;  Laterality: Left;  . NASAL SEPTUM SURGERY  09-21-2002  . POLYPECTOMY    . REFRACTIVE SURGERY  1994     Current Outpatient Medications  Medication Sig Dispense Refill  . acetaminophen (TYLENOL) 500 MG tablet Take 500 mg by mouth every 6 (six) hours as needed.    Marland Kitchen aspirin 81 MG tablet Take 81 mg by mouth daily.      Marland Kitchen atorvastatin (LIPITOR) 10 MG tablet Take 1 tablet (10 mg total) by mouth every evening. 90 tablet 3  . sildenafil (REVATIO) 20 MG tablet TAKE 3-5 TABLETS BY MOUTH DAILY AS NEEDED 50 tablet PRN  . Testosterone 200 MG PLLT Inject as directed. Every 14-15 days 1 ml (217mcg)     No current facility-administered medications for this visit.     Allergies:   Crestor [rosuvastatin calcium], Naproxen sodium, Penicillins, Amoxicillin, and Erythromycin    Social History:  The patient  reports that he quit smoking about 10 years ago. His smoking use included cigarettes. He has a 51.00 pack-year smoking history. He has never used smokeless tobacco. He reports current alcohol use. He reports that he does not use drugs.   Family History:  The patient's family history includes Alzheimer's disease in his father; Cancer in his mother; Diabetes in his brother and another family member; Kidney disease in an other family member; Parkinsonism in his father.    ROS:  Please see the history of present illness.   Otherwise, review of systems are positive for intentional weight loss.   All other systems are reviewed and negative.    PHYSICAL EXAM: VS:  BP 130/76   Pulse 82   Ht 6\' 5"  (1.956 m)   Wt 244 lb (110.7 kg)   SpO2 96%   BMI 28.93 kg/m  , BMI Body mass index is 28.93 kg/m. GEN: Well nourished, well  developed, in no acute distress  HEENT: normal  Neck: no JVD, carotid bruits, or masses Cardiac: RRR; 2/6 systolic murmur, no rubs, or gallops,no edema  Respiratory:  clear to auscultation bilaterally, normal work of breathing GI: soft, nontender, nondistended, + BS MS: no deformity or atrophy  Skin: warm and dry, no rash Neuro:  Strength and sensation are intact Psych: euthymic mood, full affect   EKG:   The ekg ordered today demonstrates NSR, IRBBB pattern, lateral T wave inversion- no significant change   Recent Labs: 04/24/2018: ALT 33; BUN 13; Creatinine, Ser 1.02; Potassium 4.2; Sodium 137 05/16/2018: Hemoglobin 16.6; Platelets 187.0   Lipid Panel    Component Value Date/Time   CHOL 178 04/10/2018 1118   TRIG 170.0 (H) 04/10/2018 1118  HDL 38.10 (L) 04/10/2018 1118   CHOLHDL 5 04/10/2018 1118   VLDL 34.0 04/10/2018 1118   LDLCALC 106 (H) 04/10/2018 1118   LDLDIRECT 162.0 03/06/2017 1719     Other studies Reviewed: Additional studies/ records that were reviewed today with results demonstrating: .   ASSESSMENT AND PLAN:  1. Thoracic aneurysm: Follwed by CT surgery.  Echo showed only mild to moderate AI.  2. Hyperlipidemia: LDL 109 in 2019.  Due for repeat blood with PMD.  3. LVH: No CHF sx. 4. MVP: no severe MR on echo.   5. Palpitations Likely premature beats.  He will let us know if he has more sustained symptoms.     Current medicines are reviewed at length with the patient today.  The patient concerns regarding his medicines were addressed.  The following changes have been made:  No change  Labs/ tests ordered today include:  No orders of the defined types were placed in this encounter.   Recommend 150 minutes/week of aerobic exercise Low fat, low carb, high fiber diet recommended  Disposition:   FU in 1 year   Signed, Larae Grooms, MD  04/01/2019 8:26 AM    [facility] [address], [place], [place]  [address] Phone:  [phone]; Fax: [phone]

## 2019-04-01 ENCOUNTER — Other Ambulatory Visit: Payer: Self-pay

## 2019-04-01 ENCOUNTER — Ambulatory Visit (INDEPENDENT_AMBULATORY_CARE_PROVIDER_SITE_OTHER): Payer: BC Managed Care – PPO | Admitting: Interventional Cardiology

## 2019-04-01 ENCOUNTER — Encounter: Payer: Self-pay | Admitting: Interventional Cardiology

## 2019-04-01 VITALS — BP 130/76 | HR 82 | Ht 77.0 in | Wt 244.0 lb

## 2019-04-01 DIAGNOSIS — R1012 Left upper quadrant pain: Secondary | ICD-10-CM | POA: Diagnosis not present

## 2019-04-01 DIAGNOSIS — E782 Mixed hyperlipidemia: Secondary | ICD-10-CM

## 2019-04-01 DIAGNOSIS — I517 Cardiomegaly: Secondary | ICD-10-CM

## 2019-04-01 DIAGNOSIS — I712 Thoracic aortic aneurysm, without rupture, unspecified: Secondary | ICD-10-CM

## 2019-04-01 DIAGNOSIS — R002 Palpitations: Secondary | ICD-10-CM

## 2019-04-01 NOTE — Patient Instructions (Signed)
Medication Instructions:  Your physician recommends that you continue on your current medications as directed. Please refer to the Current Medication list given to you today.  If you need a refill on your cardiac medications before your next appointment, please call your pharmacy.   Lab work: None Ordered  If you have labs (blood work) drawn today and your tests are completely normal, you will receive your results only by: Marland Kitchen MyChart Message (if you have MyChart) OR . A paper copy in the mail If you have any lab test that is abnormal or we need to change your treatment, we will call you to review the results.  Testing/Procedures: None ordered  Follow-Up: At [facility], you and your health needs are our priority.  As part of our continuing mission to provide you with exceptional heart care, we have created designated Provider Care Teams.  These Care Teams include your primary Cardiologist (physician) and Advanced Practice Providers (APPs -  Physician Assistants and Nurse Practitioners) who all work together to provide you with the care you need, when you need it. . You will need a follow up appointment in 1 year.  Please call our office 2 months in advance to schedule this appointment.  You may see Casandra Doffing, MD or one of the following Advanced Practice Providers on your designated Care Team:   . Lyda Jester, PA-C . Dayna Dunn, PA-C . Ermalinda Barrios, PA-C  Any Other Special Instructions Will Be Listed Below (If Applicable).   Your provider recommends that you maintain 150 minutes per week of moderate aerobic activity.

## 2019-04-02 ENCOUNTER — Ambulatory Visit (INDEPENDENT_AMBULATORY_CARE_PROVIDER_SITE_OTHER): Payer: BC Managed Care – PPO

## 2019-04-02 DIAGNOSIS — Z23 Encounter for immunization: Secondary | ICD-10-CM | POA: Diagnosis not present

## 2019-05-11 ENCOUNTER — Other Ambulatory Visit: Payer: Self-pay | Admitting: *Deleted

## 2019-05-11 DIAGNOSIS — Z122 Encounter for screening for malignant neoplasm of respiratory organs: Secondary | ICD-10-CM

## 2019-05-11 DIAGNOSIS — Z87891 Personal history of nicotine dependence: Secondary | ICD-10-CM

## 2019-05-15 ENCOUNTER — Other Ambulatory Visit: Payer: Self-pay | Admitting: Thoracic Surgery (Cardiothoracic Vascular Surgery)

## 2019-05-15 DIAGNOSIS — I712 Thoracic aortic aneurysm, without rupture, unspecified: Secondary | ICD-10-CM

## 2019-06-09 ENCOUNTER — Ambulatory Visit
Admission: RE | Admit: 2019-06-09 | Discharge: 2019-06-09 | Disposition: A | Discharge status: Home / Self Care | Payer: BC Managed Care – PPO | Source: Ambulatory Visit | Attending: Thoracic Surgery (Cardiothoracic Vascular Surgery) | Admitting: Thoracic Surgery (Cardiothoracic Vascular Surgery)

## 2019-06-09 DIAGNOSIS — I712 Thoracic aortic aneurysm, without rupture, unspecified: Secondary | ICD-10-CM

## 2019-06-09 DIAGNOSIS — I77810 Thoracic aortic ectasia: Secondary | ICD-10-CM | POA: Diagnosis not present

## 2019-06-09 MED ORDER — IOPAMIDOL (ISOVUE-370) INJECTION 76%
75.0000 mL | Freq: Once | INTRAVENOUS | Status: AC | PRN
  Administered 2019-06-09: 75 mL via INTRAVENOUS

## 2019-06-10 ENCOUNTER — Other Ambulatory Visit: Payer: BC Managed Care – PPO

## 2019-06-16 ENCOUNTER — Telehealth: Payer: BC Managed Care – PPO | Admitting: Thoracic Surgery (Cardiothoracic Vascular Surgery)

## 2019-06-16 ENCOUNTER — Other Ambulatory Visit: Payer: Self-pay

## 2019-06-17 ENCOUNTER — Other Ambulatory Visit: Payer: Self-pay

## 2019-06-17 ENCOUNTER — Telehealth (INDEPENDENT_AMBULATORY_CARE_PROVIDER_SITE_OTHER): Payer: BC Managed Care – PPO | Admitting: Thoracic Surgery (Cardiothoracic Vascular Surgery)

## 2019-06-17 DIAGNOSIS — I712 Thoracic aortic aneurysm, without rupture: Secondary | ICD-10-CM

## 2019-06-17 DIAGNOSIS — I7121 Aneurysm of the ascending aorta, without rupture: Secondary | ICD-10-CM

## 2019-06-17 NOTE — Progress Notes (Signed)
[address]       [place] [address]             [phone]       Steven [name] is a 58 year old gentleman with a known ascending aortic aneurysm who is due for 26-month follow-up.  This visit is being conducted by phone due to an in person visit cancellation and also patient request due to the current COVID-19 pandemic.  Identity was confirmed with 2 identifiers.  Steven [name] is a 58 year old man with history of tobacco abuse, heart murmur, hyperlipidemia, reflux, nephrolithiasis, positive PPD, and a 4.9 cm ascending aneurysm.  His aneurysm was first noted on a CT scan done for lung cancer screening.  That was in the fall 2018.  He has been followed since then.  He quit smoking about 10 years ago.  He feels well.  He is working from home due to the pandemic.  He has been social distancing.  He is not having any chest pain, pressure, tightness, or shortness of breath.  He does not check his blood pressure at home on a regular basis.  CT ANGIOGRAPHY CHEST WITH CONTRAST  TECHNIQUE: Multidetector CT imaging of the chest was performed using the standard protocol during bolus administration of intravenous contrast. Multiplanar CT image reconstructions and MIPs were obtained to evaluate the vascular anatomy.  CONTRAST:  95mL ISOVUE-370 IOPAMIDOL (ISOVUE-370) INJECTION 76%  COMPARISON:  May 20, 2018  FINDINGS: Cardiovascular: Ascending thoracic aortic diameter is again noted to measure 4.9 x 4.9 cm. The measured diameter at the sinuses of Valsalva measures 3.8 cm in diameter. Measured diameter at the sinotubular junction is 3.0 cm. Measured diameter at the aortic arch measures 3.2 cm. Measured diameter in the descending thoracic aorta at the main pulmonary outflow tract measures 2.9 x 2.9 cm. There is no evident dissection. Visualized great vessels appear unremarkable. There is no demonstrable pulmonary embolus. There is no pericardial effusion or  pericardial thickening. There is mild aortic atherosclerosis.  Mediastinum/Nodes: Thyroid appears unremarkable. No appreciable adenopathy in the thoracic region. No esophageal lesions appreciable.  Lungs/Pleura: There is relatively mild centrilobular and paraseptal emphysematous change. Small bullae are noted in the right apex region, stable. There is no appreciable edema or consolidation. No pleural effusions are evident. There is a stable 4 mm nodular opacity abutting the pleura in the medial segment of the right middle lobe, seen on axial slice XX123456 series 9. A 3 mm nodular opacity abutting the pleura in the medial segment right middle lobe seen on axial slice 89 series 9 is stable. No new nodular opacities are evident.  Upper Abdomen: Visualized upper abdominal structures appear unremarkable.  Musculoskeletal: There is degenerative change in the thoracic spine. There are no blastic or lytic bone lesions. No chest wall lesions.  Review of the MIP images confirms the above findings.  IMPRESSION: 1. Stable aneurysmal dilatation in the ascending thoracic aorta with measured diameter 4.9 x 4.9 cm. Ascending thoracic aortic aneurysm. Recommend semi-annual imaging followup by CTA or MRA and referral to cardiothoracic surgery if not already obtained. This recommendation follows 2010 ACCF/AHA/AATS/ACR/ASA/SCA/SCAI/SIR/STS/SVM Guidelines for the Diagnosis and Management of Patients With Thoracic Aortic Disease. Circulation. 2010; [id number]. Aortic aneurysm NOS (ICD10-I71.9). No dissection.  2.  No demonstrable pulmonary embolus.  3. Underlying emphysematous change. No edema or consolidation. Stable 3-4 mm nodular opacities on the right.  4.  No evident adenopathy.  Aortic Atherosclerosis (ICD10-I70.0) and Emphysema (ICD10-J43.9).   Electronically Signed  By: Lowella Grip III M.D.   On: 06/09/2019 10:27 I personally reviewed the CT images and concur with  the findings noted above.  Impression  Steven [name] is a 58 year old man with a past history significant fortobacco abuse, heart murmur, hyperlipidemia, reflux, nephrolithiasis, positive PPD, and a 4.9 cm ascending aneurysm.  Ascending aneurysm/thoracic aortic atherosclerosis-stable at 4.9 cm.  Really unchanged over the past 2 years.  Needs continued semiannual follow-up.  I emphasized the importance of blood pressure control for his aneurysm.  He does not check his blood pressure at home, so I recommended that he get a cuff and do so.  Plan Return in 6 months with CT angiogram of chest

## 2019-11-16 ENCOUNTER — Other Ambulatory Visit: Payer: Self-pay | Admitting: Thoracic Surgery (Cardiothoracic Vascular Surgery)

## 2019-11-16 DIAGNOSIS — I712 Thoracic aortic aneurysm, without rupture, unspecified: Secondary | ICD-10-CM

## 2019-11-27 ENCOUNTER — Other Ambulatory Visit: Payer: Self-pay | Admitting: Family Medicine

## 2019-12-02 ENCOUNTER — Ambulatory Visit
Admission: RE | Admit: 2019-12-02 | Discharge: 2019-12-02 | Disposition: A | Discharge status: Home / Self Care | Payer: BC Managed Care – PPO | Source: Ambulatory Visit | Attending: Thoracic Surgery (Cardiothoracic Vascular Surgery) | Admitting: Thoracic Surgery (Cardiothoracic Vascular Surgery)

## 2019-12-02 ENCOUNTER — Other Ambulatory Visit: Payer: Self-pay

## 2019-12-02 DIAGNOSIS — I712 Thoracic aortic aneurysm, without rupture, unspecified: Secondary | ICD-10-CM

## 2019-12-02 MED ORDER — IOPAMIDOL (ISOVUE-370) INJECTION 76%
75.0000 mL | Freq: Once | INTRAVENOUS | Status: AC | PRN
  Administered 2019-12-02: 75 mL via INTRAVENOUS

## 2019-12-09 ENCOUNTER — Other Ambulatory Visit: Payer: BC Managed Care – PPO

## 2019-12-15 ENCOUNTER — Other Ambulatory Visit: Payer: Self-pay

## 2019-12-15 ENCOUNTER — Ambulatory Visit (INDEPENDENT_AMBULATORY_CARE_PROVIDER_SITE_OTHER): Payer: BC Managed Care – PPO | Admitting: Thoracic Surgery (Cardiothoracic Vascular Surgery)

## 2019-12-15 ENCOUNTER — Encounter: Payer: Self-pay | Admitting: Thoracic Surgery (Cardiothoracic Vascular Surgery)

## 2019-12-15 VITALS — BP 138/72 | HR 85 | Temp 97.3°F | Resp 20 | Ht 74.0 in | Wt 252.0 lb

## 2019-12-15 DIAGNOSIS — I712 Thoracic aortic aneurysm, without rupture: Secondary | ICD-10-CM

## 2019-12-15 DIAGNOSIS — I7121 Aneurysm of the ascending aorta, without rupture: Secondary | ICD-10-CM

## 2019-12-15 NOTE — Progress Notes (Signed)
[address]       [place] [address]             [phone]     HPI: Steven [name] returns for a scheduled follow-up visit  Steven [name] is a 59 year old man with a past medical history of thoracic aortic atherosclerosis, ascending thoracic aneurysm, heart murmur, mild to moderate AI, mild AS, nephrolithiasis, and a positive PPD.  He has been followed for several years now for an ascending aneurysm.  He last had a scan in December when we did a telephone visit.  His aneurysm was 4.9 cm at that time.  He says he sometimes gets short of breath when he first starts to exert himself but then can exert himself normally after that.  He does not have any orthopnea.  He gets a little swelling in his left leg more than the right.  He notices some stomach discomfort after eating.  He says that this does not feel like reflux.  He has not been having any chest pain, pressure, or tightness.  Past Medical History:  Diagnosis Date   Allergy    At risk for sleep apnea    STOP-BANG= 4     SENT TO PCP 04-15-2014   BRBPR (bright red blood per rectum) 07/14/2015   ED (erectile dysfunction) 03/10/2013   Epididymal cyst    LEFT   GERD (gastroesophageal reflux disease)    Heart murmur mild mvp  -- asymptomatic   per pt echo 2005   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 Reece Agar with [facility] for benign noduarity in left hemiscrotum (2008) felt to be variant of normal.   Left nephrolithiasis    Testicular pain 12/01/2010   Per Dr. Gaynelle Arabian, thought to be due to epididymal cysts    Thoracic aortic aneurysm ([place]) 04/18/2017   VARICOCELE 02/17/2010   Qualifier: Diagnosis of  By: Fuller Plan CMA (AAMA), [name]      Current Outpatient Medications  Medication Sig Dispense Refill   acetaminophen (TYLENOL) 500  MG tablet Take 500 mg by mouth every 6 (six) hours as needed.     aspirin 81 MG tablet Take 81 mg by mouth daily.       atorvastatin (LIPITOR) 10 MG tablet TAKE 1 TABLET BY MOUTH EVERY DAY IN THE EVENING 90 tablet 0   sildenafil (REVATIO) 20 MG tablet TAKE 3-5 TABLETS BY MOUTH DAILY AS NEEDED 50 tablet PRN   Testosterone 200 MG PLLT Inject as directed. Every 14-15 days 1 ml (232mcg) (Patient not taking: Reported on 12/15/2019)     No current facility-administered medications for this visit.    Physical Exam BP 138/72 (BP Location: Right Arm, Patient Position: Sitting, Cuff Size: Normal)    Pulse 85    Temp (!) 97.3 F (36.3 C) (Temporal)    Resp 20    Ht 6\' 2"  (1.88 m)    Wt 252 lb (114.3 kg)    SpO2 97% Comment: RA   BMI 32.49 kg/m  59 year old man in no acute distress Alert and oriented x3 with no focal deficits No carotid bruits Lungs clear bilaterally Cardiac regular rate and rhythm with a 2/6 systolic murmur  Diagnostic Tests: CT ANGIOGRAPHY CHEST WITH CONTRAST  TECHNIQUE: Multidetector CT imaging of  the chest was performed using the standard protocol during bolus administration of intravenous contrast. Multiplanar CT image reconstructions and MIPs were obtained to evaluate the vascular anatomy.  CONTRAST:  72mL ISOVUE-370 IOPAMIDOL (ISOVUE-370) INJECTION 76%  COMPARISON:  06/09/2019, 12/04/2018, 05/20/2018, 11/12/2017, 04/17/2017  FINDINGS: Cardiovascular: Preferential opacification of the thoracic aorta. Unchanged enlargement of the tubular ascending thoracic aorta, measuring up to 4.9 x 4.7 cm. The aortic valve measures 2.3 cm. Aortic valve calcifications. The sinuses of Valsalva measure 3.4 cm. Unchanged mild enlargement of the descending thoracic aorta, measuring up to 3.1 x 3.0 cm proximally. Normal caliber of the descending thoracic aorta near the diaphragmatic hiatus measuring up to 2.8 x 2.5 cm. Minimal aortic atherosclerosis. Normal heart size. No  pericardial effusion.  Mediastinum/Nodes: No enlarged mediastinal, hilar, or axillary lymph nodes. Thyroid gland, trachea, and esophagus demonstrate no significant findings.  Lungs/Pleura: Mild paraseptal emphysema. No pleural effusion or pneumothorax.  Upper Abdomen: No acute abnormality. Hepatic steatosis. Nonobstructive superior pole left nephrolithiasis.  Musculoskeletal: No chest wall abnormality. No acute or significant osseous findings.  Review of the MIP images confirms the above findings.  IMPRESSION: 1. Unchanged enlargement of the tubular ascending thoracic aorta, measuring up to 4.9 x 4.7 cm. Unchanged mild enlargement of the descending thoracic aorta, measuring up to 3.1 x 3.0 cm proximally. Aortic Atherosclerosis (ICD10-I70.0). 2. Aortic valve calcifications. 3. Emphysema (ICD10-J43.9). 4. Hepatic steatosis. 5. Nonobstructive superior pole left nephrolithiasis.   Electronically Signed   By: Eddie Candle M.D.   On: 12/02/2019 15:07 I personally reviewed the CT images and concur with the findings noted above.  No significant change.  Impression: Steven [name] is a 59 year old man with a past medical history of thoracic aortic atherosclerosis, ascending thoracic aneurysm, heart murmur, mild to moderate AI, mild AS, nephrolithiasis, a positive PPD, and remote tobacco abuse (quit 2010).   Thoracic aortic atherosclerosis/ascending thoracic aortic aneurysm-stable at 4.9 cm.  No indication for surgery, but needs continued semiannual follow-up.  He complains of some shortness of breath when he first starts to exert himself but then can proceed normally.  I doubt it is related to his aortic valve, but I do think we should repeat his echo in 6 months when I see him again.  Mild AS, mild to moderate AI-see above.  Will repeat echo in 6 months  His blood pressure was borderline today at 138/72.  He does not have a history of hypertension and is not on any  antihypertensive medication.  He does have a cuff at home and checks himself about once a week.  He says generally in the high 120s to low 130s.  I recommended that he check himself with a couple times a week and if he is seeing numbers in the 130s to 140s consistently he may need blood pressure medication.  Plan: Return in 6 months with MR angio of chest and 2D echocardiogram  I spent 20 minutes in review of records, images, and meeting with Steven [name] today. [name], MD [facility] [phone]

## 2020-01-25 ENCOUNTER — Telehealth: Payer: Self-pay

## 2020-01-25 NOTE — Telephone Encounter (Signed)
Called patient about his message through Mychart to get an appointment scheduled. See message below.  Appointment Request From: Huntley Dec Wollen  With Provider: Larae Grooms, MD [place]  Preferred Date Range: Any  Preferred Times: Any Time  Reason for visit: Office Visit  Comments: difficulty breathing with exercise, swelling in legs and ankles, tingling and low level pain in shoulders with exercise.   Patient complaining of shoulder pain/tingling with exercise, SOB with exercise, and BLE edema. Encouraged patient to elevate his feet and reduce his salt in his diet. Patient has history of aortic aneurysm, HLD, MVP, and palpitations. Patient has appointment towards the end of the month, moved patient's appointment to 8/13 to see Dr. Irish Lack. This was the first available appointment. Encouraged patient if his symptoms get worse before his appointment to give our office a call.

## 2020-01-31 ENCOUNTER — Other Ambulatory Visit: Payer: Self-pay | Admitting: Family Medicine

## 2020-01-31 DIAGNOSIS — E291 Testicular hypofunction: Secondary | ICD-10-CM

## 2020-01-31 DIAGNOSIS — E785 Hyperlipidemia, unspecified: Secondary | ICD-10-CM

## 2020-02-05 ENCOUNTER — Encounter: Payer: Self-pay | Admitting: Interventional Cardiology

## 2020-02-05 ENCOUNTER — Other Ambulatory Visit: Payer: Self-pay

## 2020-02-05 ENCOUNTER — Ambulatory Visit (INDEPENDENT_AMBULATORY_CARE_PROVIDER_SITE_OTHER): Payer: BC Managed Care – PPO | Admitting: Interventional Cardiology

## 2020-02-05 VITALS — BP 144/74 | HR 85 | Ht 74.0 in | Wt 248.2 lb

## 2020-02-05 DIAGNOSIS — R002 Palpitations: Secondary | ICD-10-CM

## 2020-02-05 DIAGNOSIS — I712 Thoracic aortic aneurysm, without rupture, unspecified: Secondary | ICD-10-CM

## 2020-02-05 DIAGNOSIS — E782 Mixed hyperlipidemia: Secondary | ICD-10-CM | POA: Diagnosis not present

## 2020-02-05 DIAGNOSIS — I517 Cardiomegaly: Secondary | ICD-10-CM | POA: Diagnosis not present

## 2020-02-05 DIAGNOSIS — I341 Nonrheumatic mitral (valve) prolapse: Secondary | ICD-10-CM

## 2020-02-05 DIAGNOSIS — R072 Precordial pain: Secondary | ICD-10-CM

## 2020-02-05 MED ORDER — METOPROLOL TARTRATE 100 MG PO TABS: ORAL_TABLET | ORAL | 0 refills | Status: DC

## 2020-02-05 NOTE — Patient Instructions (Addendum)
Medication Instructions:  Your physician recommends that you continue on your current medications as directed. Please refer to the Current Medication list given to you today.  *If you need a refill on your cardiac medications before your next appointment, please call your pharmacy*   Lab Work: TODAY: BMET, BNP  If you have labs (blood work) drawn today and your tests are completely normal, you will receive your results only by:  [place] (if you have MyChart) OR  A paper copy in the mail If you have any lab test that is abnormal or we need to change your treatment, we will call you to review the results.   Testing/Procedures: Your physician has requested that you have a cardiac CT and a CT of the chest to look at the aorta. We will try to arrange these at the same time.   Follow-Up: 12 mo in person with Dr. Irish Lack   Other Instructions Your cardiac CT will be scheduled at one of the below locations:   [facility] [address] [place], [place] [address] [phone]  [place] [address] [place], [place] [address] [phone]  If scheduled at [facility], please arrive at the [facility] main entrance of [facility] 30 minutes prior to test start time. Proceed to the [facility] Radiology Department (first floor) to check-in and test prep.  If scheduled at [facility], please arrive 15 mins early for check-in and test prep.  Please follow these instructions carefully (unless otherwise directed):  Hold all erectile dysfunction medications  (sildenafil) at least 3 days (72 hrs) prior to test.  On the Night Before the Test:  Be sure to Drink plenty of water.  Do not consume any caffeinated/decaffeinated beverages or chocolate 12 hours prior to your test.  Do not take any antihistamines 12 hours prior to your test.   On the Day of the  Test:  Drink plenty of water. Do not drink any water within one hour of the test.  Do not eat any food 4 hours prior to the test.  You may take your regular medications prior to the test.   Take metoprolol (Lopressor) 100 MG two hours prior to test.      After the Test:  Drink plenty of water.  After receiving IV contrast, you may experience a mild flushed feeling. This is normal.  On occasion, you may experience a mild rash up to 24 hours after the test. This is not dangerous. If this occurs, you can take Benadryl 25 mg and increase your fluid intake.  If you experience trouble breathing, this can be serious. If it is severe call 911 IMMEDIATELY. If it is mild, please call our office.   Once we have confirmed authorization from your insurance company, we will call you to set up a date and time for your test. Based on how quickly your insurance processes prior authorizations requests, please allow up to 4 weeks to be contacted for scheduling your Cardiac CT appointment. Be advised that routine Cardiac CT appointments could be scheduled as many as 8 weeks after your provider has ordered it.  For non-scheduling related questions, please contact the cardiac imaging nurse navigator should you have any questions/concerns: Marchia Bond, Cardiac Imaging Nurse Navigator Burley Saver, Interim Cardiac Imaging Nurse [facility] Direct Office Dial: [phone]   For scheduling needs, including cancellations and rescheduling, please call Vivien Rota  at [phone], option 3.

## 2020-02-05 NOTE — Progress Notes (Signed)
Cardiology Office Note   Date:  02/05/2020   ID:  Steven [name], DOB Jun 18, 1961, MRN [id number]  PCP:  Tonia Ghent, MD    No chief complaint on file.  Chest pain  Wt Readings from Last 3 Encounters:  02/05/20 248 lb 3.2 oz (112.6 kg)  12/15/19 252 lb (114.3 kg)  04/01/19 244 lb (110.7 kg)       History of Present Illness: Steven [name] is a 59 y.o. male  has been followed by Dr. Roxan Hockey for the aneurysm since 10/18, found incidentally on a CT scan for lung cancer prevention.   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  In 2019, it was noted: "He exercises regularly. He goes to the gym several days a week and does mostly cardio. He has been counseled to avoid heavy lifting and straining. He has been counseled by Dr. Roxan Hockey about watching for any chest discomfort or tearing pain in his chest and to immediately seek attention if this were to occur.  He does have routine imaging planned for nodules and his aneurysm.  In 2019, he had some upper abdominal pain. It occurred after he did a lot of yard work.  2020 echo showed: "11/2018 echo: 1. The left ventricle has normal systolic function with an ejection fraction of 60-65%. The cavity size was normal. Left ventricular diastolic function could not be evaluated due to nondiagnostic images. No evidence of left ventricular regional wall  motion abnormalities. 2. The right ventricle has normal systolic function. The cavity was normal. There is no increase in right ventricular wall thickness. 3. There is mild mitral annular calcification present. 4. The aortic valve is tricuspid. Moderate sclerosis of the aortic valve. Aortic valve regurgitation is mild to moderate by color flow Doppler. Very mild stenosis of the aortic valve. 5. There is moderate dilatation of the ascending aorta and at the level of the sinuses of Valsalva measuring 46 mm."   In early August 2021, he  called the office: "Patient complaining of shoulder pain/tingling with exercise, SOB with exercise, and BLE edema. Encouraged patient to elevate his feet and reduce his salt in his diet. Patient has history of aortic aneurysm, HLD, MVP, and palpitations. Patient has appointment towards the end of the month, moved patient's appointment to 8/13 to see Dr. Irish Lack. This was the first available appointment. Encouraged patient if his symptoms get worse before his appointment to give our office a call. "  He has had left leg swelling, more at the end of the day.  Had a severe leg injury about 12-13  years ago (2008).   Has some epigastric pain with walking. Also has some DOE.    Denies :  Dizziness.  Nitroglycerin use. Orthopnea.  Paroxysmal nocturnal dyspnea. Shortness of breath. Syncope.   Rare palpitations.   Last aortic dimension 4.9 cm.     Past Medical History:  Diagnosis Date  . Allergy   . At risk for sleep apnea    STOP-BANG= 4     SENT TO PCP 04-15-2014  . BRBPR (bright red blood per rectum) 07/14/2015  . ED (erectile dysfunction) 03/10/2013  . Epididymal cyst    LEFT  . GERD (gastroesophageal reflux disease)   . Heart murmur mild mvp  -- asymptomatic   per pt echo 2005  . 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 Hx of adenomatous colonic  polyps 04/01/2017  . Hyperglycemia 03/10/2013  . Hyperlipidemia   . Hypogonadism male    Previously evaluated by Dr. Reece Agar with [facility] for benign noduarity in left hemiscrotum (2008) felt to be variant of normal.  . Left nephrolithiasis   . Testicular pain 12/01/2010   Per Dr. Gaynelle Arabian, thought to be due to epididymal cysts   . Thoracic aortic aneurysm ([place]) 04/18/2017  . 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 COLONOSCOPY    . COLONOSCOPY WITH PROPOFOL  01-06-2014   POLYPECTOMY  . CYSTOSCOPY WITH RETROGRADE  PYELOGRAM, URETEROSCOPY AND STENT PLACEMENT Left 04/19/2014   Procedure: CYSTOSCOPY WITH RETROGRADE PYELOGRAM with interpretation, URETEROSCOPY, stone extraction with basket, AND STENT PLACEMENT, implantation of backstop;  Surgeon: Ailene Rud, MD;  Location: [facility];  Service: Urology;  Laterality: Left;  . EXCISION SEBACEOUS CYST X3 OF BACK  03-13-2010  . HOLMIUM LASER APPLICATION Left 45/80/9983   Procedure: HOLMIUM LASER APPLICATION;  Surgeon: Ailene Rud, MD;  Location: [facility];  Service: Urology;  Laterality: Left;  . NASAL SEPTUM SURGERY  09-21-2002  . POLYPECTOMY    . REFRACTIVE SURGERY  1994     Current Outpatient Medications  Medication Sig Dispense Refill  . acetaminophen (TYLENOL) 500 MG tablet Take 500 mg by mouth every 6 (six) hours as needed.    Marland Kitchen aspirin 81 MG tablet Take 81 mg by mouth daily.      Marland Kitchen atorvastatin (LIPITOR) 10 MG tablet TAKE 1 TABLET BY MOUTH EVERY DAY IN THE EVENING 90 tablet 0  . sildenafil (REVATIO) 20 MG tablet TAKE 3-5 TABLETS BY MOUTH DAILY AS NEEDED 50 tablet PRN   No current facility-administered medications for this visit.    Allergies:   Crestor [rosuvastatin calcium], Naproxen sodium, Penicillins, Amoxicillin, and Erythromycin    Social History:  The patient  reports that he quit smoking about 11 years ago. His smoking use included cigarettes. He has a 51.00 pack-year smoking history. He has never used smokeless tobacco. He reports current alcohol use. He reports that he does not use drugs.   Family History:  The patient's family history includes Alzheimer's disease in his father; Cancer in his mother; Diabetes in his brother and another family member; Kidney disease in an other family member; Parkinsonism in his father.    ROS:  Please see the history of present illness.   Otherwise, review of systems are positive for losing weight with portion control.   All other systems are reviewed and  negative.    PHYSICAL EXAM: VS:  BP (!) 144/74   Pulse 85   Ht 6\' 2"  (1.88 m)   Wt 248 lb 3.2 oz (112.6 kg)   SpO2 95%   BMI 31.87 kg/m  , BMI Body mass index is 31.87 kg/m. GEN: Well nourished, well developed, in no acute distress  HEENT: normal  Neck: no JVD, carotid bruits, or masses Cardiac: RRR; no murmurs, rubs, or gallops,no edema  Respiratory:  clear to auscultation bilaterally, normal work of breathing GI: soft, nontender, nondistended, + BS MS: no deformity or atrophy  Skin: warm and dry, no rash Neuro:  Strength and sensation are intact Psych: euthymic mood, full affect   EKG:   The ekg ordered today demonstrates NSR, lateral T wave inversion   Recent Labs: No results found for requested labs within last 8760 hours.  Lipid Panel    Component Value Date/Time   CHOL 178 04/10/2018 1118   TRIG 170.0 (H) 04/10/2018 1118   HDL 38.10 (L) 04/10/2018 1118   CHOLHDL 5 04/10/2018 1118   VLDL 34.0 04/10/2018 1118   LDLCALC 106 (H) 04/10/2018 1118   LDLDIRECT 162.0 03/06/2017 1719     Other studies Reviewed: Additional studies/ records that were reviewed today with results demonstrating: .   ASSESSMENT AND PLAN:  1. CHest/abdominal pain: some typical features.  Some atypical features.  Plan for CTA coronaries; can also look at aorta at the same time.  Some aortic atherosclerosis documented.  Metoprolol 100 mg before scan. 2. THoracic aortic aneurysm:  Serial f/u. 3. Hyperlipidemia: The current medical regimen is effective;  continue present plan and medications. 4. LVH: ECG changes noted.  Stable. 5. Palpitations: Controlled.    Current medicines are reviewed at length with the patient today.  The patient concerns regarding his medicines were addressed.  The following changes have been made:  No change  Labs/ tests ordered today include:  No orders of the defined types were placed in this encounter.   Recommend 150 minutes/week of aerobic exercise Low  fat, low carb, high fiber diet recommended  Disposition:   FU in 1 year or sooner if needed   Signed, Larae Grooms, MD  02/05/2020 11:00 AM    [address], [place], [place]  [address] Phone: [phone]; Fax: [phone]

## 2020-02-06 LAB — BASIC METABOLIC PANEL
BUN/Creatinine Ratio: 10 (ref 10–24)
BUN: 9 mg/dL (ref 8–27)
CO2: 24 mmol/L (ref 20–29)
Calcium: 9.9 mg/dL (ref 8.6–10.2)
Chloride: 103 mmol/L (ref 96–106)
Creatinine, Ser: 0.87 mg/dL (ref 0.76–1.27)
GFR calc Af Amer: 108 mL/min/{1.73_m2} (ref >59)
GFR calc non Af Amer: 94 mL/min/{1.73_m2} (ref >59)
Glucose: 94 mg/dL (ref 65–99)
Potassium: 4.3 mmol/L (ref 3.5–5.2)
Sodium: 140 mmol/L (ref 134–144)

## 2020-02-06 LAB — PRO B NATRIURETIC PEPTIDE: NT-Pro BNP: 37 pg/mL (ref 0–210)

## 2020-02-10 ENCOUNTER — Other Ambulatory Visit (INDEPENDENT_AMBULATORY_CARE_PROVIDER_SITE_OTHER): Payer: BC Managed Care – PPO

## 2020-02-10 ENCOUNTER — Other Ambulatory Visit: Payer: Self-pay

## 2020-02-10 DIAGNOSIS — E291 Testicular hypofunction: Secondary | ICD-10-CM

## 2020-02-10 DIAGNOSIS — E785 Hyperlipidemia, unspecified: Secondary | ICD-10-CM

## 2020-02-10 LAB — CBC WITH DIFFERENTIAL/PLATELET
Basophils Absolute: 0.1 10*3/uL (ref 0.0–0.1)
Basophils Relative: 1.3 % (ref 0.0–3.0)
Eosinophils Absolute: 0.4 10*3/uL (ref 0.0–0.7)
Eosinophils Relative: 6 % — ABNORMAL HIGH (ref 0.0–5.0)
HCT: 42.9 % (ref 39.0–52.0)
Hemoglobin: 14.7 g/dL (ref 13.0–17.0)
Lymphocytes Relative: 40.7 % (ref 12.0–46.0)
Lymphs Abs: 2.7 10*3/uL (ref 0.7–4.0)
MCHC: 34.2 g/dL (ref 30.0–36.0)
MCV: 93.1 fl (ref 78.0–100.0)
Monocytes Absolute: 0.6 10*3/uL (ref 0.1–1.0)
Monocytes Relative: 8.4 % (ref 3.0–12.0)
Neutro Abs: 2.9 10*3/uL (ref 1.4–7.7)
Neutrophils Relative %: 43.6 % (ref 43.0–77.0)
Platelets: 175 10*3/uL (ref 150.0–400.0)
RBC: 4.61 Mil/uL (ref 4.22–5.81)
RDW: 13.3 % (ref 11.5–15.5)
WBC: 6.6 10*3/uL (ref 4.0–10.5)

## 2020-02-10 LAB — COMPREHENSIVE METABOLIC PANEL
ALT: 41 U/L (ref 0–53)
AST: 43 U/L — ABNORMAL HIGH (ref 0–37)
Albumin: 4.4 g/dL (ref 3.5–5.2)
Alkaline Phosphatase: 105 U/L (ref 39–117)
BUN: 13 mg/dL (ref 6–23)
CO2: 26 mEq/L (ref 19–32)
Calcium: 9.5 mg/dL (ref 8.4–10.5)
Chloride: 105 mEq/L (ref 96–112)
Creatinine, Ser: 0.92 mg/dL (ref 0.40–1.50)
GFR: 83.75 mL/min (ref >60.00)
Glucose, Bld: 120 mg/dL — ABNORMAL HIGH (ref 70–99)
Potassium: 4.1 mEq/L (ref 3.5–5.1)
Sodium: 139 mEq/L (ref 135–145)
Total Bilirubin: 0.8 mg/dL (ref 0.2–1.2)
Total Protein: 6.9 g/dL (ref 6.0–8.3)

## 2020-02-10 LAB — LIPID PANEL
Cholesterol: 168 mg/dL (ref 0–200)
HDL: 39.5 mg/dL (ref >39.00)
LDL Cholesterol: 105 mg/dL — ABNORMAL HIGH (ref 0–99)
NonHDL: 128.47
Total CHOL/HDL Ratio: 4
Triglycerides: 119 mg/dL (ref 0.0–149.0)
VLDL: 23.8 mg/dL (ref 0.0–40.0)

## 2020-02-10 LAB — PSA: PSA: 0.31 ng/mL (ref 0.10–4.00)

## 2020-02-12 ENCOUNTER — Ambulatory Visit (INDEPENDENT_AMBULATORY_CARE_PROVIDER_SITE_OTHER): Payer: BC Managed Care – PPO | Admitting: Family Medicine

## 2020-02-12 ENCOUNTER — Other Ambulatory Visit: Payer: Self-pay

## 2020-02-12 ENCOUNTER — Encounter: Payer: Self-pay | Admitting: Family Medicine

## 2020-02-12 VITALS — BP 130/68 | HR 89 | Temp 96.9°F | Ht 72.1 in | Wt 249.1 lb

## 2020-02-12 DIAGNOSIS — Z7189 Other specified counseling: Secondary | ICD-10-CM

## 2020-02-12 DIAGNOSIS — E785 Hyperlipidemia, unspecified: Secondary | ICD-10-CM

## 2020-02-12 DIAGNOSIS — I712 Thoracic aortic aneurysm, without rupture, unspecified: Secondary | ICD-10-CM

## 2020-02-12 DIAGNOSIS — Z0000 Encounter for general adult medical examination without abnormal findings: Secondary | ICD-10-CM

## 2020-02-12 MED ORDER — ATORVASTATIN CALCIUM 10 MG PO TABS: ORAL_TABLET | ORAL | 3 refills | Status: DC

## 2020-02-12 MED ORDER — SILDENAFIL CITRATE 20 MG PO TABS: 60.0000 mg | ORAL_TABLET | Freq: Every day | ORAL | 99 refills | Status: DC | PRN

## 2020-02-12 NOTE — Progress Notes (Signed)
This visit occurred during the SARS-CoV-2 public health emergency.  Safety protocols were in place, including screening questions prior to the visit, additional usage of staff PPE, and extensive cleaning of exam room while observing appropriate contact time as indicated for disinfecting solutions.  CPE- See plan.  Routine anticipatory guidance given to patient.  See health maintenance.  The possibility exists that previously documented standard health maintenance information may have been brought forward from a previous encounter into this note.  If needed, that same information has been updated to reflect the current situation based on today's encounter.    Tetanus 2015 Flu shot yearly PNA not due.  Shingles d/w pt.   covid vaccine 2021 PSA wnl 2021 Colonoscopy 2020 Living will d/w pt. Prev done. If incapacitated, would have his wife speak for him.  Diet and exercise d/w pt. Encouraged both.  HCV and HIV prev neg. D/w pt.   Ascending aorta monitoring per Dr. Roxan Hockey.  I'll defer, pt agrees.  Pt has CTA pending, d/w pt.    Elevated Cholesterol: Using medications without problems: yes Muscle aches: see below.   Diet compliance: encouraged.   Exercise:d/w pt.   He had been taking lipitor in the AM.  D/w pt about trial in the PM.  He has some aches, unclear if causing aches.    Currently off testosterone, had seen urology about it prev.    PMH and SH reviewed Meds, vitals, and allergies reviewed.   ROS: Per HPI.  Unless specifically indicated otherwise in HPI, the patient denies:  General: fever. Eyes: acute vision changes ENT: sore throat Cardiovascular: chest pain Respiratory: SOB GI: vomiting GU: dysuria Musculoskeletal: acute back pain Derm: acute rash Neuro: acute motor dysfunction Psych: worsening mood Endocrine: polydipsia Heme: bleeding Allergy: hayfever  GEN: nad, alert and oriented HEENT: ncat NECK: supple w/o LA CV: rrr.  Systolic murmur noted, old  finding. PULM: ctab, no inc wob ABD: soft, +bs EXT: no edema SKIN: no acute rash

## 2020-02-12 NOTE — Patient Instructions (Addendum)
Try lipitor at night.  If still with aches, then stop it for 1 week.  Update me as needed, if better off med.  I'll await the results from cardiology and we'll go from there.   Check with your insurance to see if they will cover the shingrix shot. Take care.  Glad to see you.

## 2020-02-14 NOTE — Assessment & Plan Note (Signed)
Discussed options.  He is going to try taking Lipitor in the evening.  He has some aches, unclear if related to Lipitor.  He can tolerate for now.  He is going to get his CTA done and then we can see about rechecking his LFTs and A1c and lipids.  I will await update from cardiology.  Routed to Dr. Irish Lack with appreciation.

## 2020-02-14 NOTE — Assessment & Plan Note (Signed)
  Tetanus 2015 Flu shot yearly PNA not due.  Shingles d/w pt.   covid vaccine 2021 PSA wnl 2021 Colonoscopy 2020 Living will d/w pt. Prev done. If incapacitated, would have his wife speak for him.  Diet and exercise d/w pt. Encouraged both.  HCV and HIV prev neg. D/w pt.

## 2020-02-14 NOTE — Assessment & Plan Note (Signed)
Ascending aorta monitoring per Dr. Roxan Hockey.  I'll defer, pt agrees.  Pt has CTA pending, d/w pt.

## 2020-02-14 NOTE — Assessment & Plan Note (Signed)
Living will d/w pt. Prev done. If incapacitated, would have his wife speak for him.

## 2020-02-17 ENCOUNTER — Other Ambulatory Visit: Payer: BC Managed Care – PPO

## 2020-02-19 ENCOUNTER — Ambulatory Visit: Payer: BC Managed Care – PPO | Admitting: Interventional Cardiology

## 2020-03-02 ENCOUNTER — Telehealth (HOSPITAL_COMMUNITY): Payer: Self-pay | Admitting: Emergency Medicine

## 2020-03-02 NOTE — Telephone Encounter (Signed)
Reaching out to patient to offer assistance regarding upcoming cardiac imaging study; pt verbalizes understanding of appt date/time, parking situation and where to check in, pre-test NPO status and medications ordered, and verified current allergies; name and call back number provided for further questions should they arise Marchia Bond RN [facility] [phone] office [phone] cell  Asked to hold revatio , pt verbalized understanding

## 2020-03-03 ENCOUNTER — Other Ambulatory Visit: Payer: Self-pay

## 2020-03-03 ENCOUNTER — Ambulatory Visit (HOSPITAL_COMMUNITY)
Admission: RE | Admit: 2020-03-03 | Discharge: 2020-03-03 | Disposition: A | Discharge status: Home / Self Care | Payer: BC Managed Care – PPO | Source: Ambulatory Visit | Attending: Interventional Cardiology | Admitting: Interventional Cardiology

## 2020-03-03 DIAGNOSIS — R072 Precordial pain: Secondary | ICD-10-CM | POA: Diagnosis not present

## 2020-03-03 DIAGNOSIS — R918 Other nonspecific abnormal finding of lung field: Secondary | ICD-10-CM | POA: Diagnosis not present

## 2020-03-03 DIAGNOSIS — I2510 Atherosclerotic heart disease of native coronary artery without angina pectoris: Secondary | ICD-10-CM | POA: Diagnosis not present

## 2020-03-03 DIAGNOSIS — I712 Thoracic aortic aneurysm, without rupture: Secondary | ICD-10-CM | POA: Diagnosis not present

## 2020-03-03 DIAGNOSIS — I700 Atherosclerosis of aorta: Secondary | ICD-10-CM | POA: Diagnosis not present

## 2020-03-03 MED ORDER — NITROGLYCERIN 0.4 MG SL SUBL
0.8000 mg | SUBLINGUAL_TABLET | Freq: Once | SUBLINGUAL | Status: AC
  Administered 2020-03-03: 0.8 mg via SUBLINGUAL

## 2020-03-03 MED ORDER — IOHEXOL 350 MG/ML SOLN
80.0000 mL | Freq: Once | INTRAVENOUS | Status: AC | PRN
  Administered 2020-03-03: 80 mL via INTRAVENOUS

## 2020-03-03 MED ORDER — NITROGLYCERIN 0.4 MG SL SUBL
SUBLINGUAL_TABLET | SUBLINGUAL | Status: AC
  Filled 2020-03-03: qty 2

## 2020-04-02 ENCOUNTER — Ambulatory Visit (INDEPENDENT_AMBULATORY_CARE_PROVIDER_SITE_OTHER): Payer: BC Managed Care – PPO

## 2020-04-02 ENCOUNTER — Other Ambulatory Visit: Payer: Self-pay

## 2020-04-02 DIAGNOSIS — Z23 Encounter for immunization: Secondary | ICD-10-CM | POA: Diagnosis not present

## 2020-05-06 ENCOUNTER — Other Ambulatory Visit: Payer: Self-pay | Admitting: Thoracic Surgery (Cardiothoracic Vascular Surgery)

## 2020-05-06 DIAGNOSIS — I712 Thoracic aortic aneurysm, without rupture, unspecified: Secondary | ICD-10-CM

## 2020-05-22 ENCOUNTER — Other Ambulatory Visit: Payer: Self-pay | Admitting: Family Medicine

## 2020-05-22 DIAGNOSIS — R739 Hyperglycemia, unspecified: Secondary | ICD-10-CM

## 2020-05-22 DIAGNOSIS — E785 Hyperlipidemia, unspecified: Secondary | ICD-10-CM

## 2020-05-22 NOTE — Progress Notes (Signed)
Please check with patient.  I realize he has upcoming imaging pending.  Can he come in for fasting repeat labs in the meantime?  I put in the follow-up lab orders.  Thanks.

## 2020-05-23 ENCOUNTER — Other Ambulatory Visit: Payer: BC Managed Care – PPO

## 2020-05-23 NOTE — Progress Notes (Signed)
Patient scheduled.

## 2020-05-25 ENCOUNTER — Other Ambulatory Visit: Payer: Self-pay | Admitting: Family Medicine

## 2020-05-25 ENCOUNTER — Other Ambulatory Visit: Payer: Self-pay

## 2020-05-25 ENCOUNTER — Other Ambulatory Visit (INDEPENDENT_AMBULATORY_CARE_PROVIDER_SITE_OTHER): Payer: BC Managed Care – PPO

## 2020-05-25 DIAGNOSIS — R739 Hyperglycemia, unspecified: Secondary | ICD-10-CM

## 2020-05-25 DIAGNOSIS — E785 Hyperlipidemia, unspecified: Secondary | ICD-10-CM | POA: Diagnosis not present

## 2020-05-25 LAB — COMPREHENSIVE METABOLIC PANEL
ALT: 50 U/L (ref 0–53)
AST: 59 U/L — ABNORMAL HIGH (ref 0–37)
Albumin: 4.3 g/dL (ref 3.5–5.2)
Alkaline Phosphatase: 96 U/L (ref 39–117)
BUN: 10 mg/dL (ref 6–23)
CO2: 28 mEq/L (ref 19–32)
Calcium: 9.3 mg/dL (ref 8.4–10.5)
Chloride: 105 mEq/L (ref 96–112)
Creatinine, Ser: 0.9 mg/dL (ref 0.40–1.50)
GFR: 92.59 mL/min (ref >60.00)
Glucose, Bld: 111 mg/dL — ABNORMAL HIGH (ref 70–99)
Potassium: 4.3 mEq/L (ref 3.5–5.1)
Sodium: 140 mEq/L (ref 135–145)
Total Bilirubin: 0.6 mg/dL (ref 0.2–1.2)
Total Protein: 7.4 g/dL (ref 6.0–8.3)

## 2020-05-25 LAB — HEMOGLOBIN A1C: Hgb A1c MFr Bld: 5.9 % (ref 4.6–6.5)

## 2020-05-25 LAB — LIPID PANEL
Cholesterol: 196 mg/dL (ref 0–200)
HDL: 39.4 mg/dL (ref >39.00)
LDL Cholesterol: 128 mg/dL — ABNORMAL HIGH (ref 0–99)
NonHDL: 156.35
Total CHOL/HDL Ratio: 5
Triglycerides: 141 mg/dL (ref 0.0–149.0)
VLDL: 28.2 mg/dL (ref 0.0–40.0)

## 2020-05-25 MED ORDER — ATORVASTATIN CALCIUM 10 MG PO TABS: 20.0000 mg | ORAL_TABLET | Freq: Every evening | ORAL | Status: DC

## 2020-06-01 ENCOUNTER — Other Ambulatory Visit: Payer: Self-pay

## 2020-06-01 ENCOUNTER — Ambulatory Visit (HOSPITAL_COMMUNITY): Payer: BC Managed Care – PPO | Attending: Internal Medicine

## 2020-06-01 DIAGNOSIS — I712 Thoracic aortic aneurysm, without rupture, unspecified: Secondary | ICD-10-CM

## 2020-06-01 LAB — ECHOCARDIOGRAM COMPLETE
AR max vel: 1.87 cm2
AV Area VTI: 1.89 cm2
AV Area mean vel: 1.71 cm2
AV Mean grad: 13 mmHg
AV Peak grad: 22.5 mmHg
Ao pk vel: 2.37 m/s
Area-P 1/2: 3.1 cm2
P 1/2 time: 539 msec
S' Lateral: 2.8 cm

## 2020-06-07 ENCOUNTER — Other Ambulatory Visit: Payer: Self-pay

## 2020-06-07 ENCOUNTER — Encounter: Payer: Self-pay | Admitting: Thoracic Surgery (Cardiothoracic Vascular Surgery)

## 2020-06-07 ENCOUNTER — Ambulatory Visit (INDEPENDENT_AMBULATORY_CARE_PROVIDER_SITE_OTHER): Payer: BC Managed Care – PPO | Admitting: Thoracic Surgery (Cardiothoracic Vascular Surgery)

## 2020-06-07 VITALS — BP 146/76 | HR 90 | Temp 97.9°F | Resp 20 | Ht 74.0 in | Wt 253.0 lb

## 2020-06-07 DIAGNOSIS — I712 Thoracic aortic aneurysm, without rupture, unspecified: Secondary | ICD-10-CM

## 2020-06-07 DIAGNOSIS — I350 Nonrheumatic aortic (valve) stenosis: Secondary | ICD-10-CM | POA: Insufficient documentation

## 2020-06-07 NOTE — Progress Notes (Signed)
Steven [name],[place] [address]             [phone]    HPI: Steven [name] returns for follow-up of his ascending aneurysm  Steven [name] is a 59 year old man with a past history of thoracic aortic atherosclerosis, ascending thoracic aneurysm, mild to moderate AI, mild AS, nephrolithiasis, and a positive PPD.  He has a remote history of tobacco abuse with 51-pack-year history prior to quitting in 2010.  He was first found to have an ascending aneurysm in 2018.  It was noted on a low-dose CT done for lung cancer screening.  He has been followed since that time.  Last saw him in June 2021.  His aneurysm was stable.  In the interim since his last visit he was having some problems with left calf pain.  He has an old injury to that area.  He also was having some atypical chest and abdominal pain.  In September he had a coronary CT and CT angio of the chest.  The aneurysm is essentially unchanged.  More recently, he had an echocardiogram last week.  He still has some calf pain.  He denies any chest pain, pressure, or tightness. Past Medical History:  Diagnosis Date  . Allergy   . At risk for sleep apnea    STOP-BANG= 4     SENT TO PCP 04-15-2014  . BRBPR (bright red blood per rectum) 07/14/2015  . ED (erectile dysfunction) 03/10/2013  . Epididymal cyst    LEFT  . GERD (gastroesophageal reflux disease)   . Heart murmur mild mvp  -- asymptomatic   per pt echo 2005  . 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 Hx of adenomatous colonic polyps 04/01/2017  . Hyperglycemia 03/10/2013  . Hyperlipidemia   . Hypogonadism male    Previously evaluated by Dr. Reece Agar with URO for benign noduarity in left hemiscrotum (2008) felt to be variant of normal.  . Left nephrolithiasis   . Testicular pain 12/01/2010   Per Dr. Gaynelle Arabian, thought to be due to epididymal cysts   . Thoracic aortic aneurysm ([place]) 04/18/2017  . VARICOCELE 02/17/2010    Qualifier: Diagnosis of  By: Fuller Plan CMA (AAMA), Steven [name]      Current Outpatient Medications  Medication Sig Dispense Refill  . acetaminophen (TYLENOL) 500 MG tablet Take 500 mg by mouth every 6 (six) hours as needed.    Marland Kitchen aspirin 81 MG tablet Take 81 mg by mouth daily.    Marland Kitchen atorvastatin (LIPITOR) 10 MG tablet Take 2 tablets (20 mg total) by mouth every evening.    . metoprolol tartrate (LOPRESSOR) 100 MG tablet Take 1 tablet by mouth 2 hours prior to Cardiac CT 1 tablet 0  . sildenafil (REVATIO) 20 MG tablet Take 3-5 tablets (60-100 mg total) by mouth daily as needed. 50 tablet PRN   No current facility-administered medications for this visit.    Physical Exam BP (!) 146/76 (BP Location: Left Arm, Patient Position: Sitting, Cuff Size: Large)   Pulse 90   Temp 97.9 F (36.6 C) (Skin)   Resp 20   Ht 6\' 2"  (1.88 m)   Wt 253 lb (114.8 kg)   SpO2 97% Comment: RA  BMI 32.55 kg/m  59 year old man in no acute distress Alert and oriented x3 with no focal deficits Lungs clear with equal breath sounds bilaterally Cardiac regular  rate and rhythm with a 2/6 systolic murmur No peripheral edema  Diagnostic Tests: Echocardiogram 06/01/2020 IMPRESSIONS    1. Left ventricular ejection fraction, by estimation, is 65 to 70%. The  left ventricle has normal function. The left ventricle has no regional  wall motion abnormalities. There is moderate concentric left ventricular  hypertrophy. Left ventricular  diastolic parameters were normal. The average left ventricular global  longitudinal strain is -18.7 %. The global longitudinal strain is normal.  2. Right ventricular systolic function is normal. The right ventricular  size is normal. There is normal pulmonary artery systolic pressure. The  estimated right ventricular systolic pressure is 66.0 mmHg.  3. The mitral valve is normal in structure. Mild mitral valve  regurgitation. No evidence of mitral stenosis.  4. The aortic valve is  tricuspid. There is moderate calcification of the  aortic valve. There is moderate thickening of the aortic valve. Aortic  valve regurgitation is mild. Mild aortic valve stenosis. Aortic valve mean  gradient measures 13.0 mmHg.  5. Aortic dilatation noted. There is moderate dilatation of the ascending  aorta, measuring 46 mm.  6. The inferior vena cava is normal in size with greater than 50%  respiratory variability, suggesting right atrial pressure of 3 mmHg.   CT ANGIOGRAPHY CHEST WITH CONTRAST  TECHNIQUE: Multidetector CT imaging of the chest was performed using the standard protocol during bolus administration of intravenous contrast. Multiplanar CT image reconstructions and MIPs were obtained to evaluate the vascular anatomy.  CONTRAST:  53mL OMNIPAQUE IOHEXOL 350 MG/ML SOLN  COMPARISON:  Chest CTA 12/02/2019.  FINDINGS: Cardiovascular: Heart size is normal. There is no significant pericardial fluid, thickening or pericardial calcification. There is aortic atherosclerosis, as well as atherosclerosis of the great vessels of the mediastinum and the coronary arteries, including calcified atherosclerotic plaque in the left anterior descending coronary arteries. Aneurysmal dilatation of the ascending thoracic aorta measuring 4.7 x 4.8 cm. Mid aortic arch measures 2.8 cm in diameter. Descending thoracic aorta measures 3.0 cm in diameter. No evidence of thoracic aortic dissection.  Mediastinum/Nodes: No pathologically enlarged mediastinal or hilar lymph nodes. Esophagus is unremarkable in appearance. No axillary lymphadenopathy.  Lungs/Pleura: Multiple tiny 2-3 mm pulmonary nodules, stable compared to prior studies dating back to 05/20/2018, considered definitively benign. No other larger more suspicious appearing pulmonary nodules or masses are noted in the visualized portions of the lungs (extreme lung apices and lung bases are incompletely imaged on today's  examination). No acute consolidative airspace disease. No pleural effusions.  Upper Abdomen: Unremarkable.  Musculoskeletal: There are no aggressive appearing lytic or blastic lesions noted in the visualized portions of the skeleton.  Review of the MIP images confirms the above findings.  IMPRESSION: 1. Aortic atherosclerosis, with persistent mild aneurysmal dilatation of the ascending thoracic aorta which measures 4.7 x 4.8 cm on today's examination. 2. Tiny pulmonary nodules in the lungs bilaterally, stable compared to prior studies, considered definitively benign.  Aortic Atherosclerosis (ICD10-I70.0). Aortic aneurysm NOS (ICD10-I71.9).   Electronically Signed   By: Vinnie Langton M.D.   On: 03/03/2020 08:35 I personally reviewed the CT images and concur with the findings noted above.  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   Ascending thoracic aneurysm/thoracic aortic atherosclerosis-aneurysm stable about 4.8 cm.  Needs continued semiannual follow-up.  No history of hypertension his blood pressure was mildly elevated today at  146/76.  He is going to check it at home and see if it remains elevated.  He will follow up with Dr. Damita Dunnings.  Aortic valve stenosis and insufficiency-both mild on recent echo.  Normal left-ventricular function.   Plan: Follow-up with Dr. Damita Dunnings regarding blood pressure Return in 6 months with CT angiogram chest  [name], MD [facility] [phone]

## 2020-07-08 ENCOUNTER — Encounter: Payer: Self-pay | Admitting: Family Medicine

## 2020-07-08 ENCOUNTER — Other Ambulatory Visit: Payer: Self-pay

## 2020-07-08 ENCOUNTER — Ambulatory Visit (INDEPENDENT_AMBULATORY_CARE_PROVIDER_SITE_OTHER): Payer: BC Managed Care – PPO | Admitting: Family Medicine

## 2020-07-08 DIAGNOSIS — E785 Hyperlipidemia, unspecified: Secondary | ICD-10-CM

## 2020-07-08 DIAGNOSIS — L723 Sebaceous cyst: Secondary | ICD-10-CM | POA: Diagnosis not present

## 2020-07-08 MED ORDER — ATORVASTATIN CALCIUM 10 MG PO TABS: 10.0000 mg | ORAL_TABLET | Freq: Every day | ORAL | Status: DC

## 2020-07-08 NOTE — Patient Instructions (Signed)
Pull the packing early Sunday AM.  Update me as needed.  It will likely continue to drain.  Wash with soap and water after the packing is out and keep covered as needed.  Take care.  Glad to see you.

## 2020-07-08 NOTE — Progress Notes (Signed)
This visit occurred during the SARS-CoV-2 public health emergency.  Safety protocols were in place, including screening questions prior to the visit, additional usage of staff PPE, and extensive cleaning of exam room while observing appropriate contact time as indicated for disinfecting solutions.  He tried lipitor 20mg  but couldn't tolerate it.  Insomnia on med, worse at 20mg .  He is able to tolerate 10mg  with some aches.  I will update cardiology, Dr. Irish Lack.   I&D  Meds, vitals, and allergies reviewed.   Indication: Tender/irritated/inflamed sebaceous cyst.  Pt complaints of: pain, swelling  Location: Upper mid back.  Size: Approximately 3 cm  Informed consent obtained.  Pt aware of risks not limited to but including infection, bleeding, damage to near by organs.  Prep: etoh/betadine  Anesthesia: 1%lidocaine with epi, good effect  Incision made with #11 blade  Wound explored and loculations removed, purulent material expressed.  Wound packed with iodoform gauze  Tolerated well

## 2020-07-11 NOTE — Assessment & Plan Note (Signed)
He clearly felt better after incision and drainage. Lesion pack per routine, successfully done without complication.  Routine postprocedure instructions d/w pt- remove packing in 48h, keep area clean and bandaged, follow up if concerns/spreading erythema/pain.

## 2020-07-11 NOTE — Assessment & Plan Note (Signed)
He tried lipitor 20mg  but couldn't tolerate it.  Insomnia on med, worse at 20mg .  He is able to tolerate 10mg  with some aches.  I need and greatly appreciate input from Dr. Irish Lack at this point.

## 2020-08-15 ENCOUNTER — Other Ambulatory Visit: Payer: Self-pay

## 2020-08-15 ENCOUNTER — Telehealth: Payer: Self-pay

## 2020-08-15 ENCOUNTER — Encounter: Payer: Self-pay | Admitting: Internal Medicine

## 2020-08-15 ENCOUNTER — Ambulatory Visit (INDEPENDENT_AMBULATORY_CARE_PROVIDER_SITE_OTHER): Payer: BC Managed Care – PPO | Admitting: Internal Medicine

## 2020-08-15 DIAGNOSIS — L089 Local infection of the skin and subcutaneous tissue, unspecified: Secondary | ICD-10-CM

## 2020-08-15 DIAGNOSIS — L729 Follicular cyst of the skin and subcutaneous tissue, unspecified: Secondary | ICD-10-CM

## 2020-08-15 MED ORDER — DOXYCYCLINE HYCLATE 100 MG PO TABS: 100.0000 mg | ORAL_TABLET | Freq: Two times a day (BID) | ORAL | 1 refills | Status: DC

## 2020-08-15 NOTE — Telephone Encounter (Signed)
Patient has an appointment with Dr. Silvio Pate today at 2:30.

## 2020-08-15 NOTE — Assessment & Plan Note (Signed)
Recurred after an I&D last month Looks better after drainage last night Will give doxy x 5 days Will refer to surgeon for excision if recurs

## 2020-08-15 NOTE — Progress Notes (Signed)
Subjective:    Patient ID: Steven [name], male    DOB: Nov 20, 1960, 60 y.o.   MRN: [id number]  HPI Here for trouble with cyst again--had I&D a bit over a month ago This visit occurred during the SARS-CoV-2 public health emergency.  Safety protocols were in place, including screening questions prior to the visit, additional usage of staff PPE, and extensive cleaning of exam room while observing appropriate contact time as indicated for disinfecting solutions.   It did do well after the procedure Packing stayed in for 3-4 days Kept it clean Healed over nice  About a week ago--noticed it getting bigger again Wife noticed 2-3 days ago--looking red Got much bigger yesterday---wife was manipulating it and it drained Dark red blood and slimy green stuff came out Better today  Current Outpatient Medications on File Prior to Visit  Medication Sig Dispense Refill  . acetaminophen (TYLENOL) 500 MG tablet Take 500 mg by mouth every 6 (six) hours as needed.    Marland Kitchen aspirin 81 MG tablet Take 81 mg by mouth daily.    Marland Kitchen atorvastatin (LIPITOR) 10 MG tablet Take 1 tablet (10 mg total) by mouth daily.    . sildenafil (REVATIO) 20 MG tablet Take 3-5 tablets (60-100 mg total) by mouth daily as needed. 50 tablet PRN   No current facility-administered medications on file prior to visit.    Allergies  Allergen Reactions  . Crestor [Rosuvastatin Calcium] Other (See Comments)    Irritable.    . Naproxen Sodium Swelling  . Penicillins Swelling  . Amoxicillin Rash  . Erythromycin Rash    Past Medical History:  Diagnosis Date  . Allergy   . At risk for sleep apnea    STOP-BANG= 4     SENT TO PCP 04-15-2014  . BRBPR (bright red blood per rectum) 07/14/2015  . ED (erectile dysfunction) 03/10/2013  . Epididymal cyst    LEFT  . GERD (gastroesophageal reflux disease)   . Heart murmur mild mvp  -- asymptomatic   per pt echo 2005  . 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  Hx of adenomatous colonic polyps 04/01/2017  . Hyperglycemia 03/10/2013  . Hyperlipidemia   . Hypogonadism male    Previously evaluated by Dr. Reece Agar with URO for benign noduarity in left hemiscrotum (2008) felt to be variant of normal.  . Left nephrolithiasis   . Testicular pain 12/01/2010   Per Dr. Gaynelle Arabian, thought to be due to epididymal cysts   . Thoracic aortic aneurysm ([place]) 04/18/2017  . 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 COLONOSCOPY    . COLONOSCOPY WITH PROPOFOL  01-06-2014   POLYPECTOMY  . CYSTOSCOPY WITH RETROGRADE PYELOGRAM, URETEROSCOPY AND STENT PLACEMENT Left 04/19/2014   Procedure: CYSTOSCOPY WITH RETROGRADE PYELOGRAM with interpretation, URETEROSCOPY, stone extraction with basket, AND STENT PLACEMENT, implantation of backstop;  Surgeon: Ailene Rud, MD;  Location: [facility];  Service: Urology;  Laterality: Left;  . EXCISION SEBACEOUS CYST X3 OF BACK  03-13-2010  . HOLMIUM LASER APPLICATION Left 34/19/3790   Procedure: HOLMIUM LASER APPLICATION;  Surgeon: Ailene Rud, MD;  Location: [facility];  Service: Urology;  Laterality: Left;  . NASAL SEPTUM SURGERY  09-21-2002  . POLYPECTOMY    . [place]  History  Problem Relation Age of Onset  . Cancer Mother        Renal CA, treated 1993  . Parkinsonism Father   . Alzheimer's disease Father   . Diabetes Brother   . Diabetes Other   . Kidney disease Other   . Colon cancer Neg Hx   . Prostate cancer Neg Hx   . Esophageal cancer Neg Hx   . Rectal cancer Neg Hx   . Stomach cancer Neg Hx   . Pancreatic cancer Neg Hx   . Colon polyps Neg Hx     Social History   Socioeconomic History  . Marital status: Married    Spouse name: Not on file  . Number of children: 3  . Years of education: Not on file  . Highest education level: Not on file   Occupational History  . 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 Smoking status: Former Smoker    Packs/day: 1.50    Years: 34.00    Pack years: 51.00    Types: Cigarettes    Quit date: 10/29/2008    Years since quitting: 11.8  . Smokeless tobacco: Never Used  Vaping Use  . Vaping Use: Never used  Substance and Sexual Activity  . Alcohol use: Yes    Alcohol/week: 0.0 standard drinks    Comment: Rare  . Drug use: No  . Sexual activity: Not on file  Other Topics Concern  . Not on file  Social History Narrative   Married since 1988, second marriage   3 children   1 daughter alive -POTTS - monitors salt intake.     2 step-daughters (one diagnosed with Crohn's Disease.)   Regular exercise:  Yes   Social Determinants of Health   Financial Resource Strain: Not on file  Food Insecurity: Not on file  Transportation Needs: Not on file  Physical Activity: Not on file  Stress: Not on file  Social Connections: Not on file  Intimate Partner Violence: Not on file   Review of Systems No fever Feels fine    Objective:   Physical Exam Constitutional:      Appearance: Normal appearance.  Skin:    Comments: Small open area in midline of mid thoracic back Not elevated Slight discharge of bloody fluid with expressing it Slight redness  Neurological:     Mental Status: He is alert.            Assessment & Plan:

## 2020-08-15 NOTE — Telephone Encounter (Signed)
[facility] - Client TELEPHONE ADVICE RECORD [facility] Patient Name: Steven [name] Gender: Male DOB: Jan 01, 1961 Age: 60 Y 1 M 4 D Return Phone Number: [phone] (Primary), [phone] (Secondary) Address[place] [address] Client [facility] - Client Client Site [place] Physician Renford Dills - MD Contact Type Call Who Is Calling Patient / Member / Family / Caregiver Call Type Triage / Clinical Caller Name Fabio Asa. Relationship To Patient Provider Return Phone Number [phone] (Primary) Chief Complaint Wound Infection Reason for Call Symptomatic / Request for Health Information Initial Comment Caller states that she has a patient of Dr. Damita Dunnings. Caller states that patient has a sore in the spot where a cyst was removed. Caller states that it has a lot of drainage. Caller states that patient got a shot glass full of green pus out of it yesterday. 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 Nurse Raquel Sarna. Translation No Nurse Assessment Nurse: D'Heur Lucia Gaskins, RN, Adrienne Date/Time (Eastern Time): 08/15/2020 8:47:00 AM Confirm and document reason for call. If symptomatic, describe symptoms. ---Caller (nurse from [place]) states that she has a patient of Dr. Josefine Class who has a sore in the spot where a cyst was removed about a month ago. Yesterday patient got a shot glass full of green pus out of it. It began getting puffy last week. It is still draining. Cyst is in the mid back below the shoulder blade more toward left side. Does the patient have any new or worsening symptoms? ---Yes Will a triage be completed? ---Yes Related visit to physician within the last 2 weeks? ---No Does the PT have any chronic conditions? (i.e. diabetes, asthma, this includes High risk factors for pregnancy, etc.) ---Yes List chronic conditions. ---high  cholesterol, aortic aneurysm Is this a behavioral health or substance abuse call? ---No Guidelines Guideline Title Affirmed Question Affirmed Notes Nurse Date/Time (Eastern Time) Wound Infection [1] Red area or streak AND [2] no fever D'Heur Lucia Gaskins, RN, Adrienne 08/15/2020 8:51:56 AM Disp. Time Eilene Ghazi Time) Disposition Final User PLEASE NOTE: All timestamps contained within this report are represented as [place] Standard Time. CONFIDENTIALTY NOTICE: This fax transmission is intended only for the addressee. It contains information that is legally privileged, confidential or otherwise protected from use or disclosure. If you are not the intended recipient, you are strictly prohibited from reviewing, disclosing, copying using or disseminating any of this information or taking any action in reliance on or regarding this information. If you have received this fax in error, please notify us immediately by telephone so that we can arrange for its return to [place]. Phone: [phone], Toll-Free: [phone], Fax: [phone] Page: 2 of 2 Call Id: [id number] 08/15/2020 8:56:54 AM See PCP within 24 Hours Yes [place], RN, Ebbie Latus Disagree/Comply Comply Caller Understands Yes PreDisposition Call Doctor Care Advice Given Per Guideline SEE PCP WITHIN 24 HOURS: * IF OFFICE WILL BE OPEN: You need to be examined within the next 24 hours. Call your doctor (or NP/PA) when the office opens and make an appointment. ANTIBIOTIC OINTMENT: WARM SOAKS OR LOCAL HEAT: CALL BACK IF: * Fever occurs * You become worse CARE ADVICE per Wound Infection (Adult) guideline. Comments User: Vincente Liberty, D'Heur Lucia Gaskins, RN Date/Time Eilene Ghazi Time): 08/15/2020 8:58:25 AM Warm transferred to office for appointment. Referrals REFERRED TO PCP OFFICE

## 2020-08-16 ENCOUNTER — Ambulatory Visit: Payer: BC Managed Care – PPO | Admitting: Primary Care

## 2020-08-17 NOTE — Telephone Encounter (Signed)
Noted. Thanks.  Please get update on patient today.

## 2020-08-18 NOTE — Telephone Encounter (Signed)
Patient stated that since he saw Dr. Silvio Pate the other day and starting antibiotics the wound is doing much better. Patient stated that the drainage has decreased. Patient denies fever, redness, swelling or pain.

## 2020-08-19 NOTE — Telephone Encounter (Signed)
Noted.  Thanks.  Glad to hear.  ? ?

## 2020-10-04 ENCOUNTER — Other Ambulatory Visit: Payer: Self-pay | Admitting: Thoracic Surgery (Cardiothoracic Vascular Surgery)

## 2020-10-04 DIAGNOSIS — I712 Thoracic aortic aneurysm, without rupture, unspecified: Secondary | ICD-10-CM

## 2020-11-14 ENCOUNTER — Ambulatory Visit
Admission: RE | Admit: 2020-11-14 | Discharge: 2020-11-14 | Disposition: A | Discharge status: Home / Self Care | Payer: BC Managed Care – PPO | Source: Ambulatory Visit | Attending: Thoracic Surgery (Cardiothoracic Vascular Surgery) | Admitting: Thoracic Surgery (Cardiothoracic Vascular Surgery)

## 2020-11-14 DIAGNOSIS — R918 Other nonspecific abnormal finding of lung field: Secondary | ICD-10-CM | POA: Diagnosis not present

## 2020-11-14 DIAGNOSIS — I712 Thoracic aortic aneurysm, without rupture, unspecified: Secondary | ICD-10-CM

## 2020-11-14 DIAGNOSIS — I2510 Atherosclerotic heart disease of native coronary artery without angina pectoris: Secondary | ICD-10-CM | POA: Diagnosis not present

## 2020-11-14 DIAGNOSIS — I700 Atherosclerosis of aorta: Secondary | ICD-10-CM | POA: Diagnosis not present

## 2020-11-14 MED ORDER — IOPAMIDOL (ISOVUE-370) INJECTION 76%
75.0000 mL | Freq: Once | INTRAVENOUS | Status: AC | PRN
  Administered 2020-11-14: 75 mL via INTRAVENOUS

## 2020-11-25 ENCOUNTER — Other Ambulatory Visit: Payer: BC Managed Care – PPO

## 2020-11-29 ENCOUNTER — Ambulatory Visit (INDEPENDENT_AMBULATORY_CARE_PROVIDER_SITE_OTHER): Payer: BC Managed Care – PPO | Admitting: Thoracic Surgery (Cardiothoracic Vascular Surgery)

## 2020-11-29 ENCOUNTER — Encounter: Payer: Self-pay | Admitting: Thoracic Surgery (Cardiothoracic Vascular Surgery)

## 2020-11-29 ENCOUNTER — Other Ambulatory Visit: Payer: Self-pay

## 2020-11-29 ENCOUNTER — Other Ambulatory Visit: Payer: BC Managed Care – PPO

## 2020-11-29 VITALS — BP 120/77 | HR 72 | Resp 20 | Ht 74.0 in | Wt 250.0 lb

## 2020-11-29 DIAGNOSIS — I712 Thoracic aortic aneurysm, without rupture, unspecified: Secondary | ICD-10-CM

## 2020-11-29 NOTE — Progress Notes (Signed)
[address]       [place] [address]             [phone]     HPI: Steven [name] returns for follow-up of his ascending aneurysm  Steven [name] is a 60 year old man with a history of thoracic aortic atherosclerosis, ascending thoracic aneurysm, mild to moderate aortic insufficiency, mild aortic stenosis, nephrolithiasis, and a history of a positive PPD.  He had a 50-pack-year history of smoking prior to quitting in 2010.  He had a low-dose screening CT for lung cancer in 2018.  He was found to have an ascending aneurysm.  We have been following him since then.  Last saw him in December 2021.  At that time the aneurysm was unchanged at about 4.7 to 4.8 cm.  An echocardiogram showed mild aortic stenosis and insufficiency.  He had normal LV function.  He had a CT for coronary calcium screening in September 2021.  It showed no significant CAD.  In the interim since his last visit he has been feeling well.  He says that he is tired a lot of the time.  He sometimes experiences some mild chest tightness when he first starts to walk but it resolves and he is not having any problems after that.  Past Medical History:  Diagnosis Date  . Allergy   . At risk for sleep apnea    STOP-BANG= 4     SENT TO PCP 04-15-2014  . BRBPR (bright red blood per rectum) 07/14/2015  . ED (erectile dysfunction) 03/10/2013  . Epididymal cyst    LEFT  . GERD (gastroesophageal reflux disease)   . Heart murmur mild mvp  -- asymptomatic   per pt echo 2005  . 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 Hx of adenomatous colonic polyps 04/01/2017  . Hyperglycemia 03/10/2013  . Hyperlipidemia   . Hypogonadism male    Previously evaluated by Dr. Reece Agar with [facility] for benign noduarity in left hemiscrotum (2008) felt to be variant of normal.  . Left nephrolithiasis   . Testicular pain 12/01/2010   Per Dr. Gaynelle Arabian, thought to be due to epididymal cysts   . Thoracic aortic  aneurysm ([place]) 04/18/2017  . VARICOCELE 02/17/2010   Qualifier: Diagnosis of  By: Fuller Plan CMA (AAMA), [name]      Current Outpatient Medications  Medication Sig Dispense Refill  . acetaminophen (TYLENOL) 500 MG tablet Take 500 mg by mouth every 6 (six) hours as needed.    Marland Kitchen aspirin 81 MG tablet Take 81 mg by mouth daily.    Marland Kitchen atorvastatin (LIPITOR) 10 MG tablet Take 1 tablet (10 mg total) by mouth daily.    Marland Kitchen doxycycline (VIBRA-TABS) 100 MG tablet Take 1 tablet (100 mg total) by mouth 2 (two) times daily. 10 tablet 1  . sildenafil (REVATIO) 20 MG tablet Take 3-5 tablets (60-100 mg total) by mouth daily as needed. 50 tablet PRN   No current facility-administered medications for this visit.    Physical Exam BP 120/77 (BP Location: Right Arm, Patient Position: Sitting)   Pulse 72   Resp 20   Ht 6\' 2"  (1.88 m)   Wt 250 lb (113.4 kg)   SpO2 97% Comment: RA  BMI 32.73 kg/m  60 year old man in no acute distress Alert and oriented x3 with no focal deficits Well-developed well-nourished No carotid bruits Cardiac regular rate and rhythm with a 2/6  systolic murmur Lungs clear equal breath sounds bilaterally No peripheral edema  Diagnostic Tests: CT ANGIOGRAPHY CHEST WITH CONTRAST  TECHNIQUE: Multidetector CT imaging of the chest was performed using the standard protocol during bolus administration of intravenous contrast. Multiplanar CT image reconstructions and MIPs were obtained to evaluate the vascular anatomy.  CONTRAST:  71mL ISOVUE-370 IOPAMIDOL (ISOVUE-370) INJECTION 76%  COMPARISON:  Septum ninth of 2021.  FINDINGS: Cardiovascular: Dilated ascending thoracic aorta measured in transverse dimension at 4.7 x 4.8 cm without acute process. Scattered atheromatous plaque in the thoracic aorta is similar to prior exams. Calcification of the aortic valve as on the previous study. Normal heart size. No substantial pericardial effusion. Scattered coronary artery  calcifications. Central pulmonary vasculature is of normal caliber.  Mediastinum/Nodes: Thoracic inlet structures are normal.  No axillary lymphadenopathy. No mediastinal lymphadenopathy. No hilar lymphadenopathy. Esophagus grossly normal.  Lungs/Pleura: No effusion. No consolidative changes. Airways are patent. Tiny pulmonary nodule in the anterior RIGHT middle lobe (image 112/6) 3 mm. Unchanged since December of 2020. Other tiny pulmonary nodules also without change in the RIGHT middle lobe.  Upper Abdomen: Incidental imaging of upper abdominal contents with unremarkable appearance of imaged portions of liver, gallbladder, pancreas, spleen, adrenal glands kidneys. No acute gastrointestinal abnormality to the extent evaluated.  Musculoskeletal: No acute bone finding. No destructive bone process.  Review of the MIP images confirms the above findings.  IMPRESSION: 1. Dilated ascending thoracic aorta measured in transverse dimension at 4.7 x 4.8 cm without acute process. Not changed from previous imaging. Ascending thoracic aortic aneurysm. Recommend semi-annual imaging followup by CTA or MRA and referral to cardiothoracic surgery if not already obtained. This recommendation follows 2010 ACCF/AHA/AATS/ACR/ASA/SCA/SCAI/SIR/STS/SVM Guidelines for the Diagnosis and Management of Patients With Thoracic Aortic Disease. Circulation. 2010; 121: M094-B096. Aortic aneurysm NOS (ICD10-I71.9) 2. Calcification of the aortic valve. Correlate with any evidence of valvular dysfunction or stenosis. 3. Tiny pulmonary nodules in the RIGHT middle lobe, stable since December of 2020. Considered benign. 4. Aortic atherosclerosis.  Aortic Atherosclerosis (ICD10-I70.0).   Electronically Signed   By: Zetta Bills M.D.   On: 11/14/2020 12:12 I personally reviewed the CT images.  There is a 4.8 cm ascending aneurysm, unchanged.  Small subpleural right middle lobe nodules unchanged dating  back to 2020.  Impression: Steven [name] is a 60 year old man with a history of thoracic aortic atherosclerosis, ascending thoracic aneurysm, mild to moderate aortic insufficiency, mild aortic stenosis, nephrolithiasis, and a history of a positive PPD.  He has noted occasional episodes of tightness in his chest when he first starts to walk.  That resolves within seconds.  No significant CAD by CT in September.  Only mild AS.  If that were to worsen he should contact one of his physicians for further evaluation.  Ascending aneurysm-stable at 4.8 cm.  Needs continued semiannual follow-up.  Importance of blood pressure control emphasized.  No history of hypertension.  Blood pressure was elevated at last visit but is normal today.  Aortic stenosis and insufficiency-mild on his most recent echo 6 months ago.  Followed by cardiology.  Right middle lobe lung nodules-stable for over 2 years.  History of tobacco abuse-meets criteria for low-dose screening but will be having scans for his aneurysm anyway so that will take the place of the screening CTs.  Plan: Return in 6 months with CT of chest  [name], MD [facility] [phone]

## 2020-11-30 ENCOUNTER — Telehealth: Payer: Self-pay | Admitting: Family Medicine

## 2020-11-30 NOTE — Telephone Encounter (Signed)
Please check with patient.  I saw the note from Dr. Roxan Hockey and he mentioned the patient was fatigued.  See if patient is willing to come in about that.  Thanks.

## 2020-12-02 NOTE — Telephone Encounter (Signed)
Patient called and he stated that he will be going to his heart doctor for his fatigue. He appreciated [place] calling and checking on him.

## 2020-12-04 NOTE — Telephone Encounter (Signed)
Noted. Thanks.

## 2021-01-16 DIAGNOSIS — Z87442 Personal history of urinary calculi: Secondary | ICD-10-CM | POA: Diagnosis not present

## 2021-01-16 DIAGNOSIS — N50819 Testicular pain, unspecified: Secondary | ICD-10-CM | POA: Diagnosis not present

## 2021-01-16 DIAGNOSIS — R3129 Other microscopic hematuria: Secondary | ICD-10-CM | POA: Diagnosis not present

## 2021-01-20 DIAGNOSIS — I700 Atherosclerosis of aorta: Secondary | ICD-10-CM | POA: Diagnosis not present

## 2021-01-20 DIAGNOSIS — R3129 Other microscopic hematuria: Secondary | ICD-10-CM | POA: Diagnosis not present

## 2021-01-20 DIAGNOSIS — N200 Calculus of kidney: Secondary | ICD-10-CM | POA: Diagnosis not present

## 2021-01-31 DIAGNOSIS — L918 Other hypertrophic disorders of the skin: Secondary | ICD-10-CM | POA: Diagnosis not present

## 2021-01-31 DIAGNOSIS — L821 Other seborrheic keratosis: Secondary | ICD-10-CM | POA: Diagnosis not present

## 2021-02-01 ENCOUNTER — Other Ambulatory Visit: Payer: Self-pay | Admitting: Family Medicine

## 2021-02-22 NOTE — Progress Notes (Addendum)
Cardiology Office Note   Date:  02/23/2021   ID:  Steven [name], DOB 1961/05/19, MRN [id number]  PCP:  Tonia Ghent, MD    No chief complaint on file.  Thoracic aortic aneurysm  Wt Readings from Last 3 Encounters:  02/23/21 250 lb 6.4 oz (113.6 kg)  11/29/20 250 lb (113.4 kg)  08/15/20 250 lb (113.4 kg)       History of Present Illness: Steven [name] is a 60 y.o. male   who has been followed by Dr. Roxan Hockey for the aneurysm since 10/18, found incidentally on a CT scan for lung cancer prevention.    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    In 2019, it was noted: "He exercises regularly.  He goes to the gym several days a week and does mostly cardio.  He has been counseled to avoid heavy lifting and straining.  He has been counseled by Dr. Roxan Hockey about watching for any chest discomfort or tearing pain in his chest and to immediately seek attention if this were to occur.   He does have routine imaging planned for nodules and his aneurysm.   In 2019, he had some upper abdominal pain.  It occurred after he did a lot of yard work.   2020 echo showed: "11/2018 echo:  1. The left ventricle has normal systolic function with an ejection fraction of 60-65%. The cavity size was normal. Left ventricular diastolic function could not be evaluated due to nondiagnostic images. No evidence of left ventricular regional wall  motion abnormalities.  2. The right ventricle has normal systolic function. The cavity was normal. There is no increase in right ventricular wall thickness.  3. There is mild mitral annular calcification present.  4. The aortic valve is tricuspid. Moderate sclerosis of the aortic valve. Aortic valve regurgitation is mild to moderate by color flow Doppler. Very mild stenosis of the aortic valve.  5. There is moderate dilatation of the ascending aorta and at the level of the sinuses of Valsalva measuring 46 mm."     In early  August 2021, he called the office: "Patient complaining of shoulder pain/tingling with exercise, SOB with exercise, and BLE edema. Encouraged patient to elevate his feet and reduce his salt in his diet. Patient has history of aortic aneurysm, HLD, MVP, and palpitations. Patient has appointment towards the end of the month, moved patient's appointment to 8/13 to see Dr. Irish Lack. This was the first available appointment. Encouraged patient if his symptoms get worse before his appointment to give our office a call. "   He has had left leg swelling, typically at the end of the day.  Had a severe leg injury about 12-13  years ago (2008).    2021 coronary CTA showed: "Coronary Arteries:  Normal coronary origin.  Right dominance.   RCA is a large dominant artery that gives rise to PDA and PLA. There is minimal calcified plaque in the proximal and distal portion with stenosis 0-25%.   Left main is a large artery that gives rise to LAD and LCX arteries. Left main has no plaque.   LAD is a large artery that gives rise to one diagonal artery and wraps around the apex. Proximal LAD has mild predominantly calcified plaque with stenosis 25-49%. Mid and distal LAD and D1 have only luminal irregularities.   LCX is a non-dominant artery that gives rise to two OM branches. There are only luminal irregularities."  Denies :  Chest pain. Dizziness. Leg edema. Nitroglycerin use. Orthopnea. Palpitations. Paroxysmal nocturnal dyspnea. Shortness of breath. Syncope.    Did not tolerate evening dosing of atorvastatin.   Past Medical History:  Diagnosis Date   Allergy    At risk for sleep apnea    STOP-BANG= 4     SENT TO PCP 04-15-2014   BRBPR (bright red blood per rectum) 07/14/2015   ED (erectile dysfunction) 03/10/2013   Epididymal cyst    LEFT   GERD (gastroesophageal reflux disease)    Heart murmur mild mvp  -- asymptomatic   per pt echo 2005   History of positive PPD    1997---  S/P  INH  Tx  for 6  months   Hx of adenomatous colonic polyps 04/01/2017   Hyperglycemia 03/10/2013   Hyperlipidemia    Hypogonadism male    Previously evaluated by Dr. Reece Agar with [facility] for benign noduarity in left hemiscrotum (2008) felt to be variant of normal.   Left nephrolithiasis    Testicular pain 12/01/2010   Per Dr. Gaynelle Arabian, thought to be due to epididymal cysts    Thoracic aortic aneurysm ([place]) 04/18/2017   VARICOCELE 02/17/2010   Qualifier: Diagnosis of  By: Fuller Plan CMA Deborra Medina), [name]      Past Surgical History:  Procedure Laterality Date   COLONOSCOPY     COLONOSCOPY WITH PROPOFOL  01-06-2014   POLYPECTOMY   CYSTOSCOPY WITH RETROGRADE PYELOGRAM, URETEROSCOPY AND STENT PLACEMENT Left 04/19/2014   Procedure: CYSTOSCOPY WITH RETROGRADE PYELOGRAM with interpretation, URETEROSCOPY, stone extraction with basket, AND STENT PLACEMENT, implantation of backstop;  Surgeon: Ailene Rud, MD;  Location: [facility];  Service: Urology;  Laterality: Left;   EXCISION SEBACEOUS CYST X3 OF BACK  03-13-2010   HOLMIUM LASER APPLICATION Left 0000000   Procedure: HOLMIUM LASER APPLICATION;  Surgeon: Ailene Rud, MD;  Location[facility];  Service: Urology;  Laterality: Left;   NASAL SEPTUM SURGERY  09-21-2002   POLYPECTOMY     REFRACTIVE SURGERY  1994     Current Outpatient Medications  Medication Sig Dispense Refill   acetaminophen (TYLENOL) 500 MG tablet Take 500 mg by mouth every 6 (six) hours as needed.     aspirin 81 MG tablet Take 81 mg by mouth daily.     atorvastatin (LIPITOR) 10 MG tablet TAKE 1 TABLET BY MOUTH EVERY DAY IN THE EVENING 90 tablet 1   doxycycline (VIBRA-TABS) 100 MG tablet Take 1 tablet (100 mg total) by mouth 2 (two) times daily. 10 tablet 1   sildenafil (REVATIO) 20 MG tablet Take 3-5 tablets (60-100 mg total) by mouth daily as needed. 50 tablet PRN   No current facility-administered medications for this visit.    Allergies:    Crestor [rosuvastatin calcium], Naproxen sodium, Penicillins, Amoxicillin, and Erythromycin    Social History:  The patient  reports that he quit smoking about 12 years ago. His smoking use included cigarettes. He has a 51.00 pack-year smoking history. He has never used smokeless tobacco. He reports current alcohol use. He reports that he does not use drugs.   Family History:  The patient's family history includes Alzheimer's disease in his father; Cancer in his mother; Diabetes in his brother and another family member; Kidney disease in an other family member; Parkinsonism in his father.    ROS:  Please see the history of present illness.   Otherwise, review of systems are positive for getting back into exercise.   All other systems are  reviewed and negative.    PHYSICAL EXAM: VS:  BP 130/74   Pulse 80   Ht '6\' 2"'$  (1.88 m)   Wt 250 lb 6.4 oz (113.6 kg)   SpO2 96%   BMI 32.15 kg/m  , BMI Body mass index is 32.15 kg/m. GEN: Well nourished, well developed, in no acute distress HEENT: normal Neck: no JVD, carotid bruits, or masses Cardiac: RRR; 2/6 systolic murmur, ; no rubs, or gallops,no edema  Respiratory:  clear to auscultation bilaterally, normal work of breathing GI: soft, nontender, nondistended, + BS MS: no deformity or atrophy Skin: warm and dry, no rash Neuro:  Strength and sensation are intact Psych: euthymic mood, full affect   EKG:   The ekg ordered today demonstrates NSR, nonspecific ST changes   Recent Labs: 05/25/2020: ALT 50; BUN 10; Creatinine, Ser 0.90; Potassium 4.3; Sodium 140   Lipid Panel    Component Value Date/Time   CHOL 196 05/25/2020 0807   TRIG 141.0 05/25/2020 0807   HDL 39.40 05/25/2020 0807   CHOLHDL 5 05/25/2020 0807   VLDL 28.2 05/25/2020 0807   LDLCALC 128 (H) 05/25/2020 0807   LDLDIRECT 162.0 03/06/2017 1719     Other studies Reviewed: Additional studies/ records that were reviewed today with results demonstrating: CT records  reviewed.   ASSESSMENT AND PLAN:  CAD/hyperlipidemia: non obstructive in 2021 by CTA.  Coronary calcification in th e 83rd percentile for his age. Continue atorvastatin.  Whole food, plant-based diet recommended.  Able to exercise now, walking nightly. Increase atorvastatin to 20 mg daily. Liver and lipids in 3 months.  Thoracic aortic aneurysm: Followed by CT surgery.  Gets routine imaging done.  BP well controlled. Will start metoprolol ER 25 mg daily.   Will start this in a week to be able to tell if side effects are form higher atorvastatin or metoprolol.  Mild AI: This may worsen if his aneurysm enlarges.  Watch for any signs of CHF.  No signs of fluid overload at this time.   Current medicines are reviewed at length with the patient today.  The patient concerns regarding his medicines were addressed.  The following changes have been made:  No change  Labs/ tests ordered today include:  No orders of the defined types were placed in this encounter.   Recommend 150 minutes/week of aerobic exercise Low fat, low carb, high fiber diet recommended  Disposition:   FU in 1 year   Signed, Larae Grooms, MD  02/23/2021 1:45 PM    [facility] [address], [place], [place]  [address] Phone: [phone]; Fax: [phone]

## 2021-02-23 ENCOUNTER — Other Ambulatory Visit: Payer: Self-pay

## 2021-02-23 ENCOUNTER — Ambulatory Visit (INDEPENDENT_AMBULATORY_CARE_PROVIDER_SITE_OTHER): Payer: BC Managed Care – PPO | Admitting: Interventional Cardiology

## 2021-02-23 ENCOUNTER — Encounter: Payer: Self-pay | Admitting: Interventional Cardiology

## 2021-02-23 VITALS — BP 130/74 | HR 80 | Ht 74.0 in | Wt 250.4 lb

## 2021-02-23 DIAGNOSIS — I712 Thoracic aortic aneurysm, without rupture, unspecified: Secondary | ICD-10-CM

## 2021-02-23 DIAGNOSIS — E785 Hyperlipidemia, unspecified: Secondary | ICD-10-CM

## 2021-02-23 DIAGNOSIS — I25118 Atherosclerotic heart disease of native coronary artery with other forms of angina pectoris: Secondary | ICD-10-CM | POA: Diagnosis not present

## 2021-02-23 DIAGNOSIS — I351 Nonrheumatic aortic (valve) insufficiency: Secondary | ICD-10-CM | POA: Diagnosis not present

## 2021-02-23 MED ORDER — ATORVASTATIN CALCIUM 20 MG PO TABS: 20.0000 mg | ORAL_TABLET | Freq: Every day | ORAL | 3 refills | Status: DC

## 2021-02-23 MED ORDER — METOPROLOL SUCCINATE ER 25 MG PO TB24: 25.0000 mg | ORAL_TABLET | Freq: Every day | ORAL | 3 refills | Status: DC

## 2021-02-23 NOTE — Patient Instructions (Signed)
Medication Instructions:  Your physician has recommended you make the following change in your medication: Increase Atorvastatin to 20 mg by mouth daily Start Metoprolol Succinate 25 mg by mouth daily. Increase Atorvastatin first and then if no problems start Metoprolol in about a week  *If you need a refill on your cardiac medications before your next appointment, please call your pharmacy*   Lab Work: Your physician recommends that you return for lab work on December 1,2022.  Lipid and liver profiles. This will be fasting.  The lab opens at 7:30 AM  If you have labs (blood work) drawn today and your tests are completely normal, you will receive your results only by: [place] (if you have MyChart) OR A paper copy in the mail If you have any lab test that is abnormal or we need to change your treatment, we will call you to review the results.   Testing/Procedures: none   Follow-Up: At [facility], you and your health needs are our priority.  As part of our continuing mission to provide you with exceptional heart care, we have created designated Provider Care Teams.  These Care Teams include your primary Cardiologist (physician) and Advanced Practice Providers (APPs -  Physician Assistants and Nurse Practitioners) who all work together to provide you with the care you need, when you need it.  We recommend signing up for the patient portal called "MyChart".  Sign up information is provided on this After Visit Summary.  MyChart is used to connect with patients for Virtual Visits (Telemedicine).  Patients are able to view lab/test results, encounter notes, upcoming appointments, etc.  Non-urgent messages can be sent to your provider as well.   To learn more about what you can do with MyChart, go to [email or web address].    Your next appointment:   12 month(s)  The format for your next appointment:   In Person  Provider:   You may see Larae Grooms, MD or one of the  following Advanced Practice Providers on your designated Care Team:   Melina Copa, PA-C Ermalinda Barrios, PA-C   Other Instructions

## 2021-02-27 ENCOUNTER — Other Ambulatory Visit: Payer: Self-pay | Admitting: Family Medicine

## 2021-02-27 DIAGNOSIS — E785 Hyperlipidemia, unspecified: Secondary | ICD-10-CM

## 2021-02-27 DIAGNOSIS — Z125 Encounter for screening for malignant neoplasm of prostate: Secondary | ICD-10-CM

## 2021-03-06 ENCOUNTER — Other Ambulatory Visit: Payer: BC Managed Care – PPO

## 2021-03-13 ENCOUNTER — Encounter: Payer: Self-pay | Admitting: Family Medicine

## 2021-03-13 ENCOUNTER — Other Ambulatory Visit: Payer: Self-pay

## 2021-03-13 ENCOUNTER — Ambulatory Visit (INDEPENDENT_AMBULATORY_CARE_PROVIDER_SITE_OTHER): Payer: BC Managed Care – PPO | Admitting: Family Medicine

## 2021-03-13 VITALS — BP 114/64 | HR 78 | Temp 97.8°F | Ht 74.0 in | Wt 245.0 lb

## 2021-03-13 DIAGNOSIS — Z0000 Encounter for general adult medical examination without abnormal findings: Secondary | ICD-10-CM

## 2021-03-13 DIAGNOSIS — Z7189 Other specified counseling: Secondary | ICD-10-CM

## 2021-03-13 DIAGNOSIS — Z23 Encounter for immunization: Secondary | ICD-10-CM | POA: Diagnosis not present

## 2021-03-13 DIAGNOSIS — Z125 Encounter for screening for malignant neoplasm of prostate: Secondary | ICD-10-CM | POA: Diagnosis not present

## 2021-03-13 DIAGNOSIS — E785 Hyperlipidemia, unspecified: Secondary | ICD-10-CM

## 2021-03-13 DIAGNOSIS — I350 Nonrheumatic aortic (valve) stenosis: Secondary | ICD-10-CM

## 2021-03-13 DIAGNOSIS — R490 Dysphonia: Secondary | ICD-10-CM

## 2021-03-13 LAB — CBC WITH DIFFERENTIAL/PLATELET
Basophils Absolute: 0.1 10*3/uL (ref 0.0–0.1)
Basophils Relative: 1.3 % (ref 0.0–3.0)
Eosinophils Absolute: 0.3 10*3/uL (ref 0.0–0.7)
Eosinophils Relative: 6.3 % — ABNORMAL HIGH (ref 0.0–5.0)
HCT: 40.7 % (ref 39.0–52.0)
Hemoglobin: 13.8 g/dL (ref 13.0–17.0)
Lymphocytes Relative: 43.6 % (ref 12.0–46.0)
Lymphs Abs: 2.2 10*3/uL (ref 0.7–4.0)
MCHC: 33.9 g/dL (ref 30.0–36.0)
MCV: 93.1 fl (ref 78.0–100.0)
Monocytes Absolute: 0.4 10*3/uL (ref 0.1–1.0)
Monocytes Relative: 8.8 % (ref 3.0–12.0)
Neutro Abs: 2 10*3/uL (ref 1.4–7.7)
Neutrophils Relative %: 40 % — ABNORMAL LOW (ref 43.0–77.0)
Platelets: 165 10*3/uL (ref 150.0–400.0)
RBC: 4.37 Mil/uL (ref 4.22–5.81)
RDW: 13.7 % (ref 11.5–15.5)
WBC: 5.1 10*3/uL (ref 4.0–10.5)

## 2021-03-13 LAB — COMPREHENSIVE METABOLIC PANEL
ALT: 44 U/L (ref 0–53)
AST: 47 U/L — ABNORMAL HIGH (ref 0–37)
Albumin: 4.4 g/dL (ref 3.5–5.2)
Alkaline Phosphatase: 105 U/L (ref 39–117)
BUN: 13 mg/dL (ref 6–23)
CO2: 26 mEq/L (ref 19–32)
Calcium: 9.4 mg/dL (ref 8.4–10.5)
Chloride: 106 mEq/L (ref 96–112)
Creatinine, Ser: 0.93 mg/dL (ref 0.40–1.50)
GFR: 88.52 mL/min (ref >60.00)
Glucose, Bld: 103 mg/dL — ABNORMAL HIGH (ref 70–99)
Potassium: 4 mEq/L (ref 3.5–5.1)
Sodium: 139 mEq/L (ref 135–145)
Total Bilirubin: 0.8 mg/dL (ref 0.2–1.2)
Total Protein: 7.2 g/dL (ref 6.0–8.3)

## 2021-03-13 LAB — LIPID PANEL
Cholesterol: 167 mg/dL (ref 0–200)
HDL: 37.7 mg/dL — ABNORMAL LOW (ref >39.00)
LDL Cholesterol: 104 mg/dL — ABNORMAL HIGH (ref 0–99)
NonHDL: 129.38
Total CHOL/HDL Ratio: 4
Triglycerides: 126 mg/dL (ref 0.0–149.0)
VLDL: 25.2 mg/dL (ref 0.0–40.0)

## 2021-03-13 LAB — PSA: PSA: 0.3 ng/mL (ref 0.10–4.00)

## 2021-03-13 MED ORDER — OMEPRAZOLE 20 MG PO CPDR: 20.0000 mg | DELAYED_RELEASE_CAPSULE | Freq: Every day | ORAL | Status: DC

## 2021-03-13 MED ORDER — ATORVASTATIN CALCIUM 10 MG PO TABS: 10.0000 mg | ORAL_TABLET | Freq: Every day | ORAL | Status: DC

## 2021-03-13 NOTE — Patient Instructions (Signed)
Go to the lab on the way out.   If you have mychart we'll likely use that to update you.    Try taking OTC prilosec/omeprazole '20mg'$  a day.  If your voice isn't better in 2 weeks, then let me know and we'll set you up with ENT.   We'll see about going up on the lipitor after I see your labs.   I would try adding on metoprolol later.  Only make 1 change at a time.  Take care.  Glad to see you.

## 2021-03-13 NOTE — Progress Notes (Signed)
This visit occurred during the SARS-CoV-2 public health emergency.  Safety protocols were in place, including screening questions prior to the visit, additional usage of staff PPE, and extensive cleaning of exam room while observing appropriate contact time as indicated for disinfecting solutions.  CPE- See plan.  Routine anticipatory guidance given to patient.  See health maintenance.  The possibility exists that previously documented standard health maintenance information may have been brought forward from a previous encounter into this note.  If needed, that same information has been updated to reflect the current situation based on today's encounter.    Tetanus 2015 Flu shot yearly PNA not due.   Shingles d/w pt.   covid vaccine 2022 PSA pending 2022 Colonoscopy 2020 Living will d/w pt.  If incapacitated, would have his wife speak for him.   Diet and exercise d/w pt.  Encouraged both.  HCV and HIV prev neg.  D/w pt.     His wife had cancer treatment, d/w pt.    Elevated Cholesterol: Using medications without problems: yes Muscle aches: no Diet compliance: yes Exercise: yes He is still on lipitor '10mg'$  a day.  D/w pt about possible inc to '20mg'$  pending his labs.    He isn't on metoprolol yet.  D/w pt.  He is initial dyspnea with walking that improves with continued exertion.  Stable and going on for about >1 year.    After lunch he notes a hoarse voice.  Happens most days.  Frequent throat clearing.  Not having post nasal gtt.  Rare heartburn.  No throat pain "but a little scratchy."  Voice is better by the next AM.  No lump or mass noted by patient.  No dysphagia.    PMH and SH reviewed  Meds, vitals, and allergies reviewed.   ROS: Per HPI.  Unless specifically indicated otherwise in HPI, the patient denies:  General: fever. Eyes: acute vision changes ENT: sore throat Cardiovascular: chest pain Respiratory: SOB GI: vomiting GU: dysuria Musculoskeletal: acute back  pain Derm: acute rash Neuro: acute motor dysfunction Psych: worsening mood Endocrine: polydipsia Heme: bleeding Allergy: hayfever  GEN: nad, alert and oriented HEENT: ncat, MMM, OP WNL.   NECK: supple w/o LA, no mass.  No stridor.  CV: rrr. SEM noted.  PULM: ctab, no inc wob ABD: soft, +bs EXT: no edema SKIN: no acute rash

## 2021-03-15 ENCOUNTER — Other Ambulatory Visit: Payer: Self-pay | Admitting: Family Medicine

## 2021-03-15 ENCOUNTER — Encounter: Payer: Self-pay | Admitting: Family Medicine

## 2021-03-15 DIAGNOSIS — E785 Hyperlipidemia, unspecified: Secondary | ICD-10-CM

## 2021-03-15 DIAGNOSIS — R490 Dysphonia: Secondary | ICD-10-CM | POA: Insufficient documentation

## 2021-03-15 MED ORDER — ATORVASTATIN CALCIUM 20 MG PO TABS: 20.0000 mg | ORAL_TABLET | Freq: Every day | ORAL | 3 refills | Status: DC

## 2021-03-15 NOTE — Assessment & Plan Note (Signed)
Tetanus 2015 Flu shot yearly PNA not due.   Shingles d/w pt.   covid vaccine 2022 PSA pending 2022 Colonoscopy 2020 Living will d/w pt.  If incapacitated, would have his wife speak for him.   Diet and exercise d/w pt.  Encouraged both.  HCV and HIV prev neg.  D/w pt.

## 2021-03-15 NOTE — Assessment & Plan Note (Signed)
He is still on lipitor 10mg  a day.  D/w pt about possible inc to 20mg  pending his labs.  See notes on labs.

## 2021-03-15 NOTE — Assessment & Plan Note (Signed)
He isn't on metoprolol yet.  D/w pt.  He is initial dyspnea with walking that improves with continued exertion.  Stable and going on for about >1 year.   See after visit summary.  We talked about adding on metoprolol later.  We only wanted to make 1 change at a time.

## 2021-03-15 NOTE — Assessment & Plan Note (Signed)
Discussed options.  Could be GERD related.  No mass felt in the neck.  In the meantime, would try taking OTC prilosec/omeprazole 20mg  a day.  If his voice isn't better in 2 weeks, then he will let me know and we'll set him up with ENT.

## 2021-03-15 NOTE — Assessment & Plan Note (Signed)
Living will d/w pt.  If incapacitated, would have his wife speak for him.

## 2021-03-23 ENCOUNTER — Other Ambulatory Visit: Payer: Self-pay | Admitting: Family Medicine

## 2021-03-23 DIAGNOSIS — R490 Dysphonia: Secondary | ICD-10-CM

## 2021-03-23 DIAGNOSIS — E785 Hyperlipidemia, unspecified: Secondary | ICD-10-CM

## 2021-03-23 MED ORDER — ATORVASTATIN CALCIUM 20 MG PO TABS: 10.0000 mg | ORAL_TABLET | Freq: Two times a day (BID) | ORAL | Status: DC

## 2021-04-12 DIAGNOSIS — K219 Gastro-esophageal reflux disease without esophagitis: Secondary | ICD-10-CM | POA: Diagnosis not present

## 2021-05-05 ENCOUNTER — Other Ambulatory Visit: Payer: Self-pay | Admitting: Thoracic Surgery (Cardiothoracic Vascular Surgery)

## 2021-05-05 DIAGNOSIS — I7121 Aneurysm of the ascending aorta, without rupture: Secondary | ICD-10-CM

## 2021-05-05 DIAGNOSIS — I712 Thoracic aortic aneurysm, without rupture, unspecified: Secondary | ICD-10-CM

## 2021-05-25 ENCOUNTER — Other Ambulatory Visit: Payer: Self-pay

## 2021-05-25 ENCOUNTER — Other Ambulatory Visit: Payer: BC Managed Care – PPO

## 2021-05-25 DIAGNOSIS — I25118 Atherosclerotic heart disease of native coronary artery with other forms of angina pectoris: Secondary | ICD-10-CM | POA: Diagnosis not present

## 2021-05-25 DIAGNOSIS — E785 Hyperlipidemia, unspecified: Secondary | ICD-10-CM | POA: Diagnosis not present

## 2021-05-25 LAB — LIPID PANEL
Chol/HDL Ratio: 4.2 ratio (ref 0.0–5.0)
Cholesterol, Total: 168 mg/dL (ref 100–199)
HDL: 40 mg/dL (ref >39)
LDL Chol Calc (NIH): 106 mg/dL — ABNORMAL HIGH (ref 0–99)
Triglycerides: 124 mg/dL (ref 0–149)
VLDL Cholesterol Cal: 22 mg/dL (ref 5–40)

## 2021-05-25 LAB — HEPATIC FUNCTION PANEL
ALT: 50 IU/L — ABNORMAL HIGH (ref 0–44)
AST: 63 IU/L — ABNORMAL HIGH (ref 0–40)
Albumin: 4.6 g/dL (ref 3.8–4.8)
Alkaline Phosphatase: 130 IU/L — ABNORMAL HIGH (ref 44–121)
Bilirubin Total: 0.6 mg/dL (ref 0.0–1.2)
Bilirubin, Direct: 0.16 mg/dL (ref 0.00–0.40)
Total Protein: 7.1 g/dL (ref 6.0–8.5)

## 2021-05-26 ENCOUNTER — Telehealth: Payer: Self-pay | Admitting: *Deleted

## 2021-05-26 DIAGNOSIS — E782 Mixed hyperlipidemia: Secondary | ICD-10-CM

## 2021-05-26 NOTE — Telephone Encounter (Signed)
I spoke with patient and reviewed results/recommendations with him.  He has been having shoulder pain for last 2-3 weeks and would prefer not to increase Atorvastatin at this time.

## 2021-05-26 NOTE — Telephone Encounter (Signed)
-----   Message from Jettie Booze, MD sent at 05/25/2021  7:35 PM EST ----- LDL still above target.  Increase atorvastatin to 40 mg daily.  Liver and lipids in 3 months.

## 2021-06-02 ENCOUNTER — Ambulatory Visit
Admission: RE | Admit: 2021-06-02 | Discharge: 2021-06-02 | Disposition: A | Discharge status: Home / Self Care | Payer: BC Managed Care – PPO | Source: Ambulatory Visit | Attending: Thoracic Surgery (Cardiothoracic Vascular Surgery) | Admitting: Thoracic Surgery (Cardiothoracic Vascular Surgery)

## 2021-06-02 DIAGNOSIS — I7121 Aneurysm of the ascending aorta, without rupture: Secondary | ICD-10-CM

## 2021-06-02 MED ORDER — IOPAMIDOL (ISOVUE-370) INJECTION 76%
75.0000 mL | Freq: Once | INTRAVENOUS | Status: AC | PRN
  Administered 2021-06-02: 75 mL via INTRAVENOUS

## 2021-06-02 NOTE — Telephone Encounter (Signed)
Looks like he's been on atorvastatin for quite a few years. Could try changing to rosuvastatin 20mg  daily to see if he tolerates it better. Would see if he's willing to make that change in addition to adding ezetimibe 10mg  daily, both changes would likely be needed to bring his LDL to goal < 70.   If he doesn't tolerate these changes, would be happy to see him in lipid clinic to discuss other options.

## 2021-06-02 NOTE — Telephone Encounter (Signed)
Left message to call office

## 2021-06-05 NOTE — Telephone Encounter (Signed)
Left message to call office

## 2021-06-05 NOTE — Telephone Encounter (Signed)
Patient returned call

## 2021-06-06 ENCOUNTER — Ambulatory Visit (INDEPENDENT_AMBULATORY_CARE_PROVIDER_SITE_OTHER): Payer: BC Managed Care – PPO | Admitting: Thoracic Surgery (Cardiothoracic Vascular Surgery)

## 2021-06-06 ENCOUNTER — Other Ambulatory Visit: Payer: Self-pay

## 2021-06-06 VITALS — BP 121/76 | HR 71 | Resp 20 | Ht 74.0 in | Wt 253.0 lb

## 2021-06-06 DIAGNOSIS — I712 Thoracic aortic aneurysm, without rupture, unspecified: Secondary | ICD-10-CM | POA: Diagnosis not present

## 2021-06-06 NOTE — Progress Notes (Signed)
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  He was found to have an ascending aneurysm on a low-dose CT for lung cancer screening in 2018.  Has been followed since that time.  His most recent CT I last saw him in June.  His aneurysm was stable at the 4.7 to 4.8 cm.  He has been feeling well.  He has been very busy at work.  He is not having any chest pain or pressure.  He says he does get shortness of breath with heavy exertion and workouts.  That has not changed recently.  Past Medical History:  Diagnosis Date   Allergy    At risk for sleep apnea    STOP-BANG= 4     SENT TO PCP 04-15-2014   BRBPR (bright red blood per rectum) 07/14/2015   ED (erectile dysfunction) 03/10/2013   Epididymal cyst    LEFT   GERD (gastroesophageal reflux disease)    Heart murmur mild mvp  -- asymptomatic   per pt echo 2005   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 Reece Agar with [facility] for benign noduarity in left hemiscrotum (2008) felt to be variant of normal.   Left nephrolithiasis    Testicular pain 12/01/2010   Per Dr. Gaynelle Arabian, thought to be due to epididymal cysts    Thoracic aortic aneurysm ([place]) 04/18/2017   VARICOCELE 02/17/2010   Qualifier: Diagnosis of  By: Fuller Plan CMA (AAMA), Steven [name]       Current Outpatient Medications  Medication Sig Dispense Refill   acetaminophen (TYLENOL) 500 MG tablet Take 500 mg by mouth every  6 (six) hours as needed.     aspirin 81 MG tablet Take 81 mg by mouth daily.     atorvastatin (LIPITOR) 20 MG tablet Take 0.5 tablets (10 mg total) by mouth in the morning and at bedtime.     metoprolol succinate (TOPROL XL) 25 MG 24 hr tablet Take 1 tablet (25 mg total) by mouth daily. 90 tablet 3   omeprazole (PRILOSEC) 20 MG capsule Take 1 capsule (20 mg total) by mouth daily.     sildenafil (REVATIO) 20 MG tablet Take 3-5 tablets (60-100 mg total) by mouth daily as needed. 50 tablet PRN   No current facility-administered medications for this visit.    Physical Exam BP 121/76 (BP Location: Right Arm, Patient Position: Sitting)    Pulse 71    Resp 20    Ht 6\' 2"  (1.88 m)    Wt 253 lb (114.8 kg)    SpO2 96% Comment: RA   BMI 32.49 kg/m  60 year old man in no acute distress Alert and oriented x3 with no focal deficits Lungs clear bilaterally Cardiac regular rate and rhythm with a 2/6 systolic murmur No carotid bruits No peripheral edema  Diagnostic Tests: CT ANGIOGRAPHY CHEST WITH CONTRAST  TECHNIQUE: Multidetector CT imaging of the chest was performed using the standard protocol during bolus administration of intravenous contrast. Multiplanar CT image reconstructions and MIPs were obtained to evaluate the vascular anatomy.   CONTRAST:  17mL ISOVUE-370 IOPAMIDOL (ISOVUE-370) INJECTION 76%   COMPARISON:  CTA chest, 11/14/2020 and 03/03/2020.   FINDINGS: Cardiovascular:   Limitations by motion: Moderate   Aortic Root: Motion degraded, therefore difficult to measure.   Preferential opacification of the thoracic aorta. No evidence of dissection. A conventional 3-vessel aortic LEFT arch is present. Minimal atherosclerotic burden at the origins great vessels, with greatest burden at the LEFT CCA origin. No hemodynamically significant stenosis.   Thoracic Aorta:   --Ascending Aorta: 4.7 cm   --Aortic Arch: 3.2 cm   --Descending Aorta: 3.1 cm   Other:   Aortic valve  calcifications, predominantly involving the non-coronary cusp. Coronary atherosclerosis, with moderate burden within the LAD.   Normal heart size. No pericardial effusion. Nondilated main PA. No central or larger pulmonary embolus.   Mediastinum/Nodes: No enlarged mediastinal, hilar, or axillary lymph nodes. Thyroid gland, trachea, and esophagus demonstrate no significant findings.   Lungs/Pleura: Lungs are clear. No pleural effusion or pneumothorax. Middle lobe sub-3 mm pulmonary nodules are unchanged from comparisons. See key image.   Upper Abdomen: No acute abnormality. Perihilar accessory spleen. Punctate (<3 mm) LEFT nonobstructing nephrolith.   Musculoskeletal: No chest wall abnormality. No acute osseous findings.   Review of the MIP images confirms the above findings.   IMPRESSION: 1. Ectatic ascending thoracic aorta, measuring up to 4.7 cm and similar to most recent (11/14/2020) comparison. Continue semi-annual imaging followup by CTA or MRA and referral to cardiothoracic surgery if not already obtained. This recommendation follows 2010 ACCF/AHA/AATS/ACR/ASA/SCA/SCAI/SIR/STS/SVM Guidelines for the Diagnosis and Management of Patients With Thoracic Aortic Disease. Circulation. 2010; 121: Z601-U932. Aortic aneurysm NOS (ICD10-I71.9) 2. Aortic valve calcifications, predominantly involving the non-coronary cusp. Correlate with echocardiogram, unless previously performed. 3. Coronary atherosclerosis. 4. Sub-6 mm middle lobe pulmonary nodules. These appear stable since 05/2019 comparison, therefore considered benign. 5. Punctate, nonobstructing LEFT nephrolithiasis.   Steven Birks, MD   [facility]   [facility]     Electronically Signed   By: Steven [name] M.D.   On: 06/02/2021 10:33   I personally reviewed the CT images.  There is a 4.7 cm ascending aneurysm unchanged from his most recent scan.  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  Ascending aneurysm/thoracic aortic atherosclerosis-no change over past 6 months.  Continued semiannual follow-up.  Hypertension- blood pressure well controlled on metoprolol.    Hyperlipidemia -on Lipitor for hyperlipidemia.  Apparently Dr. Hassell Done office suggested him increasing his dose but he has had trouble with muscle pain and wants to talk to them further before changing the dosage.  Plan: Follow-up with Dr. Irish Lack Return in 6 months with CT angio  Steven Nakayama, MD [facility] [phone]

## 2021-06-14 NOTE — Telephone Encounter (Addendum)
I spoke with patient.  He has tried Rosuvastatin in the past and could  not tolerate.  He would like to meet with pharmacist in the lipid clinic.  Appointment scheduled for July 05, 2021 at 8:30.

## 2021-06-14 NOTE — Addendum Note (Signed)
Addended by: Thompson Grayer on: 06/14/2021 11:33 AM   Modules accepted: Orders

## 2021-06-27 ENCOUNTER — Telehealth (INDEPENDENT_AMBULATORY_CARE_PROVIDER_SITE_OTHER): Payer: BC Managed Care – PPO | Admitting: Family Medicine

## 2021-06-27 ENCOUNTER — Other Ambulatory Visit: Payer: Self-pay | Admitting: Family Medicine

## 2021-06-27 ENCOUNTER — Encounter: Payer: Self-pay | Admitting: Family Medicine

## 2021-06-27 ENCOUNTER — Other Ambulatory Visit: Payer: Self-pay

## 2021-06-27 ENCOUNTER — Other Ambulatory Visit (INDEPENDENT_AMBULATORY_CARE_PROVIDER_SITE_OTHER): Payer: BC Managed Care – PPO

## 2021-06-27 DIAGNOSIS — U071 COVID-19: Secondary | ICD-10-CM | POA: Diagnosis not present

## 2021-06-27 LAB — BASIC METABOLIC PANEL
BUN: 11 mg/dL (ref 6–23)
CO2: 29 mEq/L (ref 19–32)
Calcium: 9.4 mg/dL (ref 8.4–10.5)
Chloride: 101 mEq/L (ref 96–112)
Creatinine, Ser: 0.94 mg/dL (ref 0.40–1.50)
GFR: 87.22 mL/min (ref >60.00)
Glucose, Bld: 124 mg/dL — ABNORMAL HIGH (ref 70–99)
Potassium: 3.8 mEq/L (ref 3.5–5.1)
Sodium: 135 mEq/L (ref 135–145)

## 2021-06-27 NOTE — Patient Instructions (Addendum)
The office will call you to set up labs so we can start Paxlovid (anti viral) Hold your atorvastatin and sildenafil while you are on it   Drink fluids and rest  Treat your symptoms Watch temperature   Drink fluids and rest  mucinex DM is good for cough and congestion (or continue your current medicines)  Nasal saline for congestion as needed  Tylenol for fever or pain or headache  Please alert [place] if symptoms worsen (if severe or short of breath please go to the ER)    Isolate for a minimum of 5 days (or until symptoms are better)  When you return to public mask for at least 10 days

## 2021-06-27 NOTE — Assessment & Plan Note (Signed)
Mild to moderate symptoms  Treatment options discussed/interested in paxlovid (qualifies due to age) 39 poss side effects  Stat labs for bmet scheduled to get GFR before starting  Rev symptom control  Antic guidance and isolation parameters ER precautions discussed  Fluids/rest and update

## 2021-06-27 NOTE — Progress Notes (Signed)
Virtual Visit via Video Note  I connected with Steven [name] on 06/27/21 at 11:30 AM EST by a video enabled telemedicine application and verified that I am speaking with the correct person using two identifiers.  Location: Patient: home Provider: office    I discussed the limitations of evaluation and management by telemedicine and the availability of in person appointments. The patient expressed understanding and agreed to proceed.  Parties involved in encounter  Patient: Steven [name]   Provider:  Loura Pardon MD   History of Present Illness: Pt presents with c/o positive covid test  61 yo pt of Dr Damita Dunnings   Saturday had sneezing -felt ok  Slt ST Sunday  Sunday evening- sinus congestion  Monday- weak and achey all over -slept all day  Temp went up to 100 -briefly Headache today- better now Congested  Slight cough every once in a wihle  Phlegm -? Color  No wheeze or sob   No n/v/d  No appetite  No change with taste or smell    Otc Nyquil Day quil  Vitamin C drink   Immunized for covid    Lab Results  Component Value Date   CREATININE 0.93 03/13/2021   BUN 13 03/13/2021   NA 139 03/13/2021   K 4.0 03/13/2021   CL 106 03/13/2021   CO2 26 03/13/2021  GFR 88.5   Patient Active Problem List   Diagnosis Date Noted   COVID-19 06/27/2021   Voice hoarseness 03/15/2021   Aortic stenosis, mild 06/07/2020   Thoracic aortic aneurysm 04/18/2017   Snoring 04/02/2017   Knee pain 10/20/2015   Muscle pain 05/27/2015   Advance care planning 03/11/2014   Hyperglycemia 03/10/2013   ED (erectile dysfunction) 03/10/2013   Routine general medical examination at a health care facility 02/29/2012   INTERMITTENT VERTIGO 05/08/2010   VARICOCELE 02/17/2010   Hypogonadism in male 02/13/2010   HLD (hyperlipidemia) 02/13/2010   PLANTAR FASCIITIS 02/13/2010   Past Medical History:  Diagnosis Date   Allergy    At risk for sleep apnea    STOP-BANG= 4     SENT TO PCP  04-15-2014   BRBPR (bright red blood per rectum) 07/14/2015   ED (erectile dysfunction) 03/10/2013   Epididymal cyst    LEFT   GERD (gastroesophageal reflux disease)    Heart murmur mild mvp  -- asymptomatic   per pt echo 2005   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 Reece Agar with [facility] for benign noduarity in left hemiscrotum (2008) felt to be variant of normal.   Left nephrolithiasis    Testicular pain 12/01/2010   Per Dr. Gaynelle Arabian, thought to be due to epididymal cysts    Thoracic aortic aneurysm 04/18/2017   VARICOCELE 02/17/2010   Qualifier: Diagnosis of  By: Fuller Plan CMA Deborra Medina), [name]     Past Surgical History:  Procedure Laterality Date   COLONOSCOPY     COLONOSCOPY WITH PROPOFOL  01-06-2014   POLYPECTOMY   CYSTOSCOPY WITH RETROGRADE PYELOGRAM, URETEROSCOPY AND STENT PLACEMENT Left 04/19/2014   Procedure: CYSTOSCOPY WITH RETROGRADE PYELOGRAM with interpretation, URETEROSCOPY, stone extraction with basket, AND STENT PLACEMENT, implantation of backstop;  Surgeon: Ailene Rud, MD;  Location: [facility];  Service: Urology;  Laterality: Left;   EXCISION SEBACEOUS CYST X3  OF BACK  03-13-2010   HOLMIUM LASER APPLICATION Left 76/28/3151   Procedure: HOLMIUM LASER APPLICATION;  Surgeon: Ailene Rud, MD;  Location: [facility];  Service: Urology;  Laterality: Left;   NASAL SEPTUM SURGERY  09-21-2002   POLYPECTOMY     REFRACTIVE SURGERY  1994   Social History   Tobacco Use   Smoking status: Former    Packs/day: 1.50    Years: 34.00    Pack years: 51.00    Types: Cigarettes    Quit date: 10/29/2008    Years since quitting: 12.6   Smokeless tobacco: Never  Vaping Use   Vaping Use: Never used  Substance Use Topics   Alcohol use: Yes    Alcohol/week: 0.0 standard drinks     Comment: Rare   Drug use: No   Family History  Problem Relation Age of Onset   Cancer Mother        Renal CA, treated 1993   Parkinsonism Father    Alzheimer's disease Father    Diabetes Brother    Diabetes Other    Kidney disease Other    Colon cancer Neg Hx    Prostate cancer Neg Hx    Esophageal cancer Neg Hx    Rectal cancer Neg Hx    Stomach cancer Neg Hx    Pancreatic cancer Neg Hx    Colon polyps Neg Hx    Allergies  Allergen Reactions   Crestor [Rosuvastatin Calcium] Other (See Comments)    Irritable.     Naproxen Sodium Swelling   Penicillins Swelling   Amoxicillin Rash   Erythromycin Rash   Current Outpatient Medications on File Prior to Visit  Medication Sig Dispense Refill   acetaminophen (TYLENOL) 500 MG tablet Take 500 mg by mouth every 6 (six) hours as needed.     aspirin 81 MG tablet Take 81 mg by mouth daily.     atorvastatin (LIPITOR) 20 MG tablet Take 0.5 tablets (10 mg total) by mouth in the morning and at bedtime.     metoprolol succinate (TOPROL XL) 25 MG 24 hr tablet Take 1 tablet (25 mg total) by mouth daily. 90 tablet 3   omeprazole (PRILOSEC) 20 MG capsule Take 1 capsule (20 mg total) by mouth daily.     sildenafil (REVATIO) 20 MG tablet Take 3-5 tablets (60-100 mg total) by mouth daily as needed. 50 tablet PRN   No current facility-administered medications on file prior to visit.   Review of Systems  Constitutional:  Positive for fever and malaise/fatigue. Negative for chills.  HENT:  Positive for congestion and sore throat. Negative for ear pain and sinus pain.   Eyes:  Negative for blurred vision, discharge and redness.  Respiratory:  Positive for cough and sputum production. Negative for shortness of breath, wheezing and stridor.   Cardiovascular:  Negative for chest pain, palpitations and leg swelling.  Gastrointestinal:  Negative for abdominal pain, diarrhea, nausea and vomiting.  Musculoskeletal:  Negative for myalgias.  Skin:   Negative for rash.  Neurological:  Positive for headaches. Negative for dizziness.     Observations/Objective: Patient appears well, in no distress Weight is baseline  No facial swelling or asymmetry Voice is hoarse No obvious tremor or mobility impairment Moving neck and UEs normally Able to hear the call well  No wheeze or shortness of breath during interview  Cough sounds junky/productive Talkative and mentally sharp with no cognitive changes No skin changes on face or neck , no rash  or pallor Affect is normal    Assessment and Plan: Problem List Items Addressed This Visit       Other   COVID-19    Mild to moderate symptoms  Treatment options discussed/interested in paxlovid (qualifies due to age) 35 poss side effects  Stat labs for bmet scheduled to get GFR before starting  Rev symptom control  Antic guidance and isolation parameters ER precautions discussed  Fluids/rest and update        Follow Up Instructions: The office will call you to set up labs so we can start Paxlovid (anti viral) Hold your atorvastatin and sildenafil while you are on it   Drink fluids and rest  Treat your symptoms Watch temperature   Drink fluids and rest  mucinex DM is good for cough and congestion (or continue your current medicines)  Nasal saline for congestion as needed  Tylenol for fever or pain or headache  Please alert [place] if symptoms worsen (if severe or short of breath please go to the ER)    Isolate for a minimum of 5 days (or until symptoms are better)  When you return to public mask for at least 10 days    I discussed the assessment and treatment plan with the patient. The patient was provided an opportunity to ask questions and all were answered. The patient agreed with the plan and demonstrated an understanding of the instructions.   The patient was advised to call back or seek an in-person evaluation if the symptoms worsen or if the condition fails to improve as  anticipated.     Loura Pardon, MD

## 2021-06-28 ENCOUNTER — Telehealth: Payer: Self-pay | Admitting: Family Medicine

## 2021-06-28 MED ORDER — NIRMATRELVIR/RITONAVIR (PAXLOVID)TABLET: 3.0000 | ORAL_TABLET | Freq: Two times a day (BID) | ORAL | 0 refills | Status: AC

## 2021-06-28 NOTE — Telephone Encounter (Signed)
Labs look good  I sent paxlovid to his pharmacy, let us know if there are any problems   Hold atorvastatin and sildenafil while taking it

## 2021-06-28 NOTE — Telephone Encounter (Signed)
Pt notified of lab results and Dr. Marliss Coots comments and that Rx was sent to  pharmacy. Pt advised which meds to hold while on Paxlovid

## 2021-07-05 ENCOUNTER — Other Ambulatory Visit: Payer: Self-pay

## 2021-07-05 ENCOUNTER — Ambulatory Visit (INDEPENDENT_AMBULATORY_CARE_PROVIDER_SITE_OTHER): Payer: BC Managed Care – PPO | Admitting: Pharmacist

## 2021-07-05 DIAGNOSIS — E785 Hyperlipidemia, unspecified: Secondary | ICD-10-CM

## 2021-07-05 MED ORDER — ATORVASTATIN CALCIUM 10 MG PO TABS: 10.0000 mg | ORAL_TABLET | Freq: Every day | ORAL | 3 refills | Status: DC

## 2021-07-05 NOTE — Progress Notes (Signed)
Patient ID: Steven [name]                 DOB: 1960-11-17                    MRN: [id number]     HPI: Steven [name] is a 61 y.o. male patient referred to lipid clinic by [place]. PMH is significant for HLD, MVP, CAD per CT, thoracic aortic aneurysm and hypogonadism. He had a CAC of 89 (83rd percentile) in 2021. Mild non-obstructive CAD (25-49%) in the proximal LAD. Patient has been on atorvastatin 20mg  daily. LDL-C in 106 on 05/25/21. Patient did not want to increase atorvastatin due to shoulder pain and he had previously not tolerated rosuvastatin. He was referred to lipid clinic.  Patient presents today to lipid clinic. He states he has tried all the statins. He has been on crestor, pravastatin and he thinks simvastatin. Patient has been on some dose of atorvastatin for a while. He started on 10mg  daily. Then decreased to 5mg  daily. Then increased back to 5mg  twice a day which seemed to help. Atorvastatin was then increased to 20mg  daily. His should pains increased with each dose increase. He stopped atorvastatin while he had COVID bc he was on Paxlovid. His shoulder pain went away.  He was going to the gym 3 times a week but has not been going since his wife had radiation for breat cancer. He plans to start going again.  Current Medications: atorvastatin 20mg  daily Intolerances: rosuvastatin, pravastatin Risk Factors: CAC 89, thoracic aortic aneurysm LDL goal: <70  Diet: breakfast: cereal (cheerios, raisin nut bran, special K) or eggs Lunch: usually goes out- [place], grilled chicken sandwich Dinner: chicken and vegetables (green beans, peas) Very rare to eat fried foods but does eat french fries 1-2 times a week Rare red meat Drink: mostly water, 1 soft drink per day, 2 cups of coffee per day Ice cream addict  Exercise: intends to resume going to the gym (treadmill, bike, light weight) 3 times a week (45-1 hr)  Family History:   The patient's family history includes Alzheimer's  disease in his father; Cancer in his mother; Diabetes in his brother and another family member; Kidney disease in an other family member; Parkinsonism in his father.   Social History: The patient  reports that he quit smoking about 12 years ago. His smoking use included cigarettes. He has a 51.00 pack-year smoking history. He has never used smokeless tobacco. He reports current alcohol use. He reports that he does not use drugs.   Labs:05/25/21 TC 168, TG 124, HDL 40, LDL-C 106 (atorvastatin 20mg  daily)  Past Medical History:  Diagnosis Date   Allergy    At risk for sleep apnea    STOP-BANG= 4     SENT TO PCP 04-15-2014   BRBPR (bright red blood per rectum) 07/14/2015   ED (erectile dysfunction) 03/10/2013   Epididymal cyst    LEFT   GERD (gastroesophageal reflux disease)    Heart murmur mild mvp  -- asymptomatic   per pt echo 2005   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 Reece Agar with [facility] for benign noduarity in left hemiscrotum (2008) felt to be variant of normal.   Left nephrolithiasis    Testicular pain  12/01/2010   Per Dr. Gaynelle Arabian, thought to be due to epididymal cysts    Thoracic aortic aneurysm 04/18/2017   VARICOCELE 02/17/2010   Qualifier: Diagnosis of  By: Fuller Plan CMA (AAMA), [name]      Current Outpatient Medications on File Prior to Visit  Medication Sig Dispense Refill   acetaminophen (TYLENOL) 500 MG tablet Take 500 mg by mouth every 6 (six) hours as needed.     aspirin 81 MG tablet Take 81 mg by mouth daily.     atorvastatin (LIPITOR) 20 MG tablet Take 0.5 tablets (10 mg total) by mouth in the morning and at bedtime.     metoprolol succinate (TOPROL XL) 25 MG 24 hr tablet Take 1 tablet (25 mg total) by mouth daily. 90 tablet 3   omeprazole (PRILOSEC) 20 MG capsule Take 1 capsule (20 mg total) by mouth  daily.     sildenafil (REVATIO) 20 MG tablet Take 3-5 tablets (60-100 mg total) by mouth daily as needed. 50 tablet PRN   No current facility-administered medications on file prior to visit.    Allergies  Allergen Reactions   Crestor [Rosuvastatin Calcium] Other (See Comments)    Irritable.     Naproxen Sodium Swelling   Penicillins Swelling   Amoxicillin Rash   Erythromycin Rash    Assessment/Plan:  1. Hyperlipidemia - LDL-C is above goal of <70. Discussed zetia vs PCSK9i. Since zetia will not get patient to goal, will start PCSK9i. I will submit prior authorization for either Repatha or Praluent. Injection technique reviewed with patient. Will decrease atorvastatin to 10mg  daily since this caused less shoulder pain for patient. I will contact patient once I hear back from insurance.   Thank you,   Ramond Dial, Pharm.D, BCPS, CPP [facility]  [address], [place], [place] [address]  Phone: [phone]; Fax: [phone]

## 2021-07-05 NOTE — Patient Instructions (Signed)
Please decrease your atorvastatin to 10 my daily I will submit a prior authorization for Repatha or Praluent. I will call you once I have a determination Please call me send me a my chart message with questions [phone]

## 2021-07-06 ENCOUNTER — Telehealth: Payer: Self-pay | Admitting: Pharmacist

## 2021-07-06 DIAGNOSIS — E785 Hyperlipidemia, unspecified: Secondary | ICD-10-CM

## 2021-07-06 MED ORDER — REPATHA SURECLICK 140 MG/ML ~~LOC~~ SOAJ: 1.0000 "pen " | SUBCUTANEOUS | 11 refills | Status: DC

## 2021-07-06 NOTE — Telephone Encounter (Addendum)
Repatha approved through 07/04/22  RxBin: [id number]  RxPCN: CN  RxGrp: [id number]  ID: [id number]  Due to patient high deductible he has used up most of the $ on copay card. He will have 2 more fills where he can use $200 per fill and then the copay card will be exhausted. Called pt. Left message with wife to call back. Sounds like pt pharmacy changed Rx to 90 day supply. Patient to be sure that he picks up 3 boxes.  Called pt. He is aware. Seems ok with cost. Labs scheduled for 12 weeks.

## 2021-08-23 ENCOUNTER — Other Ambulatory Visit: Payer: Self-pay | Admitting: Family Medicine

## 2021-09-06 ENCOUNTER — Encounter: Payer: Self-pay | Admitting: Pharmacist

## 2021-09-25 ENCOUNTER — Ambulatory Visit (INDEPENDENT_AMBULATORY_CARE_PROVIDER_SITE_OTHER): Payer: BC Managed Care – PPO | Admitting: Family Medicine

## 2021-09-25 ENCOUNTER — Other Ambulatory Visit: Payer: Self-pay | Admitting: Family Medicine

## 2021-09-25 ENCOUNTER — Encounter: Payer: Self-pay | Admitting: Family Medicine

## 2021-09-25 DIAGNOSIS — N529 Male erectile dysfunction, unspecified: Secondary | ICD-10-CM | POA: Diagnosis not present

## 2021-09-25 DIAGNOSIS — E785 Hyperlipidemia, unspecified: Secondary | ICD-10-CM

## 2021-09-25 DIAGNOSIS — B351 Tinea unguium: Secondary | ICD-10-CM

## 2021-09-25 DIAGNOSIS — L821 Other seborrheic keratosis: Secondary | ICD-10-CM | POA: Diagnosis not present

## 2021-09-25 MED ORDER — SILDENAFIL CITRATE 20 MG PO TABS: 60.0000 mg | ORAL_TABLET | Freq: Every day | ORAL | 5 refills | Status: DC | PRN

## 2021-09-25 NOTE — Patient Instructions (Addendum)
I'll await your notes and labs from cardiology.  ?Take care.  Glad to see you. ?I would keep the nail trimmed straight across.   ?The spot on your leg looks like a benign seborrheic keratosis.   ?Ask the eye clinic about the skin tags on your eyelids.   ?

## 2021-09-25 NOTE — Progress Notes (Signed)
Nail changes.  Lateral R 1st toe nail changes, possible fungal infection.  Discussed with patient about options but would likely be reasonable not to intervene with systemic treatment given his limited nail involvement and the ability to continue to trim the nail. ? ?Med list updated.  Off repatha.  He felt worse on med.  Dizziness and dry mouth resolved off med.  He has f/u labs pending with cards.  Sugar was up to 150 on home check, while on med.  Off repatha, sugar is down to ~100.  He is back to '20mg'$  lipitor.   ? ?Skin lesion on the L shin.  1cm x1cm SK, benign appearing.  Mult similar lesions on the trunk.   ? ?ED, had used sildenafil prev.  No ADE on med.   ? ?Meds, vitals, and allergies reviewed.  ? ?ROS: Per HPI unless specifically indicated in ROS section  ? ?GEN: nad, alert and oriented ?HEENT: ncat,  Mult skin tags noted, esp on the eyelids.   ?NECK: supple w/o LA ?CV: rrr.  ?PULM: ctab, no inc wob ?ABD: soft, +bs ?EXT: no edema ?SKIN: no acute rash but lesion on the L shin.  1cm x1cm SK, benign appearing.  Mult similar lesions on the trunk.   ?Lateral R 1st toe nail with onychomycotic changes. ?

## 2021-09-27 DIAGNOSIS — B351 Tinea unguium: Secondary | ICD-10-CM | POA: Insufficient documentation

## 2021-09-27 NOTE — Assessment & Plan Note (Signed)
He is going to follow-up with cardiology. Off repatha.  He felt worse on med.  Dizziness and dry mouth resolved off med.  He has f/u labs pending with cards.  Sugar was up to 150 on home check, while on med.  Off repatha, sugar is down to ~100.  He is back to '20mg'$  lipitor.  Continue Lipitor for now. ?

## 2021-09-27 NOTE — Assessment & Plan Note (Signed)
Discussed with patient about options but would likely be reasonable not to intervene with systemic treatment given his limited nail involvement and the ability to continue to trim the nail. ?

## 2021-09-27 NOTE — Assessment & Plan Note (Signed)
Benign appearing.  No intervention needed. ?

## 2021-09-27 NOTE — Assessment & Plan Note (Signed)
Continue sildenafil as needed with routine cautions.  He agrees with plan. ?

## 2021-09-28 ENCOUNTER — Other Ambulatory Visit: Payer: BC Managed Care – PPO

## 2021-09-28 DIAGNOSIS — E785 Hyperlipidemia, unspecified: Secondary | ICD-10-CM | POA: Diagnosis not present

## 2021-09-29 ENCOUNTER — Telehealth: Payer: Self-pay | Admitting: Pharmacist

## 2021-09-29 DIAGNOSIS — E785 Hyperlipidemia, unspecified: Secondary | ICD-10-CM

## 2021-09-29 LAB — LIPID PANEL
Chol/HDL Ratio: 3.5 ratio (ref 0.0–5.0)
Cholesterol, Total: 134 mg/dL (ref 100–199)
HDL: 38 mg/dL — ABNORMAL LOW (ref >39)
LDL Chol Calc (NIH): 79 mg/dL (ref 0–99)
Triglycerides: 90 mg/dL (ref 0–149)
VLDL Cholesterol Cal: 17 mg/dL (ref 5–40)

## 2021-09-29 LAB — APOLIPOPROTEIN B: Apolipoprotein B: 70 mg/dL (ref <90)

## 2021-09-29 MED ORDER — EZETIMIBE 10 MG PO TABS: 10.0000 mg | ORAL_TABLET | Freq: Every day | ORAL | 3 refills | Status: DC

## 2021-09-29 NOTE — Telephone Encounter (Signed)
Reviewed labs with patient. ApoB 70, LDL-C 79. ApoB close to goal of <70. Currently on atorvastatin '20mg'$  daily. Did not tolerate Repatha. ?Feels better off Repatha. Has been off 1 month. ?Will add ezetimibe '10mg'$  daily and repeat labs in 3 months. ? ?

## 2021-10-11 ENCOUNTER — Encounter: Payer: Self-pay | Admitting: Family Medicine

## 2021-10-11 MED ORDER — SILDENAFIL CITRATE 20 MG PO TABS: 60.0000 mg | ORAL_TABLET | Freq: Every day | ORAL | 5 refills | Status: DC | PRN

## 2021-11-01 ENCOUNTER — Other Ambulatory Visit: Payer: Self-pay | Admitting: Thoracic Surgery (Cardiothoracic Vascular Surgery)

## 2021-11-01 DIAGNOSIS — I712 Thoracic aortic aneurysm, without rupture, unspecified: Secondary | ICD-10-CM

## 2021-11-14 ENCOUNTER — Encounter: Payer: Self-pay | Admitting: Gastroenterology

## 2021-12-21 ENCOUNTER — Other Ambulatory Visit: Payer: BC Managed Care – PPO

## 2021-12-21 DIAGNOSIS — E785 Hyperlipidemia, unspecified: Secondary | ICD-10-CM

## 2021-12-22 ENCOUNTER — Telehealth: Payer: Self-pay | Admitting: Pharmacist

## 2021-12-22 ENCOUNTER — Encounter: Payer: Self-pay | Admitting: Pharmacist

## 2021-12-22 DIAGNOSIS — E785 Hyperlipidemia, unspecified: Secondary | ICD-10-CM

## 2021-12-22 LAB — HEPATIC FUNCTION PANEL
ALT: 52 IU/L — ABNORMAL HIGH (ref 0–44)
AST: 63 IU/L — ABNORMAL HIGH (ref 0–40)
Albumin: 4.4 g/dL (ref 3.8–4.8)
Alkaline Phosphatase: 122 IU/L — ABNORMAL HIGH (ref 44–121)
Bilirubin Total: 0.5 mg/dL (ref 0.0–1.2)
Bilirubin, Direct: 0.14 mg/dL (ref 0.00–0.40)
Total Protein: 6.8 g/dL (ref 6.0–8.5)

## 2021-12-22 LAB — LIPID PANEL
Chol/HDL Ratio: 4.1 ratio (ref 0.0–5.0)
Cholesterol, Total: 149 mg/dL (ref 100–199)
HDL: 36 mg/dL — ABNORMAL LOW (ref >39)
LDL Chol Calc (NIH): 96 mg/dL (ref 0–99)
Triglycerides: 90 mg/dL (ref 0–149)
VLDL Cholesterol Cal: 17 mg/dL (ref 5–40)

## 2021-12-22 LAB — APOLIPOPROTEIN B: Apolipoprotein B: 81 mg/dL (ref <90)

## 2021-12-22 MED ORDER — NEXLIZET 180-10 MG PO TABS: 1.0000 | ORAL_TABLET | Freq: Every day | ORAL | 11 refills | Status: DC

## 2021-12-22 NOTE — Addendum Note (Signed)
Addended by: Marcelle Overlie D on: 12/22/2021 12:06 PM   Modules accepted: Orders

## 2021-12-22 NOTE — Addendum Note (Signed)
Addended by: Marcelle Overlie D on: 12/22/2021 02:28 PM   Modules accepted: Orders

## 2021-12-22 NOTE — Telephone Encounter (Signed)
Reviewed labs with patient. LDL-C and ApoB have increased. Must have had some residual Repatha in system at last labs. Patient states he has been taking both atorvastatin '20mg'$  and zetia. Did not tolerate Repatha (dry mouth). Will try stopping zetia and starting Nexlizet. Continue atorvastatin '20mg'$  daily.  PA for Nexlizet approved Copay card activated  BIN [id number] PCN Loyalty GRP [id number] ID [id number]

## 2021-12-28 ENCOUNTER — Ambulatory Visit
Admission: RE | Admit: 2021-12-28 | Discharge: 2021-12-28 | Disposition: A | Discharge status: Home / Self Care | Payer: BC Managed Care – PPO | Source: Ambulatory Visit | Attending: Thoracic Surgery (Cardiothoracic Vascular Surgery) | Admitting: Thoracic Surgery (Cardiothoracic Vascular Surgery)

## 2021-12-28 ENCOUNTER — Other Ambulatory Visit: Payer: BC Managed Care – PPO

## 2021-12-28 DIAGNOSIS — I712 Thoracic aortic aneurysm, without rupture, unspecified: Secondary | ICD-10-CM | POA: Diagnosis not present

## 2021-12-28 DIAGNOSIS — I2510 Atherosclerotic heart disease of native coronary artery without angina pectoris: Secondary | ICD-10-CM | POA: Diagnosis not present

## 2021-12-28 DIAGNOSIS — N200 Calculus of kidney: Secondary | ICD-10-CM | POA: Diagnosis not present

## 2021-12-28 DIAGNOSIS — I700 Atherosclerosis of aorta: Secondary | ICD-10-CM | POA: Diagnosis not present

## 2021-12-28 MED ORDER — IOPAMIDOL (ISOVUE-370) INJECTION 76%
75.0000 mL | Freq: Once | INTRAVENOUS | Status: AC | PRN
  Administered 2021-12-28: 75 mL via INTRAVENOUS

## 2022-01-02 ENCOUNTER — Ambulatory Visit (INDEPENDENT_AMBULATORY_CARE_PROVIDER_SITE_OTHER): Payer: BC Managed Care – PPO | Admitting: Thoracic Surgery (Cardiothoracic Vascular Surgery)

## 2022-01-02 VITALS — BP 113/68 | HR 75 | Resp 20 | Ht 74.0 in | Wt 248.0 lb

## 2022-01-02 DIAGNOSIS — I712 Thoracic aortic aneurysm, without rupture, unspecified: Secondary | ICD-10-CM

## 2022-01-02 NOTE — Progress Notes (Signed)
[address]       [place] [address]             [phone]     HPI: Steven Steven [name] returns for follow-up of his ascending aneurysm.   Steven Steven [name] is a 61 year old man with a history of tobacco abuse, thoracic aortic atherosclerosis, ascending thoracic aneurysm, heart murmur, mild aortic stenosis, mild to moderate aortic insufficiency, hyperlipidemia, nephrolithiasis, and a positive PPD.  He had a low-dose screening CT for lung cancer back in 2018 and was found to have an ascending aneurysm.  I have been following him since then.  Last saw him in December 2022.  The aneurysm was stable at 4.7 cm.  He is not having any chest pain, pressure, or tightness.  He does note some pain in his shoulders and some shortness of breath when he walks up an incline.  He can walk on a treadmill at the gym for 30 minutes at a time without any issues.  Past Medical History:  Diagnosis Date   Allergy    At risk for sleep apnea    STOP-BANG= 4     SENT TO PCP 04-15-2014   BRBPR (bright red blood per rectum) 07/14/2015   ED (erectile dysfunction) 03/10/2013   Epididymal cyst    LEFT   GERD (gastroesophageal reflux disease)    Heart murmur mild mvp  -- asymptomatic   per pt echo 2005   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 Reece [name] with [facility] for benign noduarity in left hemiscrotum (2008) felt to be variant of normal.   Left nephrolithiasis    Testicular pain 12/01/2010   Per Dr. Gaynelle [name], thought to be due to epididymal cysts    Thoracic aortic aneurysm ([place]) 04/18/2017   VARICOCELE 02/17/2010   Qualifier: Diagnosis of  By: Steven Steven [name] CMA (AAMA), Steven [name]      Current Outpatient Medications  Medication Sig Dispense Refill   acetaminophen (TYLENOL) 500 MG tablet Take 500 mg by mouth every 6 (six) hours as  needed.     aspirin 81 MG tablet Take 81 mg by mouth daily.     atorvastatin (LIPITOR) 20 MG tablet Take 20 mg by mouth daily.     Bempedoic Acid-Ezetimibe (NEXLIZET) 180-10 MG TABS Take 1 tablet by mouth daily. 30 tablet 11   metoprolol succinate (TOPROL XL) 25 MG 24 hr tablet Take 1 tablet (25 mg total) by mouth daily. 90 tablet 3   sildenafil (REVATIO) 20 MG tablet Take 3-5 tablets (60-100 mg total) by mouth daily as needed. 150 tablet 5   No current facility-administered medications for this visit.    Physical Exam BP 113/68   Pulse 75   Resp 20   Ht '6\' 2"'$  (1.88 m)   Wt 248 lb (112.5 kg)   SpO2 98% Comment: RA  BMI 31.84 kg/m  Well-appearing 61 year old man in no acute distress Alert and oriented x3 with no focal deficits Lungs clear bilaterally No carotid bruits Cardiac regular rate and rhythm with a 2/6 systolic murmur No peripheral edema  Diagnostic Tests: CT ANGIOGRAPHY CHEST WITH CONTRAST   TECHNIQUE: Multidetector CT imaging of the chest was performed using the standard protocol during bolus administration of intravenous contrast. Multiplanar CT image reconstructions and MIPs  were obtained to evaluate the vascular anatomy.   RADIATION DOSE REDUCTION: This exam was performed according to the departmental dose-optimization program which includes automated exposure control, adjustment of the mA and/or kV according to patient size and/or use of iterative reconstruction technique.   CONTRAST:  26m ISOVUE-370 IOPAMIDOL (ISOVUE-370) INJECTION 76%   COMPARISON:  CTA chest dated June 02, 2021.   FINDINGS: Cardiovascular: Unchanged ascending thoracic aortic aneurysm measuring up to 4.6 cm in diameter. No dissection. Coronary, aortic arch, and branch vessel atherosclerotic vascular disease. No central pulmonary embolism. Normal heart size. No pericardial effusion.   Mediastinum/Nodes: No enlarged mediastinal, hilar, or axillary lymph nodes. Thyroid gland,  trachea, and esophagus demonstrate no significant findings.   Lungs/Pleura: No focal consolidation, pleural effusion, or pneumothorax. Sub-3 mm pulmonary nodules in the right middle lobe are unchanged dating back to at least 2020, benign. No follow-up imaging is recommended.   Upper Abdomen: No acute abnormality. Unchanged punctate right renal calculus.   Musculoskeletal: No chest wall abnormality. No acute or significant osseous findings.   Review of the MIP images confirms the above findings.   IMPRESSION: 1. Unchanged 4.6 cm ascending thoracic aortic aneurysm. 2. Aortic Atherosclerosis (ICD10-I70.0).     Electronically Signed   By: WTitus DubinM.D.   On: 12/28/2021 09:31 I personally reviewed the CT images.  Stable ascending aneurysm.  My measurement was 4.7 cm.  Stable right middle lobe lung nodules.  Coronary and thoracic aortic atherosclerosis.  Impression: Steven Steven [name] a 61year old man with a history of tobacco abuse, thoracic aortic atherosclerosis, ascending thoracic aneurysm, heart murmur, mild aortic stenosis, mild to moderate aortic insufficiency, hyperlipidemia, nephrolithiasis, and a positive PPD.  Ascending thoracic aneurysm/thoracic aortic atherosclerosis-stable at 4.7 cm.  Needs continued semiannual follow-up.  Hyperlipidemia-on Lipitor 20 mg daily.  Could not increase Lipitor due to muscle pain.  Recently started on Nexlizet.  Shoulder pain and shortness of breath when walking up inclines-stable symptoms.  He does have some evidence of coronary atherosclerosis on CT.  Urged him to seek medical attention if he has any progression of symptoms.  Mild AS/mild to moderate AI-last echo was December 2021.  Steven [name]: Return in 6 months with CT angio of chest and 2D echo  SMelrose Nakayama MD [facility] [phone]

## 2022-01-22 ENCOUNTER — Other Ambulatory Visit: Payer: Self-pay | Admitting: Interventional Cardiology

## 2022-01-23 ENCOUNTER — Ambulatory Visit (AMBULATORY_SURGERY_CENTER): Payer: Self-pay

## 2022-01-23 VITALS — Ht 74.0 in | Wt 246.0 lb

## 2022-01-23 DIAGNOSIS — Z86010 Personal history of colonic polyps: Secondary | ICD-10-CM

## 2022-01-23 MED ORDER — PEG 3350-KCL-NA BICARB-NACL 420 G PO SOLR: 4000.0000 mL | Freq: Once | ORAL | 0 refills | Status: AC

## 2022-01-23 NOTE — Progress Notes (Signed)
No egg or soy allergy known to patient  No issues known to pt with past sedation with any surgeries or procedures Patient denies ever being told they had issues or difficulty with intubation  No FH of Malignant Hyperthermia Pt is not on diet pills Pt is not on  home 02  Pt is not on blood thinners  Pt denies issues with constipation  No A fib or A flutter Have any cardiac testing pending-- only annual thoracic aneurysm eval in Nov Pt instructed to use [email or web address] or GoodRx for a price reduction on prep

## 2022-02-15 ENCOUNTER — Encounter: Payer: Self-pay | Admitting: Gastroenterology

## 2022-02-23 ENCOUNTER — Encounter: Payer: BC Managed Care – PPO | Admitting: Gastroenterology

## 2022-02-25 ENCOUNTER — Encounter: Payer: Self-pay | Admitting: Certified Registered Nurse Anesthetist

## 2022-02-28 ENCOUNTER — Telehealth: Payer: Self-pay | Admitting: Gastroenterology

## 2022-02-28 NOTE — Telephone Encounter (Signed)
Inbound call from patient stating that he is scheduled to have a colonoscopy tomorrow morning. Patient stated that he accidentally took 2 Advil around 3 yesterday and is requesting a call back to discuss if he is still okay to have procedure. Please advise.

## 2022-02-28 NOTE — Telephone Encounter (Signed)
Per Dr. Bryan Lemma, patient is ok to proceed with procedure tomorrow.  Phoned pt to let him know and to avoid any additional NSaids

## 2022-03-01 ENCOUNTER — Encounter: Payer: Self-pay | Admitting: Gastroenterology

## 2022-03-01 ENCOUNTER — Ambulatory Visit (AMBULATORY_SURGERY_CENTER): Payer: BC Managed Care – PPO | Admitting: Gastroenterology

## 2022-03-01 VITALS — BP 91/53 | HR 58 | Temp 98.0°F | Resp 10 | Ht 74.0 in | Wt 246.8 lb

## 2022-03-01 DIAGNOSIS — D128 Benign neoplasm of rectum: Secondary | ICD-10-CM

## 2022-03-01 DIAGNOSIS — K621 Rectal polyp: Secondary | ICD-10-CM

## 2022-03-01 DIAGNOSIS — D125 Benign neoplasm of sigmoid colon: Secondary | ICD-10-CM | POA: Diagnosis not present

## 2022-03-01 DIAGNOSIS — K635 Polyp of colon: Secondary | ICD-10-CM | POA: Diagnosis not present

## 2022-03-01 DIAGNOSIS — Z09 Encounter for follow-up examination after completed treatment for conditions other than malignant neoplasm: Secondary | ICD-10-CM

## 2022-03-01 DIAGNOSIS — D122 Benign neoplasm of ascending colon: Secondary | ICD-10-CM | POA: Diagnosis not present

## 2022-03-01 DIAGNOSIS — K640 First degree hemorrhoids: Secondary | ICD-10-CM

## 2022-03-01 DIAGNOSIS — D175 Benign lipomatous neoplasm of intra-abdominal organs: Secondary | ICD-10-CM | POA: Diagnosis not present

## 2022-03-01 DIAGNOSIS — Z86010 Personal history of colonic polyps: Secondary | ICD-10-CM

## 2022-03-01 DIAGNOSIS — K5730 Diverticulosis of large intestine without perforation or abscess without bleeding: Secondary | ICD-10-CM

## 2022-03-01 DIAGNOSIS — K6289 Other specified diseases of anus and rectum: Secondary | ICD-10-CM

## 2022-03-01 DIAGNOSIS — Z1211 Encounter for screening for malignant neoplasm of colon: Secondary | ICD-10-CM | POA: Diagnosis not present

## 2022-03-01 MED ORDER — SODIUM CHLORIDE 0.9 % IV SOLN: 500.0000 mL | INTRAVENOUS | Status: DC

## 2022-03-01 NOTE — Op Note (Signed)
[facility] Patient Name: Steven [name] Procedure Date: 03/01/2022 10:12 AM MRN: [id number] Endoscopist: Gerrit Heck , MD Age: 61 Referring MD:  Date of Birth: 03-Oct-1960 Gender: Male Account #: [id number] Procedure:                Colonoscopy Indications:              Surveillance: Personal history of adenomatous                            polyps on last colonoscopy 3 years ago                           Last colonoscopy was 12/2018 and notable for 4                            subcentimeter tubular adenomas, with recommendation                            to repeat in 3 years. Prior to that, colonoscopy in                            12/2013 with 2 small adenomas, and colonsocopy in                            03/2010 with 5 subcentimeter adenomas. Medicines:                Monitored Anesthesia Care Procedure:                Pre-Anesthesia Assessment:                           - Prior to the procedure, a History and Physical                            was performed, and patient medications and                            allergies were reviewed. The patient's tolerance of                            previous anesthesia was also reviewed. The risks                            and benefits of the procedure and the sedation                            options and risks were discussed with the patient.                            All questions were answered, and informed consent                            was obtained. Prior Anticoagulants: The patient has  taken no previous anticoagulant or antiplatelet                            agents. ASA Grade Assessment: III - A patient with                            severe systemic disease. After reviewing the risks                            and benefits, the patient was deemed in                            satisfactory condition to undergo the procedure.                           After obtaining informed consent,  the colonoscope                            was passed under direct vision. Throughout the                            procedure, the patient's blood pressure, pulse, and                            oxygen saturations were monitored continuously. The                            CF [id number] #[id number] was introduced through the anus                            and advanced to the the cecum, identified by                            appendiceal orifice and ileocecal valve. The                            colonoscopy was performed without difficulty. The                            patient tolerated the procedure well. The quality                            of the bowel preparation was good. The ileocecal                            valve, appendiceal orifice, and rectum were                            photographed. Scope In: 10:16:27 AM Scope Out: 89:38:10 AM Scope Withdrawal Time: 0 hours 24 minutes 46 seconds  Total Procedure Duration: 0 hours 27 minutes 45 seconds  Findings:                 The perianal and digital rectal examinations were  normal.                           Three sessile polyps were found in the ascending                            colon. The polyps were 2 to 5 mm in size. These                            polyps were removed with a cold snare. Resection                            and retrieval were complete. Estimated blood loss                            was minimal.                           A 3 mm polyp was found in the sigmoid colon. The                            polyp was sessile. The polyp was removed with a                            cold snare. Resection and retrieval were complete.                            Estimated blood loss was minimal.                           Six sessile polyps were found in the rectum and                            recto-sigmoid colon. The polyps were 1 to 3 mm in                            size. These polyps were removed  with a cold snare.                            Resection and retrieval were complete. Estimated                            blood loss was minimal.                           One 3 mm submucosal nodule was found in the rectum.                            This was firm and semi mobile. The overlying mucosa                            was normal appearing. Several tunnelled  bite-on-bite biopsies were taken with a cold                            forceps for histology. There was a                            white/yellow-hued nodule that was then visible and                            resected en bloc with forceps. This appeared to be                            a clear resection with forceps alone. Estimated                            blood loss was minimal.                           Multiple small and large-mouthed diverticula were                            found in the sigmoid colon, descending colon and                            ascending colon.                           Non-bleeding internal hemorrhoids were found during                            retroflexion. The hemorrhoids were small. Complications:            No immediate complications. Estimated Blood Loss:     Estimated blood loss was minimal. Impression:               - Three 2 to 5 mm polyps in the ascending colon,                            removed with a cold snare. Resected and retrieved.                           - One 3 mm polyp in the sigmoid colon, removed with                            a cold snare. Resected and retrieved.                           - Six 1 to 3 mm polyps in the rectum and at the                            recto-sigmoid colon, removed with a cold snare.                            Resected and retrieved.                           -  Submucosal nodule in the rectum.                            Biopsied/resected with cold forceps.                           - Diverticulosis in the sigmoid  colon, in the                            descending colon and in the ascending colon.                           - Non-bleeding internal hemorrhoids. Recommendation:           - Patient has a contact number available for                            emergencies. The signs and symptoms of potential                            delayed complications were discussed with the                            patient. Return to normal activities tomorrow.                            Written discharge instructions were provided to the                            patient.                           - Resume previous diet.                           - Continue present medications.                           - Await pathology results.                           - Repeat colonoscopy for surveillance based on                            pathology results.                           - Return to GI clinic PRN. Gerrit Heck, MD 03/01/2022 10:51:54 AM

## 2022-03-01 NOTE — Patient Instructions (Signed)
Handouts on polyps, hemorrhoids, and diverticulosis given to you today   YOU HAD AN ENDOSCOPIC PROCEDURE TODAY AT [facility]:   Refer to the procedure report that was given to you for any specific questions about what was found during the examination.  If the procedure report does not answer your questions, please call your gastroenterologist to clarify.  If you requested that your care partner not be given the details of your procedure findings, then the procedure report has been included in a sealed envelope for you to review at your convenience later.  YOU SHOULD EXPECT: Some feelings of bloating in the abdomen. Passage of more gas than usual.  Walking can help get rid of the air that was put into your GI tract during the procedure and reduce the bloating. If you had a lower endoscopy (such as a colonoscopy or flexible sigmoidoscopy) you may notice spotting of blood in your stool or on the toilet paper. If you underwent a bowel prep for your procedure, you may not have a normal bowel movement for a few days.  Please Note:  You might notice some irritation and congestion in your nose or some drainage.  This is from the oxygen used during your procedure.  There is no need for concern and it should clear up in a day or so.  SYMPTOMS TO REPORT IMMEDIATELY:  Following lower endoscopy (colonoscopy or flexible sigmoidoscopy):  Excessive amounts of blood in the stool  Significant tenderness or worsening of abdominal pains  Swelling of the abdomen that is new, acute  Fever of 100F or higher  For urgent or emergent issues, a gastroenterologist can be reached at any hour by calling [phone]. Do not use MyChart messaging for urgent concerns.    DIET:  We do recommend a small meal at first, but then you may proceed to your regular diet.  Drink plenty of fluids but you should avoid alcoholic beverages for 24 hours.  ACTIVITY:  You should plan to take it easy for the rest of today  and you should NOT DRIVE or use heavy machinery until tomorrow (because of the sedation medicines used during the test).    FOLLOW UP: Our staff will call the number listed on your records the next business day following your procedure.  We will call around 7:15- 8:00 am to check on you and address any questions or concerns that you may have regarding the information given to you following your procedure. If we do not reach you, we will leave a message.  If you develop any symptoms (ie: fever, flu-like symptoms, shortness of breath, cough etc.) before then, please call [phone].  If you test positive for Covid 19 in the 2 weeks post procedure, please call and report this information to [place].    If any biopsies were taken you will be contacted by phone or by letter within the next 1-3 weeks.  Please call us at [phone] if you have not heard about the biopsies in 3 weeks.    SIGNATURES/CONFIDENTIALITY: You and/or your care partner have signed paperwork which will be entered into your electronic medical record.  These signatures attest to the fact that that the information above on your After Visit Summary has been reviewed and is understood.  Full responsibility of the confidentiality of this discharge information lies with you and/or your care-partner.

## 2022-03-01 NOTE — Progress Notes (Signed)
Called to room to assist during endoscopic procedure.  Patient ID and intended procedure confirmed with present staff. Received instructions for my participation in the procedure from the performing physician.  

## 2022-03-01 NOTE — Progress Notes (Signed)
GASTROENTEROLOGY PROCEDURE H&P NOTE   Primary Care Physician: Tonia Ghent, MD    Reason for Procedure:  Colon Cancer screening, colon polyp surveillance  Plan:    Colonoscopy  Patient is appropriate for endoscopic procedure(s) in the ambulatory ([facility]) setting.  The nature of the procedure, as well as the risks, benefits, and alternatives were carefully and thoroughly reviewed with the patient. Ample time for discussion and questions allowed. The patient understood, was satisfied, and agreed to proceed.     HPI: Steven [name] is a 61 y.o. male who presents for colonoscopy for continued colon polyp surveillance.  No active GI symptoms.  No known family history of colon cancer or related malignancy.  Patient is otherwise without complaints or active issues today.  Last colonoscopy was 12/2018 and notable for 4 subcentimeter tubular adenomas, with recommendation to repeat in 3 years. Prior to that, colonoscopy in 12/2013 with 2 small adenomas, and colonsocopy in 03/2010 with 5 subcentimeter adenomas.   Past Medical History:  Diagnosis Date   Allergy    At risk for sleep apnea    STOP-BANG= 4     SENT TO PCP 04-15-2014   BRBPR (bright red blood per rectum) 07/14/2015   ED (erectile dysfunction) 03/10/2013   Epididymal cyst    LEFT   GERD (gastroesophageal reflux disease)    Heart murmur mild mvp  -- asymptomatic   per pt echo 2005   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 Reece Agar with [facility] for benign noduarity in left hemiscrotum (2008) felt to be variant of normal.   Left nephrolithiasis    Testicular pain 12/01/2010   Per Dr. Gaynelle Arabian, thought to be due to epididymal cysts    Thoracic aortic aneurysm ([place]) 04/18/2017   VARICOCELE 02/17/2010   Qualifier: Diagnosis of  By: Fuller Plan CMA Deborra Medina), [name]       Past Surgical History:  Procedure Laterality Date   COLONOSCOPY     COLONOSCOPY WITH PROPOFOL  01-06-2014   POLYPECTOMY   CYSTOSCOPY WITH RETROGRADE PYELOGRAM, URETEROSCOPY AND STENT PLACEMENT Left 04/19/2014   Procedure: CYSTOSCOPY WITH RETROGRADE PYELOGRAM with interpretation, URETEROSCOPY, stone extraction with basket, AND STENT PLACEMENT, implantation of backstop;  Surgeon: Ailene Rud, MD;  Location: [facility];  Service: Urology;  Laterality: Left;   EXCISION SEBACEOUS CYST X3 OF BACK  03-13-2010   HOLMIUM LASER APPLICATION Left 40/98/1191   Procedure: HOLMIUM LASER APPLICATION;  Surgeon: Ailene Rud, MD;  Location: [facility];  Service: Urology;  Laterality: Left;   NASAL SEPTUM SURGERY  09-21-2002   POLYPECTOMY     [place]    Prior to Admission medications   Medication Sig Start Date End Date Taking? Authorizing Provider  acetaminophen (TYLENOL) 500 MG tablet Take 500 mg by mouth every 6 (six) hours as needed.   Yes [provider]  aspirin 81 MG tablet Take 81 mg by mouth daily.   Yes [provider]  atorvastatin (LIPITOR) 20 MG tablet Take 20 mg by mouth daily.   Yes [provider]  Bempedoic Acid-Ezetimibe (NEXLIZET) 180-10 MG TABS Take 1 tablet by mouth daily. 12/22/21  Yes Jettie Booze, MD  metoprolol succinate (TOPROL-XL) 25 MG 24 hr tablet TAKE 1 TABLET (25 MG  TOTAL) BY MOUTH DAILY. 01/23/22  Yes Jettie Booze, MD  sildenafil (REVATIO) 20 MG tablet Take 3-5 tablets (60-100 mg total) by mouth daily as needed. 10/11/21   Tonia Ghent, MD    Current Outpatient Medications  Medication Sig Dispense Refill   acetaminophen (TYLENOL) 500 MG tablet Take 500 mg by mouth every 6 (six) hours as needed.     aspirin 81 MG tablet Take 81 mg by mouth daily.     atorvastatin (LIPITOR) 20 MG tablet Take 20 mg by mouth daily.     Bempedoic Acid-Ezetimibe (NEXLIZET) 180-10 MG  TABS Take 1 tablet by mouth daily. 30 tablet 11   metoprolol succinate (TOPROL-XL) 25 MG 24 hr tablet TAKE 1 TABLET (25 MG TOTAL) BY MOUTH DAILY. 90 tablet 0   sildenafil (REVATIO) 20 MG tablet Take 3-5 tablets (60-100 mg total) by mouth daily as needed. 150 tablet 5   Current Facility-Administered Medications  Medication Dose Route Frequency Provider Last Rate Last Admin   0.9 %  sodium chloride infusion  500 mL Intravenous Continuous Zoye Chandra V, DO        Allergies as of 03/01/2022 - Review Complete 03/01/2022  Allergen Reaction Noted   Crestor [rosuvastatin calcium] Other (See Comments) 11/22/2017   Naproxen sodium Swelling 09/03/2011   Penicillins Swelling    Repatha [evolocumab]  09/25/2021   Amoxicillin Rash    Erythromycin Rash     Family History  Problem Relation Age of Onset   Cancer Mother        Renal CA, treated 1993   Parkinsonism Father    Alzheimer's disease Father    Diabetes Brother    Diabetes Other    Kidney disease Other    Colon cancer Neg Hx    Prostate cancer Neg Hx    Esophageal cancer Neg Hx    Rectal cancer Neg Hx    Stomach cancer Neg Hx    Pancreatic cancer Neg Hx    Colon polyps Neg Hx     Social History   Socioeconomic History   Marital status: Married    Spouse name: Not on file   Number of children: 3   Years of education: Not on file   Highest education level: Not on file  Occupational History   Occupation: Engineer, maintenance (IT), works from [employer], television/film/video: [place]: [employer] [place], [employer] [place], [employer]  Tobacco Use   Smoking status: Former    Packs/day: 1.50    Years: 34.00    Total pack years: 51.00    Types: Cigarettes    Quit date: 10/29/2008    Years since quitting: 13.3   Smokeless tobacco: Never  Vaping Use   Vaping Use: Never used  Substance and Sexual Activity   Alcohol use: Not Currently    Comment: Rare- couple times per year   Drug use: Never   Sexual activity: Yes  Other Topics Concern   Not on file   Social History Narrative   Married since 1988, second marriage   3 children   1 daughter alive -POTS - monitors salt intake.     2 step-daughters (one diagnosed with Crohn's Disease.)   Regular exercise:  Yes   Social Determinants of Health   Financial Resource Strain: Not on file  Food Insecurity: Not on file  Transportation Needs: Not on file  Physical Activity: Not on file  Stress: Not on file  Social Connections: Not on file  Intimate Partner Violence:  Not on file    Physical Exam: Vital signs in last 24 hours: '@BP'$  116/64   Pulse 66   Temp 98 F (36.7 C) (Temporal)   Ht '6\' 2"'$  (1.88 m)   Wt 246 lb 12.8 oz (111.9 kg)   SpO2 96%   BMI 31.69 kg/m  GEN: NAD EYE: Sclerae anicteric ENT: MMM CV: Non-tachycardic Pulm: CTA b/l GI: Soft, NT/ND NEURO:  Alert & Oriented x 3   Gerrit Heck, DO [facility]   03/01/2022 10:07 AM

## 2022-03-01 NOTE — Progress Notes (Signed)
Pt's states no medical or surgical changes since previsit or office visit. 

## 2022-03-01 NOTE — Progress Notes (Signed)
Report given to PACU, vss 

## 2022-03-02 ENCOUNTER — Telehealth: Payer: Self-pay

## 2022-03-02 NOTE — Telephone Encounter (Signed)
  Follow up Call-     03/01/2022    9:57 AM  Call back number  Post procedure Call Back phone  # [phone]  Permission to leave phone message Yes     Patient questions:  Do you have a fever, pain , or abdominal swelling? No. Pain Score  0 *  Have you tolerated food without any problems? Yes.    Have you been able to return to your normal activities? Yes.    Do you have any questions about your discharge instructions: Diet   No. Medications  No. Follow up visit  No.  Do you have questions or concerns about your Care? No.  Actions: * If pain score is 4 or above: No action needed, pain <4.

## 2022-03-05 ENCOUNTER — Encounter: Payer: Self-pay | Admitting: Gastroenterology

## 2022-03-07 ENCOUNTER — Ambulatory Visit: Payer: BC Managed Care – PPO | Attending: Interventional Cardiology

## 2022-03-07 DIAGNOSIS — E785 Hyperlipidemia, unspecified: Secondary | ICD-10-CM

## 2022-03-08 LAB — COMPREHENSIVE METABOLIC PANEL
ALT: 42 IU/L (ref 0–44)
AST: 62 IU/L — ABNORMAL HIGH (ref 0–40)
Albumin/Globulin Ratio: 1.8 (ref 1.2–2.2)
Albumin: 4.5 g/dL (ref 3.9–4.9)
Alkaline Phosphatase: 104 IU/L (ref 44–121)
BUN/Creatinine Ratio: 14 (ref 10–24)
BUN: 12 mg/dL (ref 8–27)
Bilirubin Total: 0.5 mg/dL (ref 0.0–1.2)
CO2: 24 mmol/L (ref 20–29)
Calcium: 9.5 mg/dL (ref 8.6–10.2)
Chloride: 107 mmol/L — ABNORMAL HIGH (ref 96–106)
Creatinine, Ser: 0.84 mg/dL (ref 0.76–1.27)
Globulin, Total: 2.5 g/dL (ref 1.5–4.5)
Glucose: 113 mg/dL — ABNORMAL HIGH (ref 70–99)
Potassium: 4.4 mmol/L (ref 3.5–5.2)
Sodium: 143 mmol/L (ref 134–144)
Total Protein: 7 g/dL (ref 6.0–8.5)
eGFR: 99 mL/min/{1.73_m2} (ref >59)

## 2022-03-08 LAB — APOLIPOPROTEIN B: Apolipoprotein B: 53 mg/dL (ref <90)

## 2022-03-08 LAB — LIPID PANEL
Chol/HDL Ratio: 3 ratio (ref 0.0–5.0)
Cholesterol, Total: 102 mg/dL (ref 100–199)
HDL: 34 mg/dL — ABNORMAL LOW (ref >39)
LDL Chol Calc (NIH): 46 mg/dL (ref 0–99)
Triglycerides: 123 mg/dL (ref 0–149)
VLDL Cholesterol Cal: 22 mg/dL (ref 5–40)

## 2022-03-20 ENCOUNTER — Encounter: Payer: Self-pay | Admitting: Family

## 2022-03-20 ENCOUNTER — Telehealth (INDEPENDENT_AMBULATORY_CARE_PROVIDER_SITE_OTHER): Payer: BC Managed Care – PPO | Admitting: Family

## 2022-03-20 VITALS — BP 118/73 | HR 86 | Wt 246.8 lb

## 2022-03-20 DIAGNOSIS — Z20822 Contact with and (suspected) exposure to covid-19: Secondary | ICD-10-CM | POA: Diagnosis not present

## 2022-03-20 DIAGNOSIS — U071 COVID-19: Secondary | ICD-10-CM

## 2022-03-20 LAB — POC COVID19 BINAXNOW: SARS Coronavirus 2 Ag: POSITIVE — AB

## 2022-03-20 MED ORDER — MOLNUPIRAVIR EUA 200MG CAPSULE: 4.0000 | ORAL_CAPSULE | Freq: Two times a day (BID) | ORAL | 0 refills | Status: AC

## 2022-03-20 NOTE — Progress Notes (Signed)
MyChart Video Visit    Virtual Visit via Video Note   This visit type was conducted due to national recommendations for restrictions regarding the COVID-19 Pandemic (e.g. social distancing) in an effort to limit this patient's exposure and mitigate transmission in our community. This patient is at least at moderate risk for complications without adequate follow up. This format is felt to be most appropriate for this patient at this time. Physical exam was limited by quality of the video and audio technology used for the visit. CMA was able to get the patient set up on a video visit.  Patient location: Home. Patient and provider in visit Provider location: Office  I discussed the limitations of evaluation and management by telemedicine and the availability of in person appointments. The patient expressed understanding and agreed to proceed.  Visit Date: 03/20/2022  Today's healthcare provider: Eugenia Pancoast, FNP     Subjective:    Patient ID: Steven [name], male    DOB: 07/18/1960, 61 y.o.   MRN: [id number]  Chief Complaint  Patient presents with   Covid Positive    Coughing, itching, and headache    HPI  Pt here today via video visit with concerns.   Tested covid positive this am.  He started with symptoms yesterday, he has had covid before, around 06/2021.  He does not know who he may have been exposed to with covid.  He does report a cough and scratchy sore throat with some mild chest congestion.  With a headache as well, cough is dry non productive.  No wheezing sob and or fever.   Not taking anything otc this am but last night did take some nyquil.   Past Medical History:  Diagnosis Date   Allergy    At risk for sleep apnea    STOP-BANG= 4     SENT TO PCP 04-15-2014   BRBPR (bright red blood per rectum) 07/14/2015   ED (erectile dysfunction) 03/10/2013   Epididymal cyst    LEFT   GERD (gastroesophageal reflux disease)    Heart murmur mild mvp  --  asymptomatic   per pt echo 2005   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 Reece Agar with [facility] for benign noduarity in left hemiscrotum (2008) felt to be variant of normal.   Left nephrolithiasis    Testicular pain 12/01/2010   Per Dr. Gaynelle Arabian, thought to be due to epididymal cysts    Thoracic aortic aneurysm ([place]) 04/18/2017   VARICOCELE 02/17/2010   Qualifier: Diagnosis of  By: Fuller Plan CMA Deborra Medina), [name]      Past Surgical History:  Procedure Laterality Date   COLONOSCOPY     COLONOSCOPY WITH PROPOFOL  01-06-2014   POLYPECTOMY   CYSTOSCOPY WITH RETROGRADE PYELOGRAM, URETEROSCOPY AND STENT PLACEMENT Left 04/19/2014   Procedure: CYSTOSCOPY WITH RETROGRADE PYELOGRAM with interpretation, URETEROSCOPY, stone extraction with basket, AND STENT PLACEMENT, implantation of backstop;  Surgeon: Ailene Rud, MD;  Location[facility];  Service: Urology;  Laterality: Left;   EXCISION SEBACEOUS CYST X3 OF BACK  03-13-2010   HOLMIUM LASER APPLICATION Left 32/99/2426   Procedure: HOLMIUM LASER APPLICATION;  Surgeon: Ailene Rud, MD;  Location: [facility];  Service: Urology;  Laterality: Left;   NASAL SEPTUM SURGERY  09-21-2002   POLYPECTOMY     [place]    Family History  Problem Relation Age of Onset   Cancer Mother        Renal CA, treated 1993   Parkinsonism Father    Alzheimer's disease Father    Diabetes Brother    Diabetes Other    Kidney disease Other    Colon cancer Neg Hx    Prostate cancer Neg Hx    Esophageal cancer Neg Hx    Rectal cancer Neg Hx    Stomach cancer Neg Hx    Pancreatic cancer Neg Hx    Colon polyps Neg Hx     Social History   Socioeconomic History   Marital status: Married    Spouse name: Not on file   Number of children: 3    Years of education: Not on file   Highest education level: Not on file  Occupational History   Occupation: Engineer, maintenance (IT), works from home    Employer: SELF EMPLOYED    Comment: [employer], [place], [employer], [employer]  Tobacco Use   Smoking status: Former    Packs/day: 1.50    Years: 34.00    Total pack years: 51.00    Types: Cigarettes    Quit date: 10/29/2008    Years since quitting: 13.4   Smokeless tobacco: Never  Vaping Use   Vaping Use: Never used  Substance and Sexual Activity   Alcohol use: Not Currently    Comment: Rare- couple times per year   Drug use: Never   Sexual activity: Yes  Other Topics Concern   Not on file  Social History Narrative   Married since 1988, second marriage   3 children   1 daughter alive -POTS - monitors salt intake.     2 step-daughters (one diagnosed with Crohn's Disease.)   Regular exercise:  Yes   Social Determinants of Health   Financial Resource Strain: Not on file  Food Insecurity: Not on file  Transportation Needs: Not on file  Physical Activity: Not on file  Stress: Not on file  Social Connections: Not on file  Intimate Partner Violence: Not on file    Outpatient Medications Prior to Visit  Medication Sig Dispense Refill   acetaminophen (TYLENOL) 500 MG tablet Take 500 mg by mouth every 6 (six) hours as needed.     aspirin 81 MG tablet Take 81 mg by mouth daily.     atorvastatin (LIPITOR) 20 MG tablet Take 20 mg by mouth daily.     Bempedoic Acid-Ezetimibe (NEXLIZET) 180-10 MG TABS Take 1 tablet by mouth daily. 30 tablet 11   metoprolol succinate (TOPROL-XL) 25 MG 24 hr tablet TAKE 1 TABLET (25 MG TOTAL) BY MOUTH DAILY. 90 tablet 0   sildenafil (REVATIO) 20 MG tablet Take 3-5 tablets (60-100 mg total) by mouth daily as needed. 150 tablet 5   No facility-administered medications prior to visit.    Allergies  Allergen Reactions   Crestor [Rosuvastatin Calcium] Other (See Comments)    Irritable.     Naproxen Sodium Swelling    Penicillins Swelling   Repatha [Evolocumab]     Elevated sugar, fatigue, dry mouth.    Erythromycin Rash    Review of Systems     Objective:    Physical Exam Vitals reviewed.  Constitutional:      Appearance: Normal appearance. He is obese.  Pulmonary:     Effort: Pulmonary effort is normal.  Neurological:     General:  No focal deficit present.     Mental Status: He is alert and oriented to person, place, and time. Mental status is at baseline.  Psychiatric:        Mood and Affect: Mood normal.        Behavior: Behavior normal.        Thought Content: Thought content normal.        Judgment: Judgment normal.     BP 118/73   Pulse 86   Wt 246 lb 12 oz (111.9 kg)   SpO2 96%   BMI 31.68 kg/m  Wt Readings from Last 3 Encounters:  03/20/22 246 lb 12 oz (111.9 kg)  03/01/22 246 lb 12.8 oz (111.9 kg)  01/23/22 246 lb (111.6 kg)       Assessment & Plan:   Problem List Items Addressed This Visit       Other   COVID-19    Advised of CDC guidelines for self isolation/ ending isolation.  Advised of safe practice guidelines. Symptom Tier reviewed.  Encouraged to monitor for any worsening symptoms; watch for increased shortness of breath, weakness, and signs of dehydration. Advised when to seek emergency care.  Instructed to rest and hydrate well.  Advised to leave the house during recommended isolation period, only if it is necessary to seek medical care  Pt considered high risk for hospitalization. have decided pt is a candidate for antiviral and pt agrees that she would like to take this. I have sent in RX for molnupiravir 200 mg capsules to be taken as directed.       Relevant Medications   molnupiravir EUA (LAGEVRIO) 200 mg CAPS capsule   Other Visit Diagnoses     Exposure to COVID-19 virus    -  Primary   Relevant Orders   POC COVID-19 BinaxNow (Completed)       I am having Gwyndolyn Saxon T. Yeager "Bill" start on molnupiravir EUA. I am also having him maintain  his aspirin, acetaminophen, atorvastatin, sildenafil, Nexlizet, and metoprolol succinate.  Meds ordered this encounter  Medications   molnupiravir EUA (LAGEVRIO) 200 mg CAPS capsule    Sig: Take 4 capsules (800 mg total) by mouth 2 (two) times daily for 5 days.    Dispense:  40 capsule    Refill:  0    Order Specific Question:   Supervising Provider    Answer:   BEDSOLE, AMY E [[id number]]    I discussed the assessment and treatment plan with the patient. The patient was provided an opportunity to ask questions and all were answered. The patient agreed with the plan and demonstrated an understanding of the instructions.   The patient was advised to call back or seek an in-person evaluation if the symptoms worsen or if the condition fails to improve as anticipated.  I provided 15 minutes of face-to-face time during this encounter.   Eugenia Pancoast, [place] [facility] [place] [phone] (phone) [phone] (fax)  [place]

## 2022-03-20 NOTE — Patient Instructions (Signed)
------------------------------------ 

## 2022-03-25 DIAGNOSIS — U071 COVID-19: Secondary | ICD-10-CM | POA: Insufficient documentation

## 2022-03-25 NOTE — Assessment & Plan Note (Signed)
Advised of CDC guidelines for self isolation/ ending isolation.  Advised of safe practice guidelines. Symptom Tier reviewed.  Encouraged to monitor for any worsening symptoms; watch for increased shortness of breath, weakness, and signs of dehydration. Advised when to seek emergency care.  Instructed to rest and hydrate well.  Advised to leave the house during recommended isolation period, only if it is necessary to seek medical care Pt considered high risk for hospitalization. have decided pt is a candidate for antiviral and pt agrees that she would like to take this. I have sent in RX for molnupiravir 200 mg capsules to be taken as directed.  

## 2022-04-20 ENCOUNTER — Other Ambulatory Visit: Payer: Self-pay | Admitting: Family Medicine

## 2022-04-20 DIAGNOSIS — E785 Hyperlipidemia, unspecified: Secondary | ICD-10-CM

## 2022-05-06 ENCOUNTER — Other Ambulatory Visit: Payer: Self-pay | Admitting: Interventional Cardiology

## 2022-05-16 ENCOUNTER — Other Ambulatory Visit: Payer: Self-pay | Admitting: Thoracic Surgery (Cardiothoracic Vascular Surgery)

## 2022-05-16 DIAGNOSIS — I7121 Aneurysm of the ascending aorta, without rupture: Secondary | ICD-10-CM

## 2022-06-27 ENCOUNTER — Ambulatory Visit (HOSPITAL_COMMUNITY): Payer: BC Managed Care – PPO | Attending: Cardiology

## 2022-06-27 DIAGNOSIS — I7121 Aneurysm of the ascending aorta, without rupture: Secondary | ICD-10-CM | POA: Diagnosis not present

## 2022-06-27 LAB — ECHOCARDIOGRAM COMPLETE
AR max vel: 2.78 cm2
AV Area VTI: 3.02 cm2
AV Area mean vel: 2.72 cm2
AV Mean grad: 10.6 mmHg
AV Peak grad: 18.3 mmHg
Ao pk vel: 2.14 m/s
Area-P 1/2: 3.39 cm2
P 1/2 time: 485 msec
S' Lateral: 2.9 cm

## 2022-07-10 ENCOUNTER — Encounter: Payer: Self-pay | Admitting: Thoracic Surgery (Cardiothoracic Vascular Surgery)

## 2022-07-12 ENCOUNTER — Ambulatory Visit
Admission: RE | Admit: 2022-07-12 | Discharge: 2022-07-12 | Disposition: A | Discharge status: Home / Self Care | Payer: BC Managed Care – PPO | Source: Ambulatory Visit | Attending: Thoracic Surgery (Cardiothoracic Vascular Surgery) | Admitting: Thoracic Surgery (Cardiothoracic Vascular Surgery)

## 2022-07-12 DIAGNOSIS — I712 Thoracic aortic aneurysm, without rupture, unspecified: Secondary | ICD-10-CM | POA: Diagnosis not present

## 2022-07-12 DIAGNOSIS — J439 Emphysema, unspecified: Secondary | ICD-10-CM | POA: Diagnosis not present

## 2022-07-12 DIAGNOSIS — I7121 Aneurysm of the ascending aorta, without rupture: Secondary | ICD-10-CM

## 2022-07-12 DIAGNOSIS — I700 Atherosclerosis of aorta: Secondary | ICD-10-CM | POA: Diagnosis not present

## 2022-07-12 DIAGNOSIS — I2510 Atherosclerotic heart disease of native coronary artery without angina pectoris: Secondary | ICD-10-CM | POA: Diagnosis not present

## 2022-07-12 MED ORDER — IOPAMIDOL (ISOVUE-370) INJECTION 76%
75.0000 mL | Freq: Once | INTRAVENOUS | Status: AC | PRN
  Administered 2022-07-12: 75 mL via INTRAVENOUS

## 2022-07-17 ENCOUNTER — Ambulatory Visit (INDEPENDENT_AMBULATORY_CARE_PROVIDER_SITE_OTHER): Payer: BC Managed Care – PPO | Admitting: Thoracic Surgery (Cardiothoracic Vascular Surgery)

## 2022-07-17 ENCOUNTER — Encounter: Payer: Self-pay | Admitting: Thoracic Surgery (Cardiothoracic Vascular Surgery)

## 2022-07-17 VITALS — BP 148/73 | HR 78 | Resp 20 | Ht 74.0 in | Wt 248.0 lb

## 2022-07-17 DIAGNOSIS — I712 Thoracic aortic aneurysm, without rupture, unspecified: Secondary | ICD-10-CM | POA: Diagnosis not present

## 2022-07-17 NOTE — Progress Notes (Signed)
[address]       [place] [address]             [phone]     HPI: Mr. Dietrick returns for follow-up of his ascending aneurysm.  Thaddaeus Granja is a 62 year old man with a history of tobacco abuse, thoracic aortic atherosclerosis, ascending thoracic aneurysm, heart murmur, mild aortic stenosis, mild to moderate aortic insufficiency, hyperlipidemia, nephrolithiasis, and a positive PPD.  Found to have an aneurysm in 2018 on a CT for lung cancer screening.  He has been followed with CT angiograms since then.  I last saw him in July 2023.  Aneurysm reported 4.6 cm but the measurement had been vacillating between 4.6 and 4.7 cm from scan to scan.  In the interim since his last visit he has been doing well.  No chest pain, pressure, tightness.  Short of breath with heavy exertion but no change from prior.  Past Medical History:  Diagnosis Date   Allergy    At risk for sleep apnea    STOP-BANG= 4     SENT TO PCP 04-15-2014   BRBPR (bright red blood per rectum) 07/14/2015   ED (erectile dysfunction) 03/10/2013   Epididymal cyst    LEFT   GERD (gastroesophageal reflux disease)    Heart murmur mild mvp  -- asymptomatic   per pt echo 2005   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 Reece Agar with [facility] for benign noduarity in left hemiscrotum (2008) felt to be variant of normal.   Left nephrolithiasis    Testicular pain 12/01/2010   Per Dr. Gaynelle Arabian, thought to be due to epididymal cysts    Thoracic aortic aneurysm ([place]) 04/18/2017   VARICOCELE 02/17/2010   Qualifier: Diagnosis of  By: Fuller Plan CMA (AAMA), [name]      Current Outpatient Medications  Medication Sig Dispense Refill   acetaminophen (TYLENOL) 500 MG tablet Take 500 mg by mouth every 6 (six) hours as needed.     aspirin 81 MG tablet Take  81 mg by mouth daily.     atorvastatin (LIPITOR) 20 MG tablet TAKE 1 TABLET BY MOUTH EVERY DAY 90 tablet 1   Bempedoic Acid-Ezetimibe (NEXLIZET) 180-10 MG TABS Take 1 tablet by mouth daily. 30 tablet 11   metoprolol succinate (TOPROL-XL) 25 MG 24 hr tablet Take 1 tablet (25 mg total) by mouth daily. Please keep appt.in [place] in order to receive future refills. Thank You. 90 tablet 0   sildenafil (REVATIO) 20 MG tablet Take 3-5 tablets (60-100 mg total) by mouth daily as needed. 150 tablet 5   No current facility-administered medications for this visit.    Physical Exam BP (!) 148/73 (BP Location: Left Arm, Patient Position: Sitting, Cuff Size: Normal)   Pulse 78   Resp 20   Ht '6\' 2"'$  (1.88 m)   Wt 248 lb (112.5 kg)   SpO2 97% Comment: RA  BMI 31.30 kg/m  62 year old man in no acute distress Alert and oriented x 3 with no focal deficits Lungs clear bilaterally Cardiac regular rate and rhythm with a 2/6 systolic murmur No peripheral edema Pulses intact  Diagnostic Tests: Echocardiogram 06/27/2022 IMPRESSIONS     1. Left ventricular ejection fraction, by estimation, is 65 to 70%. The  left ventricle has normal function. The left ventricle has no regional  wall motion abnormalities. There is moderate concentric left ventricular  hypertrophy. Left ventricular  diastolic parameters were normal.   2. Right ventricular systolic function is normal. The right ventricular  size is normal. There is normal pulmonary artery systolic pressure.   3. The mitral valve is normal in structure. Mild mitral valve  regurgitation. No evidence of mitral stenosis.   4. The aortic valve is tricuspid. There is moderate calcification of the  aortic valve. There is moderate thickening of the aortic valve. Aortic  valve regurgitation is mild to moderate. Mild aortic valve stenosis.  Aortic regurgitation PHT measures 485  msec. Aortic valve mean gradient measures 10.6 mmHg.   5. Aortic  dilatation noted. There is moderate dilatation of the ascending  aorta, measuring 48 mm.   6. The inferior vena cava is normal in size with greater than 50%  respiratory variability, suggesting right atrial pressure of 3 mmHg.   CT ANGIOGRAPHY CHEST WITH CONTRAST   TECHNIQUE: Multidetector CT imaging of the chest was performed using the standard protocol during bolus administration of intravenous contrast. Multiplanar CT image reconstructions and MIPs were obtained to evaluate the vascular anatomy.   RADIATION DOSE REDUCTION: This exam was performed according to the departmental dose-optimization program which includes automated exposure control, adjustment of the mA and/or kV according to patient size and/or use of iterative reconstruction technique.   CONTRAST:  12m ISOVUE-370 IOPAMIDOL (ISOVUE-370) INJECTION 76%   COMPARISON:  12/28/2021   FINDINGS: Cardiovascular: The heart size is normal. No substantial pericardial effusion. Coronary artery calcification is evident. Mild atherosclerotic calcification is noted in the wall of the thoracic aorta. Ascending thoracic aorta measures 4.7 cm diameter today compared to 4.6 cm previously.   Mediastinum/Nodes: No mediastinal lymphadenopathy. There is no hilar lymphadenopathy. The esophagus has normal imaging features. There is no axillary lymphadenopathy.   Lungs/Pleura: Paraseptal emphysema noted. 4 mm right middle lobe subpleural nodule on image 100/11 is stable. No new suspicious pulmonary nodule or mass.   Upper Abdomen: 5 mm nonobstructing stone identified upper pole left kidney. Visualized upper abdomen otherwise unremarkable.   Musculoskeletal: No worrisome lytic or sclerotic osseous abnormality.   Review of the MIP images confirms the above findings.   IMPRESSION: 1. 4.7 cm ascending thoracic aortic aneurysm. No substantial change. 2. 5 mm nonobstructing stone upper pole left kidney. 3.  Emphysema (ICD10-J43.9) and  Aortic Atherosclerosis (ICD10-170.0)     Electronically Signed   By: EMisty StanleyM.D.   On: 07/12/2022 11:27 I personally reviewed the CT images.  4.7 cm ascending aneurysm.  Stable small right middle lobe nodule.  Impression: WIsadore Palecekis a 62year old man with a history of tobacco abuse, thoracic aortic atherosclerosis, ascending thoracic aneurysm, heart murmur, mild aortic stenosis, mild to moderate aortic insufficiency, hyperlipidemia, nephrolithiasis, and a positive PPD.  Ascending aneurysm-stable around 4.7 cm.  Needs continued semiannual follow-up.  Hypertension-blood pressure elevated at 1338systolic today.  He does not check himself all the time at home but when he does it usually in the 120s.  I did not make any medication changes today, but I did advise him to check himself at least a couple of times a week at home and if he is seeing numbers above 130 we need to adjust or add medications.  Aortic stenosis-mild.  Preserved left-ventricular function.  Plan: Monitor blood pressure at home Return in 6 months with CT angiogram of chest  SRevonda Standard  Roxan Hockey, MD [facility] [phone]

## 2022-07-18 ENCOUNTER — Other Ambulatory Visit (HOSPITAL_COMMUNITY): Payer: Self-pay

## 2022-07-25 NOTE — Progress Notes (Unsigned)
Cardiology Office Note   Date:  07/26/2022   ID:  SHUNSUKE GRANZOW, DOB 1961/04/29, MRN [id number]  PCP:  Tonia Ghent, MD    No chief complaint on file.  Thoracic aortic aneurysm  Wt Readings from Last 3 Encounters:  07/26/22 247 lb (112 kg)  07/17/22 248 lb (112.5 kg)  03/20/22 246 lb 12 oz (111.9 kg)       History of Present Illness: Steven [name] is a 62 y.o. male   who has been followed by Dr. Roxan Hockey for the aneurysm since 10/18, found incidentally on a CT scan for lung cancer prevention.    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    In 2019, it was noted: "He exercises regularly.  He goes to the gym several days a week and does mostly cardio.  He has been counseled to avoid heavy lifting and straining.  He has been counseled by Dr. Roxan Hockey about watching for any chest discomfort or tearing pain in his chest and to immediately seek attention if this were to occur.   He does have routine imaging planned for nodules and his aneurysm.   In 2019, he had some upper abdominal pain.  It occurred after he did a lot of yard work.   2020 echo showed: "11/2018 echo:  1. The left ventricle has normal systolic function with an ejection fraction of 60-65%. The cavity size was normal. Left ventricular diastolic function could not be evaluated due to nondiagnostic images. No evidence of left ventricular regional wall  motion abnormalities.  2. The right ventricle has normal systolic function. The cavity was normal. There is no increase in right ventricular wall thickness.  3. There is mild mitral annular calcification present.  4. The aortic valve is tricuspid. Moderate sclerosis of the aortic valve. Aortic valve regurgitation is mild to moderate by color flow Doppler. Very mild stenosis of the aortic valve.  5. There is moderate dilatation of the ascending aorta and at the level of the sinuses of Valsalva measuring 46 mm."     In early  August 2021, he called the office: "Patient complaining of shoulder pain/tingling with exercise, SOB with exercise, and BLE edema. Encouraged patient to elevate his feet and reduce his salt in his diet. Patient has history of aortic aneurysm, HLD, MVP, and palpitations. Patient has appointment towards the end of the month, moved patient's appointment to 8/13 to see Dr. Irish Lack. This was the first available appointment. Encouraged patient if his symptoms get worse before his appointment to give our office a call. "   He has had left leg swelling, typically at the end of the day.  Had a severe leg injury about 12-13  years ago (2008).    2021 coronary CTA showed: "Coronary Arteries:  Normal coronary origin.  Right dominance.   RCA is a large dominant artery that gives rise to PDA and PLA. There is minimal calcified plaque in the proximal and distal portion with stenosis 0-25%.   Left main is a large artery that gives rise to LAD and LCX arteries. Left main has no plaque.   LAD is a large artery that gives rise to one diagonal artery and wraps around the apex. Proximal LAD has mild predominantly calcified plaque with stenosis 25-49%. Mid and distal LAD and D1 have only luminal irregularities.   LCX is a non-dominant artery that gives rise to two OM branches. There are only luminal irregularities."  Had  some shoulder pain, worse with raising.  Decreased atorvastatin to 10 mg daily and had some improvement.  Eating out less.    Denies : Chest pain. Dizziness. Leg edema. Nitroglycerin use. Orthopnea. Palpitations. Paroxysmal nocturnal dyspnea. Shortness of breath. Syncope.    Past Medical History:  Diagnosis Date   Allergy    At risk for sleep apnea    STOP-BANG= 4     SENT TO PCP 04-15-2014   BRBPR (bright red blood per rectum) 07/14/2015   ED (erectile dysfunction) 03/10/2013   Epididymal cyst    LEFT   GERD (gastroesophageal reflux disease)    Heart murmur mild mvp  -- asymptomatic    per pt echo 2005   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 Reece Agar with [facility] for benign noduarity in left hemiscrotum (2008) felt to be variant of normal.   Left nephrolithiasis    Testicular pain 12/01/2010   Per Dr. Gaynelle Arabian, thought to be due to epididymal cysts    Thoracic aortic aneurysm ([place]) 04/18/2017   VARICOCELE 02/17/2010   Qualifier: Diagnosis of  By: Fuller Plan CMA Deborra Medina), [name]      Past Surgical History:  Procedure Laterality Date   COLONOSCOPY     COLONOSCOPY WITH PROPOFOL  01-06-2014   POLYPECTOMY   CYSTOSCOPY WITH RETROGRADE PYELOGRAM, URETEROSCOPY AND STENT PLACEMENT Left 04/19/2014   Procedure: CYSTOSCOPY WITH RETROGRADE PYELOGRAM with interpretation, URETEROSCOPY, stone extraction with basket, AND STENT PLACEMENT, implantation of backstop;  Surgeon: Ailene Rud, MD;  Location: [facility];  Service: Urology;  Laterality: Left;   EXCISION SEBACEOUS CYST X3 OF BACK  03-13-2010   HOLMIUM LASER APPLICATION Left 26/94/8546   Procedure: HOLMIUM LASER APPLICATION;  Surgeon: Ailene Rud, MD;  Location: [facility];  Service: Urology;  Laterality: Left;   NASAL SEPTUM SURGERY  09-21-2002   POLYPECTOMY     REFRACTIVE SURGERY  1994     Current Outpatient Medications  Medication Sig Dispense Refill   acetaminophen (TYLENOL) 500 MG tablet Take 500 mg by mouth every 6 (six) hours as needed.     aspirin 81 MG tablet Take 81 mg by mouth daily.     atorvastatin (LIPITOR) 20 MG tablet TAKE 1 TABLET BY MOUTH EVERY DAY (Patient taking differently: Take 10 mg by mouth daily.) 90 tablet 1   Bempedoic Acid-Ezetimibe (NEXLIZET) 180-10 MG TABS Take 1 tablet by mouth daily. 30 tablet 11   metoprolol succinate (TOPROL-XL) 25 MG 24 hr tablet Take 1 tablet (25 mg total) by mouth  daily. Please keep appt.in [place] in order to receive future refills. Thank You. 90 tablet 0   sildenafil (REVATIO) 20 MG tablet Take 3-5 tablets (60-100 mg total) by mouth daily as needed. 150 tablet 5   No current facility-administered medications for this visit.    Allergies:   Crestor [rosuvastatin calcium], Naproxen sodium, Penicillins, Repatha [evolocumab], and Erythromycin    Social History:  The patient  reports that he quit smoking about 13 years ago. His smoking use included cigarettes. He has a 51.00 pack-year smoking history. He has never used smokeless tobacco. He reports that he does not currently use alcohol. He reports that he does not use drugs.   Family History:  The patient's family history includes Alzheimer's  disease in his father; Cancer in his mother; Diabetes in his brother and another family member; Kidney disease in an other family member; Parkinsonism in his father.    ROS:  Please see the history of present illness.   Otherwise, review of systems are positive for chronic nausea.   All other systems are reviewed and negative.    PHYSICAL EXAM: VS:  BP 128/68   Pulse 78   Ht '6\' 2"'$  (1.88 m)   Wt 247 lb (112 kg)   SpO2 94%   BMI 31.71 kg/m  , BMI Body mass index is 31.71 kg/m. GEN: Well nourished, well developed, in no acute distress HEENT: normal Neck: no JVD, carotid bruits, or masses Cardiac: RRR; no murmurs, rubs, or gallops,no edema  Respiratory:  clear to auscultation bilaterally, normal work of breathing GI: soft, nontender, nondistended, + BS MS: no deformity or atrophy Skin: warm and dry, no rash Neuro:  Strength and sensation are intact Psych: euthymic mood, full affect   EKG:   The ekg ordered today demonstrates NSR, nonspecific ST changes, no change from 2022   Recent Labs: 03/07/2022: ALT 42; BUN 12; Creatinine, Ser 0.84; Potassium 4.4; Sodium 143   Lipid Panel    Component Value Date/Time   CHOL 102 03/07/2022 0741    TRIG 123 03/07/2022 0741   HDL 34 (L) 03/07/2022 0741   CHOLHDL 3.0 03/07/2022 0741   CHOLHDL 4 03/13/2021 0941   VLDL 25.2 03/13/2021 0941   LDLCALC 46 03/07/2022 0741   LDLDIRECT 162.0 03/06/2017 1719     Other studies Reviewed: Additional studies/ records that were reviewed today with results demonstrating: labs reviewed- LDL 46.   ASSESSMENT AND PLAN:  CAD: Mild CAD by prior CTA. No angina on medical therapy. Healthy lifestyle.   Thoracic aortic aneurysm: Annual imaging with Dr. Roxan Hockey. Mild AI: No CHF symptoms. Hyperlipidemia: Statin along with nexLizet to control lipids.  Whole food, plant-based diet recommended.  High-fiber diet.  Avoid processed foods.  Has been on lower dose of atorvastatin for 5 weeks. Will recheck lipids in anouther 4 weeks to ensure that LDL still controlled.    Current medicines are reviewed at length with the patient today.  The patient concerns regarding his medicines were addressed.  The following changes have been made:  No change  Labs/ tests ordered today include:   Orders Placed This Encounter  Procedures   Lipid Profile   Hepatic function panel   EKG 12-Lead    Recommend 150 minutes/week of aerobic exercise Low fat, low carb, high fiber diet recommended  Disposition:   FU in 1 year   Signed, Larae Grooms, MD  07/26/2022 4:31 PM    [facility] [place], [place], [place]  [address] Phone: [phone]; Fax: [phone]

## 2022-07-26 ENCOUNTER — Encounter: Payer: Self-pay | Admitting: Interventional Cardiology

## 2022-07-26 ENCOUNTER — Ambulatory Visit: Payer: BC Managed Care – PPO | Attending: Interventional Cardiology | Admitting: Interventional Cardiology

## 2022-07-26 VITALS — BP 128/68 | HR 78 | Ht 74.0 in | Wt 247.0 lb

## 2022-07-26 DIAGNOSIS — I351 Nonrheumatic aortic (valve) insufficiency: Secondary | ICD-10-CM

## 2022-07-26 DIAGNOSIS — E782 Mixed hyperlipidemia: Secondary | ICD-10-CM

## 2022-07-26 DIAGNOSIS — I25118 Atherosclerotic heart disease of native coronary artery with other forms of angina pectoris: Secondary | ICD-10-CM

## 2022-07-26 DIAGNOSIS — E785 Hyperlipidemia, unspecified: Secondary | ICD-10-CM | POA: Diagnosis not present

## 2022-07-26 NOTE — Patient Instructions (Addendum)
Medication Instructions:  Your physician recommends that you continue on your current medications as directed. Please refer to the Current Medication list given to you today.  *If you need a refill on your cardiac medications before your next appointment, please call your pharmacy*   Lab Work: Your physician recommends that you return for lab work on August 24, 2022--Lipid and liver profiles.  This will be fasting.  The lab opens at 7:15 AM  If you have labs (blood work) drawn today and your tests are completely normal, you will receive your results only by: [place] (if you have MyChart) OR A paper copy in the mail If you have any lab test that is abnormal or we need to change your treatment, we will call you to review the results.   Testing/Procedures: none   Follow-Up: At [facility], you and your health needs are our priority.  As part of our continuing mission to provide you with exceptional heart care, we have created designated Provider Care Teams.  These Care Teams include your primary Cardiologist (physician) and Advanced Practice Providers (APPs -  Physician Assistants and Nurse Practitioners) who all work together to provide you with the care you need, when you need it.  We recommend signing up for the patient portal called "MyChart".  Sign up information is provided on this After Visit Summary.  MyChart is used to connect with patients for Virtual Visits (Telemedicine).  Patients are able to view lab/test results, encounter notes, upcoming appointments, etc.  Non-urgent messages can be sent to your provider as well.   To learn more about what you can do with MyChart, go to [email or web address].    Your next appointment:   12 month(s)  Provider:   Larae Grooms, MD     Other Instructions

## 2022-07-30 ENCOUNTER — Other Ambulatory Visit: Payer: Self-pay | Admitting: Interventional Cardiology

## 2022-08-24 ENCOUNTER — Ambulatory Visit: Payer: BC Managed Care – PPO | Attending: Interventional Cardiology

## 2022-08-24 DIAGNOSIS — E782 Mixed hyperlipidemia: Secondary | ICD-10-CM

## 2022-08-25 LAB — LIPID PANEL
Chol/HDL Ratio: 2.9 ratio (ref 0.0–5.0)
Cholesterol, Total: 103 mg/dL (ref 100–199)
HDL: 35 mg/dL — ABNORMAL LOW (ref >39)
LDL Chol Calc (NIH): 48 mg/dL (ref 0–99)
Triglycerides: 104 mg/dL (ref 0–149)
VLDL Cholesterol Cal: 20 mg/dL (ref 5–40)

## 2022-08-25 LAB — HEPATIC FUNCTION PANEL
ALT: 42 IU/L (ref 0–44)
AST: 54 IU/L — ABNORMAL HIGH (ref 0–40)
Albumin: 4.5 g/dL (ref 3.9–4.9)
Alkaline Phosphatase: 105 IU/L (ref 44–121)
Bilirubin Total: 0.7 mg/dL (ref 0.0–1.2)
Bilirubin, Direct: 0.23 mg/dL (ref 0.00–0.40)
Total Protein: 6.9 g/dL (ref 6.0–8.5)

## 2022-10-16 ENCOUNTER — Encounter: Payer: Self-pay | Admitting: Family Medicine

## 2022-10-16 ENCOUNTER — Ambulatory Visit (INDEPENDENT_AMBULATORY_CARE_PROVIDER_SITE_OTHER)
Admission: RE | Admit: 2022-10-16 | Discharge: 2022-10-16 | Disposition: A | Discharge status: Home / Self Care | Payer: BC Managed Care – PPO | Source: Ambulatory Visit | Attending: Family Medicine | Admitting: Family Medicine

## 2022-10-16 ENCOUNTER — Ambulatory Visit (INDEPENDENT_AMBULATORY_CARE_PROVIDER_SITE_OTHER): Payer: BC Managed Care – PPO | Admitting: Family Medicine

## 2022-10-16 VITALS — BP 120/82 | HR 69 | Temp 97.8°F | Ht 74.0 in | Wt 245.0 lb

## 2022-10-16 DIAGNOSIS — R110 Nausea: Secondary | ICD-10-CM | POA: Diagnosis not present

## 2022-10-16 DIAGNOSIS — R195 Other fecal abnormalities: Secondary | ICD-10-CM | POA: Diagnosis not present

## 2022-10-16 DIAGNOSIS — R739 Hyperglycemia, unspecified: Secondary | ICD-10-CM | POA: Diagnosis not present

## 2022-10-16 DIAGNOSIS — M19011 Primary osteoarthritis, right shoulder: Secondary | ICD-10-CM | POA: Diagnosis not present

## 2022-10-16 DIAGNOSIS — M25511 Pain in right shoulder: Secondary | ICD-10-CM | POA: Diagnosis not present

## 2022-10-16 DIAGNOSIS — E785 Hyperlipidemia, unspecified: Secondary | ICD-10-CM | POA: Diagnosis not present

## 2022-10-16 DIAGNOSIS — M25519 Pain in unspecified shoulder: Secondary | ICD-10-CM

## 2022-10-16 DIAGNOSIS — H9312 Tinnitus, left ear: Secondary | ICD-10-CM

## 2022-10-16 LAB — COMPREHENSIVE METABOLIC PANEL
ALT: 52 U/L (ref 0–53)
AST: 68 U/L — ABNORMAL HIGH (ref 0–37)
Albumin: 4.5 g/dL (ref 3.5–5.2)
Alkaline Phosphatase: 108 U/L (ref 39–117)
BUN: 11 mg/dL (ref 6–23)
CO2: 28 mEq/L (ref 19–32)
Calcium: 9.5 mg/dL (ref 8.4–10.5)
Chloride: 105 mEq/L (ref 96–112)
Creatinine, Ser: 0.79 mg/dL (ref 0.40–1.50)
GFR: 94.71 mL/min (ref >60.00)
Glucose, Bld: 74 mg/dL (ref 70–99)
Potassium: 4.5 mEq/L (ref 3.5–5.1)
Sodium: 139 mEq/L (ref 135–145)
Total Bilirubin: 0.5 mg/dL (ref 0.2–1.2)
Total Protein: 7.4 g/dL (ref 6.0–8.3)

## 2022-10-16 LAB — CBC WITH DIFFERENTIAL/PLATELET
Basophils Absolute: 0.1 10*3/uL (ref 0.0–0.1)
Basophils Relative: 0.8 % (ref 0.0–3.0)
Eosinophils Absolute: 0.4 10*3/uL (ref 0.0–0.7)
Eosinophils Relative: 5.7 % — ABNORMAL HIGH (ref 0.0–5.0)
HCT: 41.4 % (ref 39.0–52.0)
Hemoglobin: 13.8 g/dL (ref 13.0–17.0)
Lymphocytes Relative: 44.4 % (ref 12.0–46.0)
Lymphs Abs: 3.2 10*3/uL (ref 0.7–4.0)
MCHC: 33.2 g/dL (ref 30.0–36.0)
MCV: 95.2 fl (ref 78.0–100.0)
Monocytes Absolute: 0.6 10*3/uL (ref 0.1–1.0)
Monocytes Relative: 7.8 % (ref 3.0–12.0)
Neutro Abs: 3 10*3/uL (ref 1.4–7.7)
Neutrophils Relative %: 41.3 % — ABNORMAL LOW (ref 43.0–77.0)
Platelets: 174 10*3/uL (ref 150.0–400.0)
RBC: 4.35 Mil/uL (ref 4.22–5.81)
RDW: 13.7 % (ref 11.5–15.5)
WBC: 7.3 10*3/uL (ref 4.0–10.5)

## 2022-10-16 LAB — HEMOGLOBIN A1C: Hgb A1c MFr Bld: 6 % (ref 4.6–6.5)

## 2022-10-16 LAB — TSH: TSH: 1.59 u[IU]/mL (ref 0.35–5.50)

## 2022-10-16 MED ORDER — ATORVASTATIN CALCIUM 20 MG PO TABS: 10.0000 mg | ORAL_TABLET | Freq: Every day | ORAL | Status: DC

## 2022-10-16 NOTE — Patient Instructions (Addendum)
Go to the lab on the way out.   If you have mychart we'll likely use that to update you.    Take care.  Glad to see you. Likely need to see ortho.  Use advil with food for now.

## 2022-10-16 NOTE — Progress Notes (Unsigned)
B but R>L shoulder pain.  No change in pain with change in statin.  Symptoms going on for approximately 6 months.  No trigger, no trauma.  Pain sleeping on side or with overhead movement.   L ear tinnitus, gradually getting worse, now constant.  Family noted hearing changes.  See below.  He'll update me if he wants to go to ENT.  D/w pt about noise masking and ear protection.    Nausea noted over the last few months with eating.  Only noted after eating.  Then gets better after about 1 hour.  No sig weight loss.  No vomiting.  No heartburn.  Was prev on prilosec and heartburn resolved.  Then was able to get off medicine.  No blood in vomitus.  No black stools.  No red blood in stools.  Stools are lighter brown than prior, slightly yellowish. He started taking some metamucil and had greasier stools at the time.  No abd pain.    Meds, vitals, and allergies reviewed.   ROS: Per HPI unless specifically indicated in ROS section   Nad Ncat Neck supple, no LA Air>bone on L ear with Weber louder on the R.  Intact whisper R ear but not on L (ie decreased hearing sensitivity in the left) TM wnl  Rrr Ctab R shoulder pain with int/ext rotation.  Distally neurovascularly intact. Abdomen soft.  Nontender.  Normal bowel sounds.  No jaundice. No BLE edema.   30 minutes were devoted to patient care in this encounter (this includes time spent reviewing the patient's file/history, interviewing and examining the patient, counseling/reviewing plan with patient).

## 2022-10-17 DIAGNOSIS — H9319 Tinnitus, unspecified ear: Secondary | ICD-10-CM | POA: Insufficient documentation

## 2022-10-17 DIAGNOSIS — M25519 Pain in unspecified shoulder: Secondary | ICD-10-CM | POA: Insufficient documentation

## 2022-10-17 DIAGNOSIS — R110 Nausea: Secondary | ICD-10-CM | POA: Insufficient documentation

## 2022-10-17 NOTE — Assessment & Plan Note (Signed)
Suspect rotator cuff pathology.  Check plain films today and likely refer to orthopedics.  See notes on imaging.

## 2022-10-17 NOTE — Assessment & Plan Note (Signed)
Of unclear source.  Noted over the last few months with eating.  No significant weight loss but he does have stool changes.  Discussed checking routine labs and also stool studies to evaluate for malabsorptive process.  Benign abdominal exam and at this point okay for outpatient follow-up.  He agrees with plan.

## 2022-10-17 NOTE — Assessment & Plan Note (Signed)
With decreased hearing sensitivity on left side but unremarkable tympanic membrane exam.  Discussed options.  Offered ENT eval.  He wants to consider for now.  Both his hearing loss and his tinnitus may be related to previous noise exposure.

## 2022-10-18 ENCOUNTER — Other Ambulatory Visit: Payer: BC Managed Care – PPO

## 2022-10-18 ENCOUNTER — Other Ambulatory Visit: Payer: Self-pay | Admitting: *Deleted

## 2022-10-18 ENCOUNTER — Encounter: Payer: Self-pay | Admitting: *Deleted

## 2022-10-18 DIAGNOSIS — R195 Other fecal abnormalities: Secondary | ICD-10-CM | POA: Diagnosis not present

## 2022-10-18 DIAGNOSIS — R110 Nausea: Secondary | ICD-10-CM | POA: Diagnosis not present

## 2022-10-19 ENCOUNTER — Telehealth: Payer: Self-pay | Admitting: Family Medicine

## 2022-10-19 NOTE — Telephone Encounter (Signed)
Called patient to notify him that his CD is ready for pickup at his convenience.  He said he will probably come by on Monday to pick it up.

## 2022-10-19 NOTE — Telephone Encounter (Signed)
Patient would like a cd of xrays he received on his shoulder on 10/16/2022.He would like a call once its ready to be picked up.

## 2022-10-22 LAB — FECAL FAT, QUALITATIVE: FECAL FAT, QUALITATIVE: NORMAL

## 2022-10-24 LAB — PANCREATIC ELASTASE, FECAL: Pancreatic Elastase-1, Stool: 328 mcg/g

## 2022-10-25 DIAGNOSIS — M7541 Impingement syndrome of right shoulder: Secondary | ICD-10-CM | POA: Diagnosis not present

## 2022-10-25 DIAGNOSIS — M19011 Primary osteoarthritis, right shoulder: Secondary | ICD-10-CM | POA: Diagnosis not present

## 2022-10-31 ENCOUNTER — Telehealth: Payer: Self-pay

## 2022-10-31 NOTE — Telephone Encounter (Signed)
-----   Message from [facility], DO sent at 10/31/2022  8:22 AM EDT ----- Can you please schedule OV with me or one of the APP's for this patient.  Thank you. ----- Message ----- From: Joaquim Nam, MD Sent: 10/28/2022  10:23 PM EDT To: Shellia Cleverly, DO; Albertine Patricia  Please update patient.  I would like him to see the GI clinic.  I routed this to Dr. Barron Alvine for his input in the meantime.  Please have the patient let me know if he is not able to get an appointment.  Dr. Amanda Cockayne have your staff contact this patient about getting a follow-up appointment in your clinic.  I greatly appreciate your help.

## 2022-10-31 NOTE — Telephone Encounter (Signed)
Scheduled OV with patient on 01/08/23 at 10:00 AM with Doug Sou, PA.

## 2022-10-31 NOTE — Telephone Encounter (Signed)
Noted. Thanks.

## 2022-11-22 DIAGNOSIS — M19011 Primary osteoarthritis, right shoulder: Secondary | ICD-10-CM | POA: Diagnosis not present

## 2022-11-22 DIAGNOSIS — M7541 Impingement syndrome of right shoulder: Secondary | ICD-10-CM | POA: Diagnosis not present

## 2022-11-29 ENCOUNTER — Other Ambulatory Visit: Payer: Self-pay | Admitting: Thoracic Surgery (Cardiothoracic Vascular Surgery)

## 2022-11-29 DIAGNOSIS — I712 Thoracic aortic aneurysm, without rupture, unspecified: Secondary | ICD-10-CM

## 2022-12-10 ENCOUNTER — Telehealth: Payer: Self-pay

## 2022-12-10 ENCOUNTER — Telehealth: Payer: Self-pay | Admitting: Pharmacist

## 2022-12-10 ENCOUNTER — Other Ambulatory Visit (HOSPITAL_COMMUNITY): Payer: Self-pay

## 2022-12-10 NOTE — Telephone Encounter (Signed)
Pharmacy Patient Advocate Encounter   Received notification from [facility] that prior authorization for NEXLIZET 180-10 TAB is needed.    PA submitted on 12/10/22 Key [id number] Status is pending  Haze Rushing, CPhT Pharmacy Patient Advocate Specialist Direct Number: [phone] Fax: [phone]

## 2022-12-10 NOTE — Telephone Encounter (Signed)
PA sent to plan, please see separate encounter for updates on determination. (I will route you back in once a decision has been made)  Zenita Kister, CPhT Pharmacy Patient Advocate Specialist Direct Number: [phone] Fax: [phone]  

## 2022-12-10 NOTE — Telephone Encounter (Signed)
Nexlizet PA renewal requested

## 2022-12-12 NOTE — Telephone Encounter (Signed)
Pharmacy Patient Advocate Encounter  Prior Authorization for NEXLIZET has been approved.    Effective dates: 12/10/22 through 12/10/23  Haze Rushing, CPhT Pharmacy Patient Advocate Specialist Direct Number: [phone] Fax: [phone]

## 2022-12-13 NOTE — Telephone Encounter (Signed)
Inform wife about PA renewal approval.

## 2022-12-20 DIAGNOSIS — M25511 Pain in right shoulder: Secondary | ICD-10-CM | POA: Diagnosis not present

## 2022-12-20 DIAGNOSIS — M7541 Impingement syndrome of right shoulder: Secondary | ICD-10-CM | POA: Diagnosis not present

## 2022-12-20 DIAGNOSIS — M19011 Primary osteoarthritis, right shoulder: Secondary | ICD-10-CM | POA: Diagnosis not present

## 2023-01-01 ENCOUNTER — Other Ambulatory Visit: Payer: Self-pay

## 2023-01-01 MED ORDER — NEXLIZET 180-10 MG PO TABS: 1.0000 | ORAL_TABLET | Freq: Every day | ORAL | 2 refills | Status: DC

## 2023-01-01 NOTE — Telephone Encounter (Signed)
Pt's medication was sent to pt's pharmacy as requested. Confirmation received.  °

## 2023-01-08 ENCOUNTER — Encounter: Payer: Self-pay | Admitting: Gastroenterology

## 2023-01-08 ENCOUNTER — Ambulatory Visit: Payer: BC Managed Care – PPO | Admitting: Gastroenterology

## 2023-01-08 VITALS — BP 128/76 | HR 72 | Ht 74.0 in | Wt 243.4 lb

## 2023-01-08 DIAGNOSIS — R110 Nausea: Secondary | ICD-10-CM

## 2023-01-08 MED ORDER — PANTOPRAZOLE SODIUM 40 MG PO TBEC: 40.0000 mg | DELAYED_RELEASE_TABLET | Freq: Every day | ORAL | 2 refills | Status: DC

## 2023-01-08 NOTE — Progress Notes (Signed)
01/08/2023 Jet Armbrust Helvey [id number] [birth year]   HISTORY OF PRESENT ILLNESS:  This is a 62 year old male who is a patient of Dr. Frankey Shown.  He is here today for evaluation of complaints of nausea.  He says that this has been occurring for about the past 8 to 12 months.  Starts getting nauseous about 30 minutes before eating and then very nauseous for up to couple of hours after eating.  No other associated symptoms.  Denies any significant heartburn or reflux, but does take an occasional Tums.  Some gastritis on EGD in 2018.  He says that when the nausea occurs his lips also feel very weird and his mouth starts watering.  Denies abdominal pain or weight loss.  He has been on meloxicam regularly, but only over the past 3 months.  He started Nexlizet about a year ago, but looking at the side effect profile nausea is not really listed.  PCP checked CBC, CMP, TSH, fecal elastase, and fecal fat back in April and all those were unremarkable.  He denies having diarrhea, however.   Past Medical History:  Diagnosis Date   Allergy    At risk for sleep apnea    STOP-BANG= 4     SENT TO PCP 04-15-2014   BRBPR (bright red blood per rectum) 07/14/2015   ED (erectile dysfunction) 03/10/2013   Epididymal cyst    LEFT   GERD (gastroesophageal reflux disease)    Heart murmur mild mvp  -- asymptomatic   per pt echo 2005   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 Wanda Plump with [facility] for benign noduarity in left hemiscrotum (2008) felt to be variant of normal.   Left nephrolithiasis    Testicular pain 12/01/2010   Per Dr. Patsi Sears, thought to be due to epididymal cysts    Thoracic aortic aneurysm ([facility]) 04/18/2017   VARICOCELE 02/17/2010   Qualifier: Diagnosis of  By: Etheleen Mayhew CMA Duncan Dull), [name]     Past Surgical History:  Procedure  Laterality Date   COLONOSCOPY     COLONOSCOPY WITH PROPOFOL  01-06-2014   POLYPECTOMY   CYSTOSCOPY WITH RETROGRADE PYELOGRAM, URETEROSCOPY AND STENT PLACEMENT Left 04/19/2014   Procedure: CYSTOSCOPY WITH RETROGRADE PYELOGRAM with interpretation, URETEROSCOPY, stone extraction with basket, AND STENT PLACEMENT, implantation of backstop;  Surgeon: Kathi Ludwig, MD;  Location: [facility];  Service: Urology;  Laterality: Left;   EXCISION SEBACEOUS CYST X3 OF BACK  03-13-2010   HOLMIUM LASER APPLICATION Left 04/19/2014   Procedure: HOLMIUM LASER APPLICATION;  Surgeon: Kathi Ludwig, MD;  Location: [facility];  Service: Urology;  Laterality: Left;   NASAL SEPTUM SURGERY  09-21-2002   POLYPECTOMY     REFRACTIVE SURGERY  1994    reports that he quit smoking about 14 years ago. His smoking use included cigarettes. He started smoking about 48 years ago. He has a 51 pack-year smoking history. He has never used smokeless tobacco. He reports that he does not currently use alcohol. He reports that he does not use drugs. family history includes Alzheimer's disease in his father; Cancer in his mother; Diabetes in his brother and another family member; Kidney disease in an other family member; Parkinsonism in his father. Allergies  Allergen Reactions  Crestor [Rosuvastatin Calcium] Other (See Comments)    Irritable.     Naproxen Sodium Swelling   Penicillins Swelling   Repatha [Evolocumab]     Elevated sugar, fatigue, dry mouth.    Erythromycin Rash      Outpatient Encounter Medications as of 01/08/2023  Medication Sig   acetaminophen (TYLENOL) 500 MG tablet Take 500 mg by mouth every 6 (six) hours as needed.   aspirin 81 MG tablet Take 81 mg by mouth daily.   Bempedoic Acid-Ezetimibe (NEXLIZET) 180-10 MG TABS Take 1 tablet by mouth daily.   meloxicam (MOBIC) 15 MG tablet Take 15 mg by mouth daily.   metoprolol succinate (TOPROL-XL) 25 MG 24 hr tablet  Take 1 tablet (25 mg total) by mouth daily.   sildenafil (REVATIO) 20 MG tablet Take 3-5 tablets (60-100 mg total) by mouth daily as needed.   atorvastatin (LIPITOR) 20 MG tablet Take 0.5 tablets (10 mg total) by mouth daily. (Patient not taking: Reported on 01/08/2023)   No facility-administered encounter medications on file as of 01/08/2023.    REVIEW OF SYSTEMS  : All other systems reviewed and negative except where noted in the History of Present Illness.   PHYSICAL EXAM: BP 128/76 (BP Location: Left Arm, Patient Position: Sitting, Cuff Size: Normal)   Pulse 72   Ht 6\' 2"  (1.88 m)   Wt 243 lb 6 oz (110.4 kg)   SpO2 96%   BMI 31.25 kg/m  General: Well developed white male in no acute distress Head: Normocephalic and atraumatic Eyes:  Sclerae anicteric, conjunctiva pink. Ears: Normal acuity. Lungs: Clear throughout to auscultation; no W/R/R. Heart: Regular rate and rhythm; no M/R/G. Abdomen: Soft, non-distended.  BS present.  Non-tender. Musculoskeletal: Symmetrical with no gross deformities  Skin: No lesions on visible extremities Extremities: No edema  Neurological: Alert oriented x 4, grossly non-focal Psychological:  Alert and cooperative. Normal mood and affect  ASSESSMENT AND PLAN: *Nausea: Has been occurring for about the past 8 to 12 months.  Starts getting nauseous about 30 minutes before eating and then very nauseous for up to couple of hours after eating.  No other associated symptoms.  Denies any significant heartburn or reflux, but does take an occasional Tums.  Some gastritis on EGD in 2018.  He is to try PPI again once daily for the next month or so to see if that helps.  Prescription for pantoprazole sent to pharmacy.  He will get back with [place] with an update in about a month and based on symptoms can consider repeat EGD.  Otherwise he was advised that nausea alone is difficult could be many other non-GI related things as well.   CC:  Joaquim Nam, MD

## 2023-01-08 NOTE — Patient Instructions (Signed)
We have sent the following medications to your pharmacy for you to pick up at your convenience: Pantoprazole 40 mg daily 30-60 minutes before breakfast.   Send message via Mychart in a month with an update. _______________________________________________________  If your blood pressure at your visit was 140/90 or greater, please contact your primary care physician to follow up on this.  _______________________________________________________  If you are age 15 or older, your body mass index should be between 23-30. Your Body mass index is 31.25 kg/m. If this is out of the aforementioned range listed, please consider follow up with your Primary Care Provider.  If you are age 36 or younger, your body mass index should be between 19-25. Your Body mass index is 31.25 kg/m. If this is out of the aformentioned range listed, please consider follow up with your Primary Care Provider.   ________________________________________________________  The [place] [facility] providers would like to encourage you to use [facility] to communicate with providers for non-urgent requests or questions.  Due to long hold times on the telephone, sending your provider a message by [facility] may be a faster and more efficient way to get a response.  Please allow 48 business hours for a response.  Please remember that this is for non-urgent requests.  _______________________________________________________

## 2023-01-12 ENCOUNTER — Other Ambulatory Visit (HOSPITAL_COMMUNITY): Payer: Self-pay

## 2023-01-12 ENCOUNTER — Telehealth: Payer: Self-pay | Admitting: Pharmacy Technician

## 2023-01-12 NOTE — Telephone Encounter (Signed)
Pharmacy Patient Advocate Encounter   Received notification from CoverMyMeds that prior authorization for PANTOPRAZOLE 40MG  is required/requested.   Insurance verification completed.   The patient is insured through [facility] .   Per test claim: [place] submitted to [employer] via CoverMyMeds Key/confirmation #/EOC [facility] Status is pending

## 2023-01-14 ENCOUNTER — Emergency Department (HOSPITAL_COMMUNITY): Payer: BC Managed Care – PPO

## 2023-01-14 ENCOUNTER — Telehealth: Payer: Self-pay | Admitting: Gastroenterology

## 2023-01-14 ENCOUNTER — Encounter (HOSPITAL_COMMUNITY): Payer: Self-pay

## 2023-01-14 ENCOUNTER — Other Ambulatory Visit: Payer: Self-pay

## 2023-01-14 ENCOUNTER — Emergency Department (HOSPITAL_COMMUNITY)
Admission: EM | Admit: 2023-01-14 | Discharge: 2023-01-14 | Disposition: A | Discharge status: Home / Self Care | Payer: BC Managed Care – PPO | Attending: Emergency Medicine | Admitting: Emergency Medicine

## 2023-01-14 DIAGNOSIS — R519 Headache, unspecified: Secondary | ICD-10-CM | POA: Diagnosis not present

## 2023-01-14 DIAGNOSIS — K922 Gastrointestinal hemorrhage, unspecified: Secondary | ICD-10-CM | POA: Diagnosis not present

## 2023-01-14 DIAGNOSIS — R195 Other fecal abnormalities: Secondary | ICD-10-CM

## 2023-01-14 DIAGNOSIS — I7121 Aneurysm of the ascending aorta, without rupture: Secondary | ICD-10-CM | POA: Diagnosis not present

## 2023-01-14 DIAGNOSIS — R55 Syncope and collapse: Secondary | ICD-10-CM | POA: Insufficient documentation

## 2023-01-14 DIAGNOSIS — R599 Enlarged lymph nodes, unspecified: Secondary | ICD-10-CM | POA: Diagnosis not present

## 2023-01-14 DIAGNOSIS — N200 Calculus of kidney: Secondary | ICD-10-CM | POA: Diagnosis not present

## 2023-01-14 DIAGNOSIS — R160 Hepatomegaly, not elsewhere classified: Secondary | ICD-10-CM | POA: Diagnosis not present

## 2023-01-14 DIAGNOSIS — K921 Melena: Secondary | ICD-10-CM | POA: Diagnosis not present

## 2023-01-14 HISTORY — DX: Abdominal aortic aneurysm, without rupture, unspecified: I71.40

## 2023-01-14 LAB — URINALYSIS, ROUTINE W REFLEX MICROSCOPIC
Bilirubin Urine: NEGATIVE
Glucose, UA: NEGATIVE mg/dL
Hgb urine dipstick: NEGATIVE
Ketones, ur: 5 mg/dL — AB
Leukocytes,Ua: NEGATIVE
Nitrite: NEGATIVE
Protein, ur: NEGATIVE mg/dL
Specific Gravity, Urine: 1.034 — ABNORMAL HIGH (ref 1.005–1.030)
pH: 7 (ref 5.0–8.0)

## 2023-01-14 LAB — COMPREHENSIVE METABOLIC PANEL
ALT: 41 U/L (ref 0–44)
AST: 61 U/L — ABNORMAL HIGH (ref 15–41)
Albumin: 4.2 g/dL (ref 3.5–5.0)
Alkaline Phosphatase: 82 U/L (ref 38–126)
Anion gap: 10 (ref 5–15)
BUN: 32 mg/dL — ABNORMAL HIGH (ref 8–23)
CO2: 23 mmol/L (ref 22–32)
Calcium: 9.6 mg/dL (ref 8.9–10.3)
Chloride: 105 mmol/L (ref 98–111)
Creatinine, Ser: 0.76 mg/dL (ref 0.61–1.24)
GFR, Estimated: >60 mL/min (ref >60)
Glucose, Bld: 119 mg/dL — ABNORMAL HIGH (ref 70–99)
Potassium: 4.4 mmol/L (ref 3.5–5.1)
Sodium: 138 mmol/L (ref 135–145)
Total Bilirubin: 0.9 mg/dL (ref 0.3–1.2)
Total Protein: 7.4 g/dL (ref 6.5–8.1)

## 2023-01-14 LAB — CBC
HCT: 40.3 % (ref 39.0–52.0)
Hemoglobin: 13.4 g/dL (ref 13.0–17.0)
MCH: 32.1 pg (ref 26.0–34.0)
MCHC: 33.3 g/dL (ref 30.0–36.0)
MCV: 96.6 fL (ref 80.0–100.0)
Platelets: 208 10*3/uL (ref 150–400)
RBC: 4.17 MIL/uL — ABNORMAL LOW (ref 4.22–5.81)
RDW: 13.2 % (ref 11.5–15.5)
WBC: 9.9 10*3/uL (ref 4.0–10.5)
nRBC: 0 % (ref 0.0–0.2)

## 2023-01-14 LAB — I-STAT CG4 LACTIC ACID, ED
Lactic Acid, Venous: 3.1 mmol/L (ref 0.5–1.9)
Lactic Acid, Venous: 1.8 mmol/L (ref 0.5–1.9)

## 2023-01-14 LAB — POC OCCULT BLOOD, ED: Fecal Occult Bld: NEGATIVE

## 2023-01-14 LAB — LIPASE, BLOOD: Lipase: 33 U/L (ref 11–51)

## 2023-01-14 LAB — I-STAT CHEM 8, ED
BUN: 42 mg/dL — ABNORMAL HIGH (ref 8–23)
Calcium, Ion: 1.22 mmol/L (ref 1.15–1.40)
Chloride: 106 mmol/L (ref 98–111)
Creatinine, Ser: 0.9 mg/dL (ref 0.61–1.24)
Glucose, Bld: 138 mg/dL — ABNORMAL HIGH (ref 70–99)
HCT: 37 % — ABNORMAL LOW (ref 39.0–52.0)
Hemoglobin: 12.6 g/dL — ABNORMAL LOW (ref 13.0–17.0)
Potassium: 4.8 mmol/L (ref 3.5–5.1)
Sodium: 140 mmol/L (ref 135–145)
TCO2: 23 mmol/L (ref 22–32)

## 2023-01-14 LAB — ABO/RH: ABO/RH(D): O POS

## 2023-01-14 LAB — TYPE AND SCREEN
ABO/RH(D): O POS
Antibody Screen: NEGATIVE

## 2023-01-14 LAB — TROPONIN I (HIGH SENSITIVITY)
Troponin I (High Sensitivity): 5 ng/L (ref <18)
Troponin I (High Sensitivity): 7 ng/L (ref <18)

## 2023-01-14 LAB — CBG MONITORING, ED: Glucose-Capillary: 139 mg/dL — ABNORMAL HIGH (ref 70–99)

## 2023-01-14 MED ORDER — PROCHLORPERAZINE EDISYLATE 10 MG/2ML IJ SOLN
10.0000 mg | Freq: Once | INTRAMUSCULAR | Status: AC
  Administered 2023-01-14: 10 mg via INTRAVENOUS
  Filled 2023-01-14: qty 2

## 2023-01-14 MED ORDER — SODIUM CHLORIDE 0.9 % IV BOLUS
1000.0000 mL | Freq: Once | INTRAVENOUS | Status: AC
  Administered 2023-01-14: 1000 mL via INTRAVENOUS

## 2023-01-14 MED ORDER — DIPHENHYDRAMINE HCL 50 MG/ML IJ SOLN
25.0000 mg | Freq: Once | INTRAMUSCULAR | Status: AC
  Administered 2023-01-14: 25 mg via INTRAVENOUS
  Filled 2023-01-14: qty 1

## 2023-01-14 MED ORDER — ONDANSETRON 4 MG PO TBDP
4.0000 mg | ORAL_TABLET | Freq: Once | ORAL | Status: AC
  Administered 2023-01-14: 4 mg via ORAL
  Filled 2023-01-14: qty 1

## 2023-01-14 MED ORDER — IOHEXOL 350 MG/ML SOLN
100.0000 mL | Freq: Once | INTRAVENOUS | Status: AC | PRN
  Administered 2023-01-14: 100 mL via INTRAVENOUS

## 2023-01-14 MED ORDER — ONDANSETRON 4 MG PO TBDP: 4.0000 mg | ORAL_TABLET | Freq: Three times a day (TID) | ORAL | 0 refills | Status: DC | PRN

## 2023-01-14 NOTE — ED Provider Notes (Signed)
Emergency Department Provider Note   I have reviewed the triage vital signs and the nursing notes.   HISTORY  Chief Complaint GI Bleeding and Nausea   HPI Steven [name] is a 62 y.o. male with PMH of AAA, GERD, HLD presents to the ED with black stools x 2 for the last 48 hours and epigastric abdominal pain this AM. Denies CP or SOB. Arrives with his wife who reports increased fatigue today. She was not told about the black stool today. He is not anticoagulated. No fever. No SOB.   In triage, patient had a syncope event and was immediately roomed for further, rapid evaluation.    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 Wanda Plump with URO for benign noduarity in left hemiscrotum (2008) felt to be variant of normal.   Left nephrolithiasis    Testicular pain 12/01/2010   Per Dr. Patsi Sears, thought to be due to epididymal cysts    Thoracic aortic aneurysm (HCC) 04/18/2017   VARICOCELE 02/17/2010   Qualifier: Diagnosis of  By: Etheleen Mayhew CMA (AAMA), [name]      Review of Systems  Constitutional: No fever/chills Cardiovascular: Denies chest pain. Respiratory: Denies shortness of breath. Gastrointestinal: Positive epigastric abdominal pain. Positive nausea, no vomiting.  No diarrhea. Positive black stool at home.  Genitourinary: Negative for dysuria. Musculoskeletal: Negative for back pain. Skin: Negative for rash. Neurological:  Negative for headaches.    ____________________________________________   PHYSICAL EXAM:  VITAL SIGNS: ED Triage Vitals  Encounter Vitals Group     BP 01/14/23 1502 139/87     Pulse Rate 01/14/23 1502 (!) 106     Resp 01/14/23 1502 16     Temp 01/14/23 1502 98.4 F (36.9 C)     Temp Source 01/14/23 1502 Oral     SpO2 01/14/23 1502 97 %   Constitutional: Drowsy appearing with diaphoresis. Warm to touch.  Eyes: Conjunctivae are normal.  Head: Atraumatic. Nose: No congestion/rhinnorhea. Mouth/Throat: Mucous membranes are dry.  Neck: No stridor.   Cardiovascular: Tachycardia. Good peripheral circulation. Grossly normal heart sounds.   Respiratory: Normal respiratory effort.  No retractions. Lungs CTAB. Gastrointestinal: Soft and nontender. No distention.  Musculoskeletal: No gross deformities of extremities. Neurologic:  Normal speech and language. No gross focal neurologic deficits are appreciated.  Skin:  Skin is warm, dry and intact. No rash noted.  ____________________________________________   LABS (all labs ordered are listed, but only abnormal results are displayed)  Labs Reviewed  I-STAT CHEM 8, ED - Abnormal; Notable for the following components:      Result Value   BUN 42 (*)    Glucose, Bld 138 (*)    Hemoglobin 12.6 (*)    HCT 37.0 (*)    All other components within normal limits  CBG MONITORING,  ED - Abnormal; Notable for the following components:   Glucose-Capillary 139 (*)    All other components within normal limits  CULTURE, BLOOD (ROUTINE X 2)  CULTURE, BLOOD (ROUTINE X 2)  COMPREHENSIVE METABOLIC PANEL  CBC  LIPASE, BLOOD  URINALYSIS, ROUTINE W REFLEX MICROSCOPIC  POC OCCULT BLOOD, ED  I-STAT CG4 LACTIC ACID, ED  TYPE AND SCREEN  ABO/RH  TROPONIN I (HIGH SENSITIVITY)   ____________________________________________  EKG   EKG Interpretation Date/Time:  Monday January 14 2023 15:42:58 EDT Ventricular Rate:  89 PR Interval:  149 QRS  Duration:  106 QT Interval:  389 QTC Calculation: 474 R Axis:   75  Text Interpretation: Sinus rhythm RSR' in V1 or V2, right VCD or RVH Minimal ST elevation, anterior leads Baseline wander in lead(s) II III aVR aVF Confirmed by Alona Bene [phone]) on 01/14/2023 3:51:41 PM        ____________________________________________  RADIOLOGY  No results found.  ____________________________________________   PROCEDURES  Procedure(s) performed:   Procedures  CRITICAL CARE Performed by: Maia Plan Total critical care time: 35 minutes Critical care time was exclusive of separately billable procedures and treating other patients. Critical care was necessary to treat or prevent imminent or life-threatening deterioration. Critical care was time spent personally by me on the following activities: development of treatment plan with patient and/or surrogate as well as nursing, discussions with consultants, evaluation of patient's response to treatment, examination of patient, obtaining history from patient or surrogate, ordering and performing treatments and interventions, ordering and review of laboratory studies, ordering and review of radiographic studies, pulse oximetry and re-evaluation of patient's condition.  Alona Bene, MD Emergency Medicine   EMERGENCY DEPARTMENT ULTRASOUND  Study: Limited Retroperitoneal Ultrasound of the Abdominal Aorta.  INDICATIONS:Abnormal vital signs, Pulsatile abdominal mass, and Abdominal pain Multiple views of the abdominal aorta were obtained in real-time from the diaphragmatic hiatus to the aortic bifurcation in transverse planes with a multi-frequency probe. PERFORMED BY: Myself FINDINGS: Free fluid absent LIMITATIONS:  Body habitus, Bowel gas, and Abdominal pain INTERPRETATION:  No abdominal aortic aneurysm   CPT Code: [phone] (limited retroperitoneal)  ____________________________________________   INITIAL IMPRESSION / ASSESSMENT AND  PLAN / ED COURSE  Pertinent labs & imaging results that were available during my care of the patient were reviewed by me and considered in my medical decision making (see chart for details).   This patient is Presenting for Evaluation of abdominal pain, which does require a range of treatment options, and is a complaint that involves a high risk of morbidity and mortality.  The Differential Diagnoses includes but is not exclusive to acute appendicitis, renal colic, testicular torsion, urinary tract infection, prostatitis,  diverticulitis, small bowel obstruction, colitis, abdominal aortic aneurysm, gastroenteritis, constipation etc.  Critical Interventions-    Medications  sodium chloride 0.9 % bolus 1,000 mL (1,000 mLs Intravenous New Bag/Given 01/14/23 1551)    Reassessment after intervention: BP and alertness improved.    I did obtain Additional Historical Information from wife at bedside.   Clinical Laboratory Tests Ordered, included lactic acid elevated. Occult blood negative. ***  Radiologic Tests Ordered, included ***. I independently interpreted the images and agree with radiology interpretation.   Cardiac Monitor Tracing which shows sinus tachycardia.    Social Determinants of Health Risk patient is a non-smoker.   Consult complete with  Medical Decision Making: Summary:    Reevaluation with update and discussion with   ***Considered admission***  Patient's presentation is most consistent with {EM COPA:27473}  Disposition:   ____________________________________________  FINAL CLINICAL IMPRESSION(S) / ED DIAGNOSES  Final diagnoses:  None     NEW OUTPATIENT MEDICATIONS STARTED DURING THIS VISIT:  New Prescriptions   No medications on file    Note:  This document was prepared using Dragon voice recognition software and may include unintentional dictation errors.  Alona Bene, MD, [facility] Emergency Medicine

## 2023-01-14 NOTE — ED Notes (Signed)
Pt brought back to ED room. Pt lethargic. Pt diaphoretic. MD at bedside.

## 2023-01-14 NOTE — Telephone Encounter (Signed)
Patient called requesting a call back to discuss him having some sharp rectal pain.

## 2023-01-14 NOTE — ED Triage Notes (Signed)
Pt c/o nausea x 2 months, worse today; Sunday black stools, again this am; started having pin prick feeling to face and neck; epigastric pressure; hx AAA; pt slightly diaphoretic in triage; alert, oriented, NAD

## 2023-01-14 NOTE — ED Notes (Signed)
Patient verbalizes understanding of discharge instructions. Opportunity for questioning and answers were provided. Armband removed by staff, pt discharged from ED. Pt taken to ED waiting room via wheel chair.  

## 2023-01-15 ENCOUNTER — Telehealth: Payer: Self-pay | Admitting: *Deleted

## 2023-01-15 ENCOUNTER — Other Ambulatory Visit (HOSPITAL_COMMUNITY): Payer: Self-pay

## 2023-01-15 LAB — CULTURE, BLOOD (ROUTINE X 2): Culture: NO GROWTH

## 2023-01-15 NOTE — Telephone Encounter (Signed)
Spoke with the patient. Provided instruction over the phone. Appointment with Verda Cumins, NP is cancelled for tomorrow. Approved by Mr. Oretha Milch, and PreCert is obtained. Patient verbalized understanding.

## 2023-01-15 NOTE — Addendum Note (Signed)
Addended by: Coletta Memos on: 01/15/2023 12:55 PM   Modules accepted: Orders

## 2023-01-15 NOTE — Telephone Encounter (Signed)
Pharmacy Patient Advocate Encounter  Received notification from [facility] that Prior Authorization for Pantoprazole Sodium 40MG  dr tablets has been APPROVED from 01-12-2023 to 01-12-2024. Ran test claim, Copay is $0.00 for either a 30 day or 90 day supply..Quantity approved 90 tablets per 90 days.  PA #/Case ID/Reference #: [id number]

## 2023-01-15 NOTE — Telephone Encounter (Signed)
Noted  

## 2023-01-15 NOTE — Telephone Encounter (Signed)
PT wife is returning call about symptoms. He was in ED last night for a GI bleed and nausea. Advised that he would have an appointment today but nothing is available until October. Requesting call back to find out next steps. Please advise.

## 2023-01-15 NOTE — Progress Notes (Signed)
Agree with the assessment and plan as outlined by Doug Sou, PA-C.  Patient was actually seen in the ER last evening for dark stools.  Heme negative, otherwise normal CBC, CMP, lipase, troponin.  CTA performed and without any acute intra-abdominal pathology aside from known AAA.  Will schedule him for expedited EGD.  Alisen Marsiglia, DO, [facility]

## 2023-01-15 NOTE — Telephone Encounter (Signed)
The pt has been scheduled for appt tomorrow at 1030 am. The pt wife has been given the appt information and she thanked me for getting him in sooner

## 2023-01-15 NOTE — Telephone Encounter (Signed)
Spoke with patient's wife. Scheduled expedited EGD with Dr. Barron Alvine for tomorrow at 3;30 PM. Ambulatory referral is placed. Instruction sent via Mychart. Appointment with Alcide Evener will be cancelled.    Agree with the assessment and plan as outlined by Doug Sou, PA-C.   Patient was actually seen in the ER last evening for dark stools.  Heme negative, otherwise normal CBC, CMP, lipase, troponin.  CTA performed and without any acute intra-abdominal pathology aside from known AAA.  Will schedule him for expedited EGD.   Vito Cirigliano, DO, [facility]

## 2023-01-15 NOTE — Telephone Encounter (Signed)
Patient seen in ED 7/22 for syncopal episode, CTA chest done at that time. Advised wife CTA appt 7/30 at GI can be cancelled. Wife verbalized understanding.

## 2023-01-15 NOTE — Telephone Encounter (Signed)
Offered PT 7/31 @ 9am with Steffanie Dunn based on urgency. Still wants to be seen sooner and requesting call back from nurse.

## 2023-01-15 NOTE — Telephone Encounter (Signed)
Herbert Seta were you waiting on this?

## 2023-01-15 NOTE — ED Provider Notes (Signed)
  Physical Exam  BP 129/72   Pulse 97   Temp 98.8 F (37.1 C) (Oral)   Resp 17   SpO2 96%   Physical Exam Constitutional:      General: He is not in acute distress.    Appearance: Normal appearance.  HENT:     Head: Normocephalic and atraumatic.     Nose: No congestion or rhinorrhea.  Eyes:     General:        Right eye: No discharge.        Left eye: No discharge.     Extraocular Movements: Extraocular movements intact.     Pupils: Pupils are equal, round, and reactive to light.  Cardiovascular:     Rate and Rhythm: Normal rate and regular rhythm.     Heart sounds: No murmur heard. Pulmonary:     Effort: No respiratory distress.     Breath sounds: No wheezing or rales.  Abdominal:     General: There is no distension.     Tenderness: There is no abdominal tenderness.  Musculoskeletal:        General: Normal range of motion.     Cervical back: Normal range of motion.  Skin:    General: Skin is warm and dry.  Neurological:     General: No focal deficit present.     Mental Status: He is alert.     Procedures  Procedures  ED Course / MDM    Medical Decision Making Amount and/or Complexity of Data Reviewed Labs: ordered. Radiology: ordered.  Risk Prescription drug management.   Received in handoff.  Persistent nausea worse over the last few days.  Had a syncopal episode in the lobby with retching and arrives diaphoretic per previous provider.  Also had some dark stools over the last few days but Hemoccult negative.  Pending CT dissection scan at time of signout.  CT dissection scan shows a stable ascending thoracic aortic aneurysm with no evidence of leak, dissection or rupture.  On my reevaluation, patient is very well-appearing with minimal complaints outside of a mild headache.  I did expand his imaging to include a CT head to ensure were not missing intracranial pathology as a source of his nausea and this was reassuringly negative.  Given that the height of  patient's symptoms occurred right after an episode of significant retching, patient presentation likely consistent with vasovagal syncope especially in the setting of unremarkable laboratory workup, and given significant improvement of symptoms and no additional syncopal episodes here in the ER, he currently does not meet inpatient criteria for admission.  I did reach out to [facility] gastroenterology and spoke to Dr. Chales Abrahams who will help arrange outpatient EGD follow-up with the patient.  He has not trialed any antiemetics at home and I will give the patient ODT Zofran for home use.  I had a long discussion with multiple family members and the patient who voiced understanding of strict return precautions and patient was ultimately discharged with outpatient gastroenterology follow-up.       Glendora Score, MD 01/15/23 [phone]

## 2023-01-16 ENCOUNTER — Ambulatory Visit (AMBULATORY_SURGERY_CENTER): Payer: BC Managed Care – PPO | Admitting: Gastroenterology

## 2023-01-16 ENCOUNTER — Ambulatory Visit: Payer: BC Managed Care – PPO | Admitting: Nurse Practitioner

## 2023-01-16 ENCOUNTER — Encounter: Payer: Self-pay | Admitting: Gastroenterology

## 2023-01-16 VITALS — BP 118/68 | HR 77 | Temp 98.9°F | Resp 13 | Ht 74.0 in | Wt 243.0 lb

## 2023-01-16 DIAGNOSIS — R110 Nausea: Secondary | ICD-10-CM

## 2023-01-16 DIAGNOSIS — K259 Gastric ulcer, unspecified as acute or chronic, without hemorrhage or perforation: Secondary | ICD-10-CM

## 2023-01-16 DIAGNOSIS — R195 Other fecal abnormalities: Secondary | ICD-10-CM | POA: Diagnosis not present

## 2023-01-16 DIAGNOSIS — K319 Disease of stomach and duodenum, unspecified: Secondary | ICD-10-CM | POA: Diagnosis not present

## 2023-01-16 LAB — CULTURE, BLOOD (ROUTINE X 2): Special Requests: ADEQUATE

## 2023-01-16 MED ORDER — PANTOPRAZOLE SODIUM 40 MG PO TBEC: 40.0000 mg | DELAYED_RELEASE_TABLET | Freq: Two times a day (BID) | ORAL | 1 refills | Status: DC

## 2023-01-16 MED ORDER — SODIUM CHLORIDE 0.9 % IV SOLN: 500.0000 mL | INTRAVENOUS | Status: DC

## 2023-01-16 NOTE — Progress Notes (Signed)
Vss nad trans to pacu 

## 2023-01-16 NOTE — Patient Instructions (Signed)

## 2023-01-16 NOTE — Progress Notes (Signed)
GASTROENTEROLOGY PROCEDURE H&P NOTE   Primary Care Physician: Joaquim Nam, MD    Reason for Procedure:  Dark stools, nausea, history of gastritis  Plan:    EGD  Patient is appropriate for endoscopic procedure(s) in the ambulatory ([facility]) setting.  The nature of the procedure, as well as the risks, benefits, and alternatives were carefully and thoroughly reviewed with the patient. Ample time for discussion and questions allowed. The patient understood, was satisfied, and agreed to proceed.     HPI: Steven [name] is a 62 y.o. male who presents for EGD for evaluation of chronic, recurrent nausea and recent dark stools, along with history of gastritis.  Patient was most recently seen in the Gastroenterology Clinic on 01/08/2023 by Doug Sou, then evaluated in the ER on 01/14/2023.      ER evaluation on 7/22 was largely unremarkable to include normal CBC, CMP, lipase, lactic acid, troponin, and FOBT-.  CT angio notable for 4.6 x 4.8 cm ascending thoracic aortic aneurysm and a stable 13 mm portacaval lymph node (unchanged from previous imaging and no other lymphadenopathy noted), but otherwise unremarkable and no acute intra-abdominal pathology noted.  No interval change in medical history since that appointment. Please refer to that note for full details regarding GI history and clinical presentation.   Past Medical History:  Diagnosis Date   AAA (abdominal aortic aneurysm) (HCC)    Allergy    At risk for sleep apnea    STOP-BANG= 4     SENT TO PCP 04-15-2014   BRBPR (bright red blood per rectum) 07/14/2015   ED (erectile dysfunction) 03/10/2013   Epididymal cyst    LEFT   GERD (gastroesophageal reflux disease)    Heart murmur mild mvp  -- asymptomatic   per pt echo 2005   History of positive PPD    1997---  S/P  INH  Tx  for 6 months   Hx of adenomatous colonic polyps 04/01/2017   Hyperglycemia 03/10/2013   Hyperlipidemia    Hypogonadism male    Previously  evaluated by Dr. Wanda Plump with URO for benign noduarity in left hemiscrotum (2008) felt to be variant of normal.   Left nephrolithiasis    Testicular pain 12/01/2010   Per Dr. Patsi Sears, thought to be due to epididymal cysts    Thoracic aortic aneurysm (HCC) 04/18/2017   VARICOCELE 02/17/2010   Qualifier: Diagnosis of  By: Etheleen Mayhew CMA Duncan Dull), [name]      Past Surgical History:  Procedure Laterality Date   COLONOSCOPY     COLONOSCOPY WITH PROPOFOL  01-06-2014   POLYPECTOMY   CYSTOSCOPY WITH RETROGRADE PYELOGRAM, URETEROSCOPY AND STENT PLACEMENT Left 04/19/2014   Procedure: CYSTOSCOPY WITH RETROGRADE PYELOGRAM with interpretation, URETEROSCOPY, stone extraction with basket, AND STENT PLACEMENT, implantation of backstop;  Surgeon: Kathi Ludwig, MD;  Location: [facility];  Service: Urology;  Laterality: Left;   EXCISION SEBACEOUS CYST X3 OF BACK  03-13-2010   HOLMIUM LASER APPLICATION Left 04/19/2014   Procedure: HOLMIUM LASER APPLICATION;  Surgeon: Kathi Ludwig, MD;  Location: [facility];  Service: Urology;  Laterality: Left;   NASAL SEPTUM SURGERY  09-21-2002   POLYPECTOMY     REFRACTIVE SURGERY  1994    Prior to Admission medications   Medication Sig Start Date End Date Taking? Authorizing Provider  acetaminophen (TYLENOL) 500 MG tablet Take 500 mg by mouth every 6 (six) hours as needed.    [provider]  aspirin 81 MG tablet Take  81 mg by mouth daily.    [provider]  atorvastatin (LIPITOR) 20 MG tablet Take 0.5 tablets (10 mg total) by mouth daily. Patient not taking: Reported on 01/08/2023 10/16/22   Joaquim Nam, MD  Bempedoic Acid-Ezetimibe (NEXLIZET) 180-10 MG TABS Take 1 tablet by mouth daily. 01/01/23   Corky Crafts, MD  meloxicam (MOBIC) 15 MG tablet Take 15 mg by mouth daily. 12/05/22   [provider]  metoprolol succinate (TOPROL-XL) 25 MG 24 hr tablet Take 1 tablet (25 mg total) by  mouth daily. 07/31/22   Corky Crafts, MD  ondansetron (ZOFRAN-ODT) 4 MG disintegrating tablet Take 1 tablet (4 mg total) by mouth every 8 (eight) hours as needed for nausea or vomiting. 01/14/23   Kommor, Madison, MD  pantoprazole (PROTONIX) 40 MG tablet Take 1 tablet (40 mg total) by mouth daily. 01/08/23   Zehr, Shanda Bumps D, PA-C  sildenafil (REVATIO) 20 MG tablet Take 3-5 tablets (60-100 mg total) by mouth daily as needed. 10/11/21   Joaquim Nam, MD    Current Outpatient Medications  Medication Sig Dispense Refill   acetaminophen (TYLENOL) 500 MG tablet Take 500 mg by mouth every 6 (six) hours as needed.     aspirin 81 MG tablet Take 81 mg by mouth daily.     atorvastatin (LIPITOR) 20 MG tablet Take 0.5 tablets (10 mg total) by mouth daily. (Patient not taking: Reported on 01/08/2023)     Bempedoic Acid-Ezetimibe (NEXLIZET) 180-10 MG TABS Take 1 tablet by mouth daily. 90 tablet 2   meloxicam (MOBIC) 15 MG tablet Take 15 mg by mouth daily.     metoprolol succinate (TOPROL-XL) 25 MG 24 hr tablet Take 1 tablet (25 mg total) by mouth daily. 90 tablet 3   ondansetron (ZOFRAN-ODT) 4 MG disintegrating tablet Take 1 tablet (4 mg total) by mouth every 8 (eight) hours as needed for nausea or vomiting. 20 tablet 0   pantoprazole (PROTONIX) 40 MG tablet Take 1 tablet (40 mg total) by mouth daily. 30 tablet 2   sildenafil (REVATIO) 20 MG tablet Take 3-5 tablets (60-100 mg total) by mouth daily as needed. 150 tablet 5   No current facility-administered medications for this visit.    Allergies as of 01/16/2023 - Review Complete 01/16/2023  Allergen Reaction Noted   Crestor [rosuvastatin calcium] Other (See Comments) 11/22/2017   Naproxen sodium Swelling 09/03/2011   Penicillins Swelling    Repatha [evolocumab]  09/25/2021   Erythromycin Rash     Family History  Problem Relation Age of Onset   Cancer Mother        Renal CA, treated 1993   Parkinsonism Father    Alzheimer's disease Father     Diabetes Brother    Diabetes Other    Kidney disease Other    Colon cancer Neg Hx    Prostate cancer Neg Hx    Esophageal cancer Neg Hx    Rectal cancer Neg Hx    Stomach cancer Neg Hx    Pancreatic cancer Neg Hx    Colon polyps Neg Hx     Social History   Socioeconomic History   Marital status: Married    Spouse name: Not on file   Number of children: 3   Years of education: Not on file   Highest education level: Master's degree (e.g., MA, MS, MEng, MEd, MSW, MBA)  Occupational History   Occupation: IT trainer, 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    Comment:  [employer], [employer], [place], [place]  Tobacco Use   Smoking status: Former    Current packs/day: 0.00    Average packs/day: 1.5 packs/day for 34.0 years (51.0 ttl pk-yrs)    Types: Cigarettes    Start date: 10/30/1974    Quit date: 10/29/2008    Years since quitting: 14.2   Smokeless tobacco: Never  Vaping Use   Vaping status: Never Used  Substance and Sexual Activity   Alcohol use: Not Currently    Comment: Rare- couple times per year   Drug use: Never   Sexual activity: Yes  Other Topics Concern   Not on file  Social History Narrative   Married since 1988, second marriage   3 children   1 daughter alive -POTS - monitors salt intake.     2 step-daughters (one diagnosed with Crohn's Disease.)   Regular exercise:  Yes   Social Determinants of Health   Financial Resource Strain: Low Risk  (10/12/2022)   Overall Financial Resource Strain (CARDIA)    Difficulty of Paying Living Expenses: Not hard at all  Food Insecurity: No Food Insecurity (10/12/2022)   Hunger Vital Sign    Worried About Running Out of Food in the Last Year: Never true    Ran Out of Food in the Last Year: Never true  Transportation Needs: No Transportation Needs (10/12/2022)   PRAPARE - Administrator, Civil Service (Medical): No    Lack of Transportation (Non-Medical): No  Physical Activity: Insufficiently Active (10/12/2022)    Exercise Vital Sign    Days of Exercise per Week: 2 days    Minutes of Exercise per Session: 20 min  Stress: No Stress Concern Present (10/12/2022)   [employer] of Occupational Health - Occupational Stress Questionnaire    Feeling of Stress : Only a little  Social Connections: Socially Integrated (10/12/2022)   Social Connection and Isolation Panel [NHANES]    Frequency of Communication with Friends and Family: Three times a week    Frequency of Social Gatherings with Friends and Family: Never    Attends Religious Services: 1 to 4 times per year    Active Member of [employer] or Organizations: Yes    Attends Engineer, structural: More than 4 times per year    Marital Status: Married  Catering manager Violence: Not on file    Physical Exam: Vital signs in last 24 hours: @There  were no vitals taken for this visit. GEN: NAD EYE: Sclerae anicteric ENT: MMM CV: Non-tachycardic Pulm: CTA b/l GI: Soft, NT/ND NEURO:  Alert & Oriented x 3   Doristine Locks, DO [facility]   01/16/2023 2:26 PM

## 2023-01-16 NOTE — Progress Notes (Signed)
Called to room to assist during endoscopic procedure.  Patient ID and intended procedure confirmed with present staff. Received instructions for my participation in the procedure from the performing physician.  

## 2023-01-16 NOTE — Op Note (Signed)
[facility] Patient Name: Steven [name] Procedure Date: 01/16/2023 2:48 PM MRN: [id number] Endoscopist: Doristine Locks , MD, [id number] Age: 62 Referring MD:  Date of Birth: 1961/02/02 Gender: Male Account #: [id number] Procedure:                Upper GI endoscopy Indications:              Nausea, Dark stools                           62 yo male with recurrent nausea without emesis for                            a number of months, and more recently with dark                            stools. No abdominal pain.                           ER evaluation on 7/22 for dark stools and ongoing                            nausea was largely unremarkable to include normal                            CBC, CMP, lipase, lactic acid, troponin, and FOBT-.                            CT angio notable for 4.6 x 4.8 cm ascending                            thoracic aortic aneurysm and a stable 13 mm                            portacaval lymph node (unchanged from previous                            imaging and no other lymphadenopathy noted), but                            otherwise unremarkable and no acute intra-abdominal                            pathology noted. Medicines:                Monitored Anesthesia Care Procedure:                Pre-Anesthesia Assessment:                           - Prior to the procedure, a History and Physical                            was performed, and patient medications and  allergies were reviewed. The patient's tolerance of                            previous anesthesia was also reviewed. The risks                            and benefits of the procedure and the sedation                            options and risks were discussed with the patient.                            All questions were answered, and informed consent                            was obtained. Prior Anticoagulants: The patient has                             taken no anticoagulant or antiplatelet agents. ASA                            Grade Assessment: II - A patient with mild systemic                            disease. After reviewing the risks and benefits,                            the patient was deemed in satisfactory condition to                            undergo the procedure.                           After obtaining informed consent, the endoscope was                            passed under direct vision. Throughout the                            procedure, the patient's blood pressure, pulse, and                            oxygen saturations were monitored continuously. The                            GIF [id number] #[id number] was introduced through the                            mouth, and advanced to the second part of duodenum.                            The upper GI endoscopy was accomplished without  difficulty. The patient tolerated the procedure                            well. Scope In: Scope Out: Findings:                 The examined esophagus was normal.                           A single small erosion with no bleeding and no                            stigmata of recent bleeding was found in the                            prepyloric region of the stomach. Biopsies were                            taken with a cold forceps for histology (jar 1).                            Estimated blood loss was minimal. The pylorus was                            otherwise patent and easily traversed.                           The remainder of the stomach was otherwise normal.                            Additional mucosal biopsies were taken with a cold                            forceps for Helicobacter pylori testing (jar 2).                            Estimated blood loss was minimal.                           The examined duodenum was normal. Complications:            No immediate complications. Estimated Blood  Loss:     Estimated blood loss was minimal. Impression:               - Normal esophagus.                           - A single, small, non-bleeding gastric erosion                            with no bleeding and no stigmata of recent bleeding                            was noted in the pre-pyloric stomach. Biopsied.                           -  The remainder of the stomach was otherwise normal                            appearing on anterograde and retroflexed views. The                            pylorus was patent and easily traversed. The                            stomach was biopsied.                           - Normal examined duodenum. Recommendation:           - Patient has a contact number available for                            emergencies. The signs and symptoms of potential                            delayed complications were discussed with the                            patient. Return to normal activities tomorrow.                            Written discharge instructions were provided to the                            patient.                           - Resume previous diet.                           - Continue present medications.                           - Await pathology results.                           - Follow-up with me or Doug Sou, PA-C in the GI                            clinic at the next available appointment. Doristine Locks, MD 01/16/2023 3:15:21 PM

## 2023-01-17 ENCOUNTER — Telehealth: Payer: Self-pay | Admitting: *Deleted

## 2023-01-17 LAB — CULTURE, BLOOD (ROUTINE X 2): Culture: NO GROWTH

## 2023-01-17 NOTE — Telephone Encounter (Signed)
Attempted to call patient for their post-procedure follow-up call. No answer. Left voicemail.   

## 2023-01-22 ENCOUNTER — Ambulatory Visit (INDEPENDENT_AMBULATORY_CARE_PROVIDER_SITE_OTHER): Payer: BC Managed Care – PPO | Admitting: Thoracic Surgery (Cardiothoracic Vascular Surgery)

## 2023-01-22 ENCOUNTER — Encounter: Payer: Self-pay | Admitting: Thoracic Surgery (Cardiothoracic Vascular Surgery)

## 2023-01-22 ENCOUNTER — Other Ambulatory Visit: Payer: BC Managed Care – PPO

## 2023-01-22 VITALS — BP 135/71 | HR 86 | Resp 18 | Ht 74.0 in | Wt 241.0 lb

## 2023-01-22 DIAGNOSIS — I7121 Aneurysm of the ascending aorta, without rupture: Secondary | ICD-10-CM | POA: Diagnosis not present

## 2023-01-22 NOTE — Progress Notes (Signed)
[address]       [place] [address]             [phone]     HPI: Mr. Engeman returns for follow-up of his ascending aneurysm.  Steven [name] is a 62 year old man with a history of tobacco abuse, thoracic aortic atherosclerosis, ascending thoracic aneurysm, heart murmur, mild aortic stenosis status, mild to moderate aortic insufficiency, hyperlipidemia, nephrolithiasis, and a positive PPD.  I have been following him for an aneurysm dating back to 2018 when it was first noted on a CT for lung cancer screening.  Last saw him in January 2024.  The aneurysm measured 4.7 cm.  In the interim since his last visit he has been having a lot of issues with nausea.  That came to ahead about a week ago when he had a severe episode.  Also had a severe headache.  Came to the emergency room and had a syncopal spell.  CT of the chest, abdomen and pelvis was performed as well as a head CT.  Head CT was negative.  CT showed a stable ascending aneurysm.  He underwent an EGD which was negative as well.  He thinks the nausea may be due to his Nexlizet.  He has stopped taking all medications except for aspirin, metoprolol, and Protonix.  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 Wanda Plump with URO for benign noduarity in left hemiscrotum (2008) felt to be variant of normal.   Left nephrolithiasis    Testicular pain 12/01/2010   Per  Dr. Patsi Sears, thought to be due to epididymal cysts    Thoracic aortic aneurysm (HCC) 04/18/2017   VARICOCELE 02/17/2010   Qualifier: Diagnosis of  By: Etheleen Mayhew CMA (AAMA), [name]      Current Outpatient Medications  Medication Sig Dispense Refill   acetaminophen (TYLENOL) 500 MG tablet Take 500 mg by mouth every 6 (six) hours as needed.     aspirin 81 MG tablet Take 81 mg by mouth daily.     metoprolol succinate (TOPROL-XL) 25 MG 24 hr tablet Take 1 tablet (25 mg total) by mouth daily. 90 tablet 3   ondansetron (ZOFRAN-ODT) 4 MG disintegrating tablet Take 1 tablet (4 mg total) by mouth every 8 (eight) hours as needed for nausea or vomiting. 20 tablet 0   pantoprazole (PROTONIX) 40 MG tablet Take 1 tablet (40 mg total) by mouth 2 (two) times daily. 90 tablet 1   sildenafil (REVATIO) 20 MG tablet Take 3-5 tablets (60-100 mg total) by mouth daily as needed. 150 tablet 5   atorvastatin (LIPITOR) 20 MG tablet Take 0.5 tablets (10 mg total) by mouth daily. (Patient not taking: Reported on 01/22/2023)     Bempedoic Acid-Ezetimibe (NEXLIZET) 180-10 MG TABS Take 1 tablet  by mouth daily. (Patient not taking: Reported on 01/22/2023) 90 tablet 2   No current facility-administered medications for this visit.    Physical Exam BP 135/71 (BP Location: Right Arm, Patient Position: Sitting)   Pulse 86   Resp 18   Ht 6\' 2"  (1.88 m)   Wt 241 lb (109.3 kg)   SpO2 96% Comment: RA  BMI 30.94 kg/m  Well-appearing 62 year old man in no acute distress Alert and oriented x 3 with no focal deficits Lungs clear with good breath sounds bilaterally Cardiac regular rate and rhythm with a 2/6 systolic murmur and transmitted murmurs into the carotids No peripheral edema  Diagnostic Tests: CT ANGIOGRAPHY CHEST, ABDOMEN AND PELVIS   TECHNIQUE: Non-contrast CT of the chest was initially obtained.   Multidetector CT imaging through the chest, abdomen and pelvis was performed using the standard protocol during  bolus administration of intravenous contrast. Multiplanar reconstructed images and MIPs were obtained and reviewed to evaluate the vascular anatomy.   RADIATION DOSE REDUCTION: This exam was performed according to the departmental dose-optimization program which includes automated exposure control, adjustment of the mA and/or kV according to patient size and/or use of iterative reconstruction technique.   CONTRAST:  OMNIPAQUE IOHEXOL 350 MG/ML SOLN   COMPARISON:  CT angiogram chest 07/12/2022   FINDINGS: CTA CHEST FINDINGS   Cardiovascular: The ascending aorta is dilated measuring 4.6 x 4.8 cm, similar to the prior study. There is no evidence for aortic dissection. Origin of the great vessels is within normal limits. There is mild calcified atherosclerotic disease throughout the aorta. There is no central pulmonary embolism. Heart is normal in size. There is no pericardial effusion.   Mediastinum/Nodes: No enlarged mediastinal, hilar, or axillary lymph nodes. Thyroid gland, trachea, and esophagus demonstrate no significant findings.   Lungs/Pleura: Mild emphysematous changes are present in the lung apices. The lungs are otherwise clear. There is no pleural effusion or pneumothorax.   Musculoskeletal: Degenerative changes affect the spine   Review of the MIP images confirms the above findings.   CTA ABDOMEN AND PELVIS FINDINGS   VASCULAR   Aorta: Normal caliber aorta without aneurysm, dissection, vasculitis or significant stenosis. There is mild calcified atherosclerotic disease.   Celiac: Patent without evidence of aneurysm, dissection, vasculitis or significant stenosis.   SMA: Patent without evidence of aneurysm, dissection, vasculitis or significant stenosis.   Renals: Both renal arteries are patent without evidence of aneurysm, dissection, vasculitis, fibromuscular dysplasia or significant stenosis.   IMA: Patent without evidence of aneurysm, dissection,  vasculitis or significant stenosis.   Inflow: Patent without evidence of aneurysm, dissection, vasculitis or significant stenosis. There is mild calcified atherosclerotic disease.   Veins: No obvious venous abnormality within the limitations of this arterial phase study.   Review of the MIP images confirms the above findings.   NON-VASCULAR   Hepatobiliary: The liver is enlarged. No focal liver abnormality is seen. No gallstones, gallbladder wall thickening, or biliary dilatation.   Pancreas: Unremarkable. No pancreatic ductal dilatation or surrounding inflammatory changes.   Spleen: Normal in size without focal abnormality.   Adrenals/Urinary Tract: There is a 2 mm calculus in the superior pole the left kidney. Otherwise, the kidneys, adrenal glands and bladder are within normal limits.   Stomach/Bowel: Stomach is within normal limits. Appendix appears normal. No evidence of bowel wall thickening, distention, or inflammatory changes.   Lymphatic: There is an enlarged portacaval lymph node measuring 13 mm short axis which is unchanged. No other enlarged lymph nodes  are seen.   Reproductive: Prostate gland is mildly prominent in size.   Other: No abdominal wall hernia or abnormality. No abdominopelvic ascites.   Musculoskeletal: Degenerative changes affect the spine.   Review of the MIP images confirms the above findings.   IMPRESSION: 1. No evidence for aortic dissection or aneurysm. 2. Stable aneurysmal dilatation of the ascending thoracic aorta measuring 4.8 cm. Recommend semi-annual imaging followup by CTA or MRA and referral to cardiothoracic surgery if not already obtained. This recommendation follows 2010 ACCF/AHA/AATS/ACR/ASA/SCA/SCAI/SIR/STS/SVM Guidelines for the Diagnosis and Management of Patients With Thoracic Aortic Disease. Circulation. 2010; 121: R604-V409. Aortic aneurysm NOS (ICD10-I71.9) 3. Stable enlarged portacaval lymph node,  indeterminate. 4. Nonobstructing left renal calculus. 5. Hepatomegaly.   Aortic Atherosclerosis (ICD10-I70.0).     Electronically Signed   By: Darliss Cheney M.D.   On: 01/14/2023 17:41 I personally reviewed the CT images.  Stable 4.7 to 4.8 cm ascending aneurysm.  Impression: Steven [name]" Steven [name] is a 62 year old man with a history of tobacco abuse, thoracic aortic atherosclerosis, ascending thoracic aneurysm, heart murmur, mild aortic stenosis status, mild to moderate aortic insufficiency, hyperlipidemia, nephrolithiasis, and a positive PPD.  Ascending aneurysm-stable at about 4.7 to 4.8 cm.  Needs continued semiannual follow-up.  Hypertension-blood pressure well-controlled on metoprolol.  Persistent nausea-being worked up by GI.  Plan: Return in 6 months with CT angio of chest  Loreli Slot, MD [facility] [phone]

## 2023-01-23 ENCOUNTER — Ambulatory Visit: Payer: BC Managed Care – PPO | Admitting: Physician Assistant

## 2023-01-24 NOTE — Therapy (Signed)
OUTPATIENT PHYSICAL THERAPY SHOULDER EVALUATION   Patient Name: Steven [name] MRN: [id number] DOB:10-Aug-1960, 62 y.o., male Today's Date: 01/25/2023  END OF SESSION:  PT End of Session - 01/25/23 0834     Visit Number 1    Date for PT Re-Evaluation 04/05/23    Authorization Type [employer]    PT Start Time [phone]    PT Stop Time 0920    PT Time Calculation (min) 45 min    Activity Tolerance Patient tolerated treatment well    Behavior During Therapy WFL for tasks assessed/performed             Past Medical History:  Diagnosis Date   AAA (abdominal aortic aneurysm) (HCC)    Allergy    At risk for sleep apnea    STOP-BANG= 4     SENT TO PCP 04-15-2014   BRBPR (bright red blood per rectum) 07/14/2015   ED (erectile dysfunction) 03/10/2013   Epididymal cyst    LEFT   GERD (gastroesophageal reflux disease)    Heart murmur mild mvp  -- asymptomatic   per pt echo 2005   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 Wanda Plump with URO for benign noduarity in left hemiscrotum (2008) felt to be variant of normal.   Left nephrolithiasis    Testicular pain 12/01/2010   Per Dr. Patsi Sears, thought to be due to epididymal cysts    Thoracic aortic aneurysm (HCC) 04/18/2017   VARICOCELE 02/17/2010   Qualifier: Diagnosis of  By: Etheleen Mayhew CMA Duncan Dull), [name]     Past Surgical History:  Procedure Laterality Date   COLONOSCOPY     COLONOSCOPY WITH PROPOFOL  01-06-2014   POLYPECTOMY   CYSTOSCOPY WITH RETROGRADE PYELOGRAM, URETEROSCOPY AND STENT PLACEMENT Left 04/19/2014   Procedure: CYSTOSCOPY WITH RETROGRADE PYELOGRAM with interpretation, URETEROSCOPY, stone extraction with basket, AND STENT PLACEMENT, implantation of backstop;  Surgeon: Kathi Ludwig, MD;  Location: [facility];  Service: Urology;   Laterality: Left;   EXCISION SEBACEOUS CYST X3 OF BACK  03-13-2010   HOLMIUM LASER APPLICATION Left 04/19/2014   Procedure: HOLMIUM LASER APPLICATION;  Surgeon: Kathi Ludwig, MD;  Location: [facility];  Service: Urology;  Laterality: Left;   NASAL SEPTUM SURGERY  09-21-2002   POLYPECTOMY     REFRACTIVE SURGERY  1994   Patient Active Problem List   Diagnosis Date Noted   Nausea 10/17/2022   Shoulder pain 10/17/2022   Tinnitus 10/17/2022   Onychomycosis 09/27/2021   Voice hoarseness 03/15/2021   Aortic stenosis, mild 06/07/2020   Thoracic aortic aneurysm (HCC) 04/18/2017   Snoring 04/02/2017   Hyperglycemia 03/10/2013   ED (erectile dysfunction) 03/10/2013   Seborrheic keratoses 02/29/2012   INTERMITTENT VERTIGO 05/08/2010   VARICOCELE 02/17/2010   Hypogonadism in male 02/13/2010   HLD (hyperlipidemia) 02/13/2010   PLANTAR FASCIITIS 02/13/2010    PCP: Crawford Givens  REFERRING PROVIDER: Freddrick March   REFERRING DIAG:  M75.41 (ICD-10-CM) - Impingement syndrome of right shoulder    THERAPY DIAG:  Acute pain of right shoulder  Stiffness of right shoulder, not elsewhere classified  Abnormal posture  Muscle weakness (generalized)  Rationale for Evaluation and Treatment: Rehabilitation  ONSET DATE: 01/10/23  SUBJECTIVE:  SUBJECTIVE STATEMENT: I have some kind of issue in my right shoulder. Orthopedic has told me all sorts of things bursitis and arthritis. I think it is coming from the medication (Nexlizet), I quit taking it a week ago.   Hand dominance: Right  PERTINENT HISTORY: N/A  PAIN:  Are you having pain? Yes: NPRS scale: 5/10 Pain location: posterior R shoulder Pain description: only hurts if I move it backwards Aggravating factors: reaching  behind Relieving factors: nothing really   PRECAUTIONS: None  RED FLAGS: None   WEIGHT BEARING RESTRICTIONS: No  FALLS:  Has patient fallen in last 6 months? No  LIVING ENVIRONMENT: Lives with: lives with their family and lives with their spouse Lives in: House/apartment  OCCUPATION: [employer] pilot, Passenger transport manager mostly   PLOF: Independent  PATIENT GOALS: no pain   NEXT MD VISIT:   OBJECTIVE:   DIAGNOSTIC FINDINGS:  FINDINGS: AC joint degenerative change. No fracture or dislocation. No other abnormalities.   IMPRESSION: AC joint degenerative changes.  PATIENT SURVEYS:  FOTO 57  COGNITION: Overall cognitive status: Within functional limits for tasks assessed     SENSATION: WFL  POSTURE: Rounded shoulders  UPPER EXTREMITY ROM: able to get full range but painful at end ranges with flexion and IR and pain halfway through motion for abduction and ER    UPPER EXTREMITY MMT: 5/5 except R shoulder abd 3+ with pain    SHOULDER SPECIAL TESTS: Impingement tests: Neer impingement test: negative, Hawkins/Kennedy impingement test: positive , and Painful arc test: positive   JOINT MOBILITY TESTING:  Full ROM but muscle tightness and pain  PALPATION:  Very tight in R upper trap   TODAY'S TREATMENT:                                                                                                                                         DATE: 01/25/23- EVAL   PATIENT EDUCATION: Education details: POC and HEP Person educated: Patient Education method: Explanation Education comprehension: verbalized understanding  HOME EXERCISE PROGRAM: Access Code: [email or web address] URL: [email or web address] Date: 01/25/2023 Prepared by: Cassie Freer  Exercises - Standing Shoulder Row with Anchored Resistance  - 1 x daily - 7 x weekly - 2 sets - 10 reps - Shoulder extension with resistance - Neutral  - 1 x daily - 7 x weekly - 2 sets - 10 reps - Shoulder External  Rotation and Scapular Retraction with Resistance  - 1 x daily - 7 x weekly - 2 sets - 10 reps - Standing Shoulder Horizontal Abduction with Resistance  - 1 x daily - 7 x weekly - 2 sets - 10 reps - Doorway Pec Stretch at 60 Degrees Abduction with Arm Straight  - 1 x daily - 7 x weekly - 30 hold - Doorway Rhomboid Stretch  - 1 x daily - 7 x weekly - 30 hold  ASSESSMENT:  CLINICAL IMPRESSION: Patient is  a 62 y.o. male who was seen today for physical therapy evaluation and treatment for R shoulder pain. He reports his pain is mostly on the top of his shoulder and radiates down into his deltoids. He states he only has pain if he moves it in certain directions-- mostly reaching backwards and sometimes overhead. Patient presents with good overall strength, and is able to get into full ranges but with pain. Most pain today with resisted abduction and with motions breaking parallel lane horizontally. He has possible impingement syndrome as seen with positive special testing today. He also is very tight in his upper traps, more in the R than the L. He will benefit from skilled PT to address his pain and work on posterior strengthening and opening up subacromial space as much as possible  OBJECTIVE IMPAIRMENTS: decreased strength, impaired UE functional use, and pain.   ACTIVITY LIMITATIONS: reach over head and reaching arm back  REHAB POTENTIAL: Good  CLINICAL DECISION MAKING: Stable/uncomplicated  EVALUATION COMPLEXITY: Low  GOALS: Goals reviewed with patient? Yes  SHORT TERM GOALS: Target date: 03/01/23  Patient will be independent with initial HEP.  Goal status: INITIAL   LONG TERM GOALS: Target date: 04/05/23  Patient will be independent with advanced/ongoing HEP to improve outcomes and carryover.  Goal status: INITIAL  2.  Patient will report 75% improvement in R shoulder pain to improve QOL.  Baseline: 5/10 when it hurts Goal status: INITIAL  3.  Patient will be able to reach  overhead and behind his back without pain  Baseline: unable to reach past horizontal plane  Goal status: INITIAL  4.  Patient will demonstrate full AROM of R shoulder without pain provocation to allow for increased ease of ADLs.  Baseline: full ROM but painful Goal status: INITIAL  5.  Patient will report 48 on FOTO (patient outcome measure)  to demonstrate improved functional ability.  Baseline: 57 Goal status: INITIAL  PLAN:  PT FREQUENCY: 1-2x/week  PT DURATION: 10 weeks  PLANNED INTERVENTIONS: Therapeutic exercises, Therapeutic activity, Neuromuscular re-education, Balance training, Gait training, Patient/Family education, Self Care, Joint mobilization, Dry Needling, Electrical stimulation, Spinal manipulation, Spinal mobilization, Cryotherapy, Moist heat, Vasopneumatic device, Ionotophoresis 4mg /ml Dexamethasone, and Manual therapy  PLAN FOR NEXT SESSION: R shoulder mobility and strengthening, STM    [employer], PT 01/25/2023, 9:19 AM

## 2023-01-25 ENCOUNTER — Ambulatory Visit: Payer: BC Managed Care – PPO | Attending: Family Medicine

## 2023-01-25 DIAGNOSIS — M25511 Pain in right shoulder: Secondary | ICD-10-CM | POA: Diagnosis not present

## 2023-01-25 DIAGNOSIS — R293 Abnormal posture: Secondary | ICD-10-CM | POA: Insufficient documentation

## 2023-01-25 DIAGNOSIS — M25611 Stiffness of right shoulder, not elsewhere classified: Secondary | ICD-10-CM | POA: Insufficient documentation

## 2023-01-25 DIAGNOSIS — M6281 Muscle weakness (generalized): Secondary | ICD-10-CM | POA: Diagnosis not present

## 2023-01-29 ENCOUNTER — Encounter: Payer: Self-pay | Admitting: Physical Therapy

## 2023-01-29 ENCOUNTER — Ambulatory Visit: Payer: BC Managed Care – PPO | Admitting: Physical Therapy

## 2023-01-29 DIAGNOSIS — M6281 Muscle weakness (generalized): Secondary | ICD-10-CM

## 2023-01-29 DIAGNOSIS — M25511 Pain in right shoulder: Secondary | ICD-10-CM

## 2023-01-29 DIAGNOSIS — R293 Abnormal posture: Secondary | ICD-10-CM

## 2023-01-29 DIAGNOSIS — M25611 Stiffness of right shoulder, not elsewhere classified: Secondary | ICD-10-CM | POA: Diagnosis not present

## 2023-01-29 NOTE — Therapy (Signed)
OUTPATIENT PHYSICAL THERAPY SHOULDER EVALUATION   Patient Name: Steven [name] MRN: [id number] DOB:1961/03/10, 62 y.o., male Today's Date: 01/29/2023  END OF SESSION:  PT End of Session - 01/29/23 0845     Visit Number 2    Date for PT Re-Evaluation 04/05/23    PT Start Time 0845    PT Stop Time 0930    PT Time Calculation (min) 45 min    Activity Tolerance Patient tolerated treatment well    Behavior During Therapy [facility] for tasks assessed/performed             Past Medical History:  Diagnosis Date   AAA (abdominal aortic aneurysm) (HCC)    Allergy    At risk for sleep apnea    STOP-BANG= 4     SENT TO PCP 04-15-2014   BRBPR (bright red blood per rectum) 07/14/2015   ED (erectile dysfunction) 03/10/2013   Epididymal cyst    LEFT   GERD (gastroesophageal reflux disease)    Heart murmur mild mvp  -- asymptomatic   per pt echo 2005   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 Wanda Plump with URO for benign noduarity in left hemiscrotum (2008) felt to be variant of normal.   Left nephrolithiasis    Testicular pain 12/01/2010   Per Dr. Patsi Sears, thought to be due to epididymal cysts    Thoracic aortic aneurysm (HCC) 04/18/2017   VARICOCELE 02/17/2010   Qualifier: Diagnosis of  By: Etheleen Mayhew CMA Duncan Dull), [name]     Past Surgical History:  Procedure Laterality Date   COLONOSCOPY     COLONOSCOPY WITH PROPOFOL  01-06-2014   POLYPECTOMY   CYSTOSCOPY WITH RETROGRADE PYELOGRAM, URETEROSCOPY AND STENT PLACEMENT Left 04/19/2014   Procedure: CYSTOSCOPY WITH RETROGRADE PYELOGRAM with interpretation, URETEROSCOPY, stone extraction with basket, AND STENT PLACEMENT, implantation of backstop;  Surgeon: Kathi Ludwig, MD;  Location: [facility];  Service: Urology;  Laterality: Left;   EXCISION  SEBACEOUS CYST X3 OF BACK  03-13-2010   HOLMIUM LASER APPLICATION Left 04/19/2014   Procedure: HOLMIUM LASER APPLICATION;  Surgeon: Kathi Ludwig, MD;  Location: [facility];  Service: Urology;  Laterality: Left;   NASAL SEPTUM SURGERY  09-21-2002   POLYPECTOMY     REFRACTIVE SURGERY  1994   Patient Active Problem List   Diagnosis Date Noted   Nausea 10/17/2022   Shoulder pain 10/17/2022   Tinnitus 10/17/2022   Onychomycosis 09/27/2021   Voice hoarseness 03/15/2021   Aortic stenosis, mild 06/07/2020   Thoracic aortic aneurysm (HCC) 04/18/2017   Snoring 04/02/2017   Hyperglycemia 03/10/2013   ED (erectile dysfunction) 03/10/2013   Seborrheic keratoses 02/29/2012   INTERMITTENT VERTIGO 05/08/2010   VARICOCELE 02/17/2010   Hypogonadism in male 02/13/2010   HLD (hyperlipidemia) 02/13/2010   PLANTAR FASCIITIS 02/13/2010    PCP: Crawford Givens  REFERRING PROVIDER: Freddrick March   REFERRING DIAG:  M75.41 (ICD-10-CM) - Impingement syndrome of right shoulder    THERAPY DIAG:  Acute pain of right shoulder  Stiffness of right shoulder, not elsewhere classified  Abnormal posture  Muscle weakness (generalized)  Rationale for Evaluation and Treatment: Rehabilitation  ONSET DATE: 01/10/23  SUBJECTIVE:  SUBJECTIVE STATEMENT: "A little better" Less pain and more motion  Hand dominance: Right  PERTINENT HISTORY: N/A  PAIN:  Are you having pain? Yes: NPRS scale: 0/10 Pain location: posterior R shoulder Pain description: only hurts if I move it backwards Aggravating factors: reaching behind Relieving factors: nothing really   PRECAUTIONS: None  RED FLAGS: None   WEIGHT BEARING RESTRICTIONS: No  FALLS:  Has patient fallen in last 6 months? No  LIVING  ENVIRONMENT: Lives with: lives with their family and lives with their spouse Lives in: House/apartment  OCCUPATION: [employer] pilot, Passenger transport manager mostly   PLOF: Independent  PATIENT GOALS: no pain   NEXT MD VISIT:   OBJECTIVE:   DIAGNOSTIC FINDINGS:  FINDINGS: AC joint degenerative change. No fracture or dislocation. No other abnormalities.   IMPRESSION: AC joint degenerative changes.  PATIENT SURVEYS:  FOTO 57  COGNITION: Overall cognitive status: Within functional limits for tasks assessed     SENSATION: WFL  POSTURE: Rounded shoulders  UPPER EXTREMITY ROM: able to get full range but painful at end ranges with flexion and IR and pain halfway through motion for abduction and ER    UPPER EXTREMITY MMT: 5/5 except R shoulder abd 3+ with pain    SHOULDER SPECIAL TESTS: Impingement tests: Neer impingement test: negative, Hawkins/Kennedy impingement test: positive , and Painful arc test: positive   JOINT MOBILITY TESTING:  Full ROM but muscle tightness and pain  PALPATION:  Very tight in R upper trap   TODAY'S TREATMENT:                                                                                                                                         DATE:  01/29/23 UBE L2 x3 min  AAROM 3lb WaTE flex, Ext, IR  Rows & Ext blue 2x10 RUE ER/IR green 2x10 Triceps Ext 35lb 3x15 RUE PROM in all directions with end range holds  Rue GH jt mobs grade 3  01/25/23- EVAL   PATIENT EDUCATION: Education details: POC and HEP Person educated: Patient Education method: Explanation Education comprehension: verbalized understanding  HOME EXERCISE PROGRAM: Access Code: [email or web address] URL: [email or web address] Date: 01/25/2023 Prepared by: Cassie Freer  Exercises - Standing Shoulder Row with Anchored Resistance  - 1 x daily - 7 x weekly - 2 sets - 10 reps - Shoulder extension with resistance - Neutral  - 1 x daily - 7 x weekly - 2 sets - 10 reps -  Shoulder External Rotation and Scapular Retraction with Resistance  - 1 x daily - 7 x weekly - 2 sets - 10 reps - Standing Shoulder Horizontal Abduction with Resistance  - 1 x daily - 7 x weekly - 2 sets - 10 reps - Doorway Pec Stretch at 60 Degrees Abduction with Arm Straight  - 1 x daily - 7 x weekly - 30 hold - Doorway Rhomboid Stretch  - 1 x daily -  7 x weekly - 30 hold  ASSESSMENT:  CLINICAL IMPRESSION: Patient is a 62 y.o. male who was seen today for physical therapy treatment for R shoulder pain. He remains very tight in his upper traps, more in the R than the L. Session consisted of R should and scapular stabilization. Postural cues needed with rows and extensions. Tactile cus needed to keep UE in position with ER/Ir. End rage pain with PROM that did lessen as PROM had progressed. Passive RUE ER was the most tight and caused the most pain. He will benefit from skilled PT to address his pain and work on posterior strengthening and opening up subacromial space as much as possible  OBJECTIVE IMPAIRMENTS: decreased strength, impaired UE functional use, and pain.   ACTIVITY LIMITATIONS: reach over head and reaching arm back  REHAB POTENTIAL: Good  CLINICAL DECISION MAKING: Stable/uncomplicated  EVALUATION COMPLEXITY: Low  GOALS: Goals reviewed with patient? Yes  SHORT TERM GOALS: Target date: 03/01/23  Patient will be independent with initial HEP.  Goal status: INITIAL   LONG TERM GOALS: Target date: 04/05/23  Patient will be independent with advanced/ongoing HEP to improve outcomes and carryover.  Goal status: INITIAL  2.  Patient will report 75% improvement in R shoulder pain to improve QOL.  Baseline: 5/10 when it hurts Goal status: INITIAL  3.  Patient will be able to reach overhead and behind his back without pain  Baseline: unable to reach past horizontal plane  Goal status: INITIAL  4.  Patient will demonstrate full AROM of R shoulder without pain provocation to  allow for increased ease of ADLs.  Baseline: full ROM but painful Goal status: INITIAL  5.  Patient will report 39 on FOTO (patient outcome measure)  to demonstrate improved functional ability.  Baseline: 57 Goal status: INITIAL  PLAN:  PT FREQUENCY: 1-2x/week  PT DURATION: 10 weeks  PLANNED INTERVENTIONS: Therapeutic exercises, Therapeutic activity, Neuromuscular re-education, Balance training, Gait training, Patient/Family education, Self Care, Joint mobilization, Dry Needling, Electrical stimulation, Spinal manipulation, Spinal mobilization, Cryotherapy, Moist heat, Vasopneumatic device, Ionotophoresis 4mg /ml Dexamethasone, and Manual therapy  PLAN FOR NEXT SESSION: R shoulder mobility and strengthening, STM    Grayce Sessions, PTA 01/29/2023, 8:45 AM

## 2023-02-01 ENCOUNTER — Telehealth: Payer: Self-pay | Admitting: Gastroenterology

## 2023-02-01 ENCOUNTER — Ambulatory Visit: Payer: BC Managed Care – PPO | Admitting: Physical Therapy

## 2023-02-01 DIAGNOSIS — M6281 Muscle weakness (generalized): Secondary | ICD-10-CM | POA: Diagnosis not present

## 2023-02-01 DIAGNOSIS — M25611 Stiffness of right shoulder, not elsewhere classified: Secondary | ICD-10-CM

## 2023-02-01 DIAGNOSIS — R293 Abnormal posture: Secondary | ICD-10-CM | POA: Diagnosis not present

## 2023-02-01 DIAGNOSIS — M25511 Pain in right shoulder: Secondary | ICD-10-CM | POA: Diagnosis not present

## 2023-02-01 NOTE — Therapy (Signed)
OUTPATIENT PHYSICAL THERAPY SHOULDER    Patient Name: Steven [name] MRN: [id number] DOB:December 02, 1960, 62 y.o., male Today's Date: 02/01/2023  END OF SESSION:  PT End of Session - 02/01/23 1008     Visit Number 3    Date for PT Re-Evaluation 04/05/23    Authorization Type [employer]    PT Start Time 1008    PT Stop Time 1050    PT Time Calculation (min) 42 min             Past Medical History:  Diagnosis Date   AAA (abdominal aortic aneurysm) (HCC)    Allergy    At risk for sleep apnea    STOP-BANG= 4     SENT TO PCP 04-15-2014   BRBPR (bright red blood per rectum) 07/14/2015   ED (erectile dysfunction) 03/10/2013   Epididymal cyst    LEFT   GERD (gastroesophageal reflux disease)    Heart murmur mild mvp  -- asymptomatic   per pt echo 2005   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 Wanda Plump with URO for benign noduarity in left hemiscrotum (2008) felt to be variant of normal.   Left nephrolithiasis    Testicular pain 12/01/2010   Per Dr. Patsi Sears, thought to be due to epididymal cysts    Thoracic aortic aneurysm (HCC) 04/18/2017   VARICOCELE 02/17/2010   Qualifier: Diagnosis of  By: Etheleen Mayhew CMA Duncan Dull), [name]     Past Surgical History:  Procedure Laterality Date   COLONOSCOPY     COLONOSCOPY WITH PROPOFOL  01-06-2014   POLYPECTOMY   CYSTOSCOPY WITH RETROGRADE PYELOGRAM, URETEROSCOPY AND STENT PLACEMENT Left 04/19/2014   Procedure: CYSTOSCOPY WITH RETROGRADE PYELOGRAM with interpretation, URETEROSCOPY, stone extraction with basket, AND STENT PLACEMENT, implantation of backstop;  Surgeon: Kathi Ludwig, MD;  Location: [facility];  Service: Urology;  Laterality: Left;   EXCISION SEBACEOUS CYST X3 OF BACK  03-13-2010   HOLMIUM LASER APPLICATION Left 04/19/2014   Procedure: HOLMIUM  LASER APPLICATION;  Surgeon: Kathi Ludwig, MD;  Location: [facility];  Service: Urology;  Laterality: Left;   NASAL SEPTUM SURGERY  09-21-2002   POLYPECTOMY     REFRACTIVE SURGERY  1994   Patient Active Problem List   Diagnosis Date Noted   Nausea 10/17/2022   Shoulder pain 10/17/2022   Tinnitus 10/17/2022   Onychomycosis 09/27/2021   Voice hoarseness 03/15/2021   Aortic stenosis, mild 06/07/2020   Thoracic aortic aneurysm (HCC) 04/18/2017   Snoring 04/02/2017   Hyperglycemia 03/10/2013   ED (erectile dysfunction) 03/10/2013   Seborrheic keratoses 02/29/2012   INTERMITTENT VERTIGO 05/08/2010   VARICOCELE 02/17/2010   Hypogonadism in male 02/13/2010   HLD (hyperlipidemia) 02/13/2010   PLANTAR FASCIITIS 02/13/2010    PCP: Crawford Givens  REFERRING PROVIDER: Freddrick March   REFERRING DIAG:  M75.41 (ICD-10-CM) - Impingement syndrome of right shoulder    THERAPY DIAG:  Acute pain of right shoulder  Stiffness of right shoulder, not elsewhere classified  Abnormal posture  Muscle weakness (generalized)  Rationale for Evaluation and Treatment: Rehabilitation  ONSET DATE: 01/10/23  SUBJECTIVE:  SUBJECTIVE STATEMENT: "A little more sore"  Hand dominance: Right  PERTINENT HISTORY: N/A  PAIN:  Are you having pain? Yes: NPRS scale: 3-4/10 Pain location: posterior R shoulder Pain description: only hurts if I move it backwards Aggravating factors: reaching behind Relieving factors: nothing really   PRECAUTIONS: None  RED FLAGS: None   WEIGHT BEARING RESTRICTIONS: No  FALLS:  Has patient fallen in last 6 months? No  LIVING ENVIRONMENT: Lives with: lives with their family and lives with their spouse Lives in: House/apartment  OCCUPATION: [employer] pilot,  Passenger transport manager mostly   PLOF: Independent  PATIENT GOALS: no pain   NEXT MD VISIT:   OBJECTIVE:   DIAGNOSTIC FINDINGS:  FINDINGS: AC joint degenerative change. No fracture or dislocation. No other abnormalities.   IMPRESSION: AC joint degenerative changes.  PATIENT SURVEYS:  FOTO 57  COGNITION: Overall cognitive status: Within functional limits for tasks assessed     SENSATION: WFL  POSTURE: Rounded shoulders  UPPER EXTREMITY ROM: able to get full range but painful at end ranges with flexion and IR and pain halfway through motion for abduction and ER    UPPER EXTREMITY MMT: 5/5 except R shoulder abd 3+ with pain    SHOULDER SPECIAL TESTS: Impingement tests: Neer impingement test: negative, Hawkins/Kennedy impingement test: positive , and Painful arc test: positive   JOINT MOBILITY TESTING:  Full ROM but muscle tightness and pain  PALPATION:  Very tight in R upper trap   TODAY'S TREATMENT:                                                                                                                                         DATE:   02/01/23 UBE L 2 2 min fwd/2 min backward Cable pulleys 15# rows 2 sets 10, shld ext 10# 2 sets 10 Upright row 20# 2 sets 10 PNF RT UE 3# 2 sets 10 Empty can RT UE 3# 2 sets 10 RT UE ER at 90/90 5# 2 sets 10 - some pain Shld flex into abd and lower then reverse 10 each 3# STW with TP release to post shld/trap/rhomboid  Theragun to above R UE GH jt mobs grade 3 Educ on desk ergonomics and supporting RT UE when using keyboard and mouse to unload and support shld Ionto RT post shld 1.2 cc dex 4 mA 4 hour patch- educ on use ,safety and possible SE of ionto    01/29/23 UBE L2 x3 min  AAROM 3lb WaTE flex, Ext, IR  Rows & Ext blue 2x10 RUE ER/IR green 2x10 Triceps Ext 35lb 3x15 RUE PROM in all directions with end range holds  Rue GH jt mobs grade 3  01/25/23- EVAL   PATIENT EDUCATION: Education details: POC and HEP Person  educated: Patient Education method: Explanation Education comprehension: verbalized understanding  HOME EXERCISE PROGRAM: Access Code: [email or web address] URL: [email or web address] Date: 01/25/2023 Prepared by: Steven [name]  Exercises - Standing Shoulder Row with Anchored Resistance  - 1 x daily - 7 x weekly - 2 sets - 10 reps - Shoulder extension with resistance - Neutral  - 1 x daily - 7 x weekly - 2 sets - 10 reps - Shoulder External Rotation and Scapular Retraction with Resistance  - 1 x daily - 7 x weekly - 2 sets - 10 reps - Standing Shoulder Horizontal Abduction with Resistance  - 1 x daily - 7 x weekly - 2 sets - 10 reps - Doorway Pec Stretch at 60 Degrees Abduction with Arm Straight  - 1 x daily - 7 x weekly - 30 hold - Doorway Rhomboid Stretch  - 1 x daily - 7 x weekly - 30 hold  ASSESSMENT:  CLINICAL IMPRESSION: Pt arrived today alittle more sore. Pain isolated in post shld-fwd/rounded shld. Tightness and TP in post shld/trap/rhom. Educ on desk ergonomics and supporting RT UE when using keyboard and mouse to unload and support shld. Shld strengthening with cuing- verb and tactile needed with mild increase in pain. STG met OBJECTIVE IMPAIRMENTS: decreased strength, impaired UE functional use, and pain.   ACTIVITY LIMITATIONS: reach over head and reaching arm back  REHAB POTENTIAL: Good  CLINICAL DECISION MAKING: Stable/uncomplicated  EVALUATION COMPLEXITY: Low  GOALS: Goals reviewed with patient? Yes  SHORT TERM GOALS: Target date: 03/01/23  Patient will be independent with initial HEP.  Goal status: 02/01/23 MET   LONG TERM GOALS: Target date: 04/05/23  Patient will be independent with advanced/ongoing HEP to improve outcomes and carryover.  Goal status: INITIAL  2.  Patient will report 75% improvement in R shoulder pain to improve QOL.  Baseline: 5/10 when it hurts Goal status: INITIAL  3.  Patient will be able to reach overhead and behind his back without  pain  Baseline: unable to reach past horizontal plane  Goal status: INITIAL  4.  Patient will demonstrate full AROM of R shoulder without pain provocation to allow for increased ease of ADLs.  Baseline: full ROM but painful Goal status: INITIAL  5.  Patient will report 26 on FOTO (patient outcome measure)  to demonstrate improved functional ability.  Baseline: 57 Goal status: INITIAL  PLAN:  PT FREQUENCY: 1-2x/week  PT DURATION: 10 weeks  PLANNED INTERVENTIONS: Therapeutic exercises, Therapeutic activity, Neuromuscular re-education, Balance training, Gait training, Patient/Family education, Self Care, Joint mobilization, Dry Needling, Electrical stimulation, Spinal manipulation, Spinal mobilization, Cryotherapy, Moist heat, Vasopneumatic device, Ionotophoresis 4mg /ml Dexamethasone, and Manual therapy  PLAN FOR NEXT SESSION: R shoulder mobility and strengthening, STM and we discussed trying DN next session and he agreed. Assess ionto   Steven [name],Steven [name], PTA 02/01/2023, 10:51 AM [facility] [address] [facility] [place], [place], [address] Phone: [phone]   Fax:  [phone]  Patient Details  Name: Steven [name] MRN: [id number] Date of Birth: [birth year] Referring Provider:  Freddrick March, MD  Encounter Date: 02/01/2023   Suanne Marker, PTA 02/01/2023, 10:51 AM  [facility] [address] [place], [place], [address] Phone: [phone]   Fax:  [phone]

## 2023-02-01 NOTE — Telephone Encounter (Signed)
Returned call to Randa Evens (pt's wife). Randa Evens reports that patient continues to have nausea despite Protonix 40 mg BID 30-60 minutes before breakfast and his evening meal. Pt is taking Zofran PRN with minimal relief. Seeking alternative recommendations at this time. Denies any vomiting. Randa Evens also mentioned that Dr. Dorris Fetch, cardiothoracic surgeon, ordered a CT in July and noticed enlarged liver. JoAnne wanted to know if this could be further assessed by our office. Please advise, thanks

## 2023-02-01 NOTE — Telephone Encounter (Signed)
Inbound call from patient's wife stating patient is still very nauseous after 7/24 endoscopy. Patients states they are going out of town tomorrow until 8/17. Patient's wife requesting a call back to discuss further and any options to help. Please advise, thank you.

## 2023-02-04 ENCOUNTER — Other Ambulatory Visit: Payer: Self-pay | Admitting: *Deleted

## 2023-02-04 DIAGNOSIS — R110 Nausea: Secondary | ICD-10-CM

## 2023-02-04 NOTE — Telephone Encounter (Signed)
Called patient to notify of information and recommendations per Dr. Barron Alvine. Ordered HIDA scan per MD, reminded patient to maintain his GI clinic appt as scheduled.

## 2023-02-07 NOTE — Therapy (Signed)
OUTPATIENT PHYSICAL THERAPY SHOULDER    Patient Name: Steven [name] MRN: [id number] DOB:1960-11-24, 62 y.o., male Today's Date: 02/11/2023  END OF SESSION:  PT End of Session - 02/11/23 0844     Visit Number 4    Date for PT Re-Evaluation 04/05/23    Authorization Type BCBS    PT Start Time [phone]    PT Stop Time 0930    PT Time Calculation (min) 46 min              Past Medical History:  Diagnosis Date   AAA (abdominal aortic aneurysm) (HCC)    Allergy    At risk for sleep apnea    STOP-BANG= 4     SENT TO PCP 04-15-2014   BRBPR (bright red blood per rectum) 07/14/2015   ED (erectile dysfunction) 03/10/2013   Epididymal cyst    LEFT   GERD (gastroesophageal reflux disease)    Heart murmur mild mvp  -- asymptomatic   per pt echo 2005   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 Wanda Plump with URO for benign noduarity in left hemiscrotum (2008) felt to be variant of normal.   Left nephrolithiasis    Testicular pain 12/01/2010   Per Dr. Patsi Sears, thought to be due to epididymal cysts    Thoracic aortic aneurysm (HCC) 04/18/2017   VARICOCELE 02/17/2010   Qualifier: Diagnosis of  By: Etheleen Mayhew CMA Duncan Dull), [name]     Past Surgical History:  Procedure Laterality Date   COLONOSCOPY     COLONOSCOPY WITH PROPOFOL  01-06-2014   POLYPECTOMY   CYSTOSCOPY WITH RETROGRADE PYELOGRAM, URETEROSCOPY AND STENT PLACEMENT Left 04/19/2014   Procedure: CYSTOSCOPY WITH RETROGRADE PYELOGRAM with interpretation, URETEROSCOPY, stone extraction with basket, AND STENT PLACEMENT, implantation of backstop;  Surgeon: Kathi Ludwig, MD;  Location: [facility];  Service: Urology;  Laterality: Left;   EXCISION SEBACEOUS CYST X3 OF BACK  03-13-2010   HOLMIUM LASER APPLICATION Left 04/19/2014   Procedure:  HOLMIUM LASER APPLICATION;  Surgeon: Kathi Ludwig, MD;  Location: [facility];  Service: Urology;  Laterality: Left;   NASAL SEPTUM SURGERY  09-21-2002   POLYPECTOMY     REFRACTIVE SURGERY  1994   Patient Active Problem List   Diagnosis Date Noted   Nausea 10/17/2022   Shoulder pain 10/17/2022   Tinnitus 10/17/2022   Onychomycosis 09/27/2021   Voice hoarseness 03/15/2021   Aortic stenosis, mild 06/07/2020   Thoracic aortic aneurysm (HCC) 04/18/2017   Snoring 04/02/2017   Hyperglycemia 03/10/2013   ED (erectile dysfunction) 03/10/2013   Seborrheic keratoses 02/29/2012   INTERMITTENT VERTIGO 05/08/2010   VARICOCELE 02/17/2010   Hypogonadism in male 02/13/2010   HLD (hyperlipidemia) 02/13/2010   PLANTAR FASCIITIS 02/13/2010    PCP: Crawford Givens  REFERRING PROVIDER: Freddrick March   REFERRING DIAG:  M75.41 (ICD-10-CM) - Impingement syndrome of right shoulder    THERAPY DIAG:  Acute pain of right shoulder  Stiffness of right shoulder, not elsewhere classified  Abnormal posture  Muscle weakness (generalized)  Rationale for Evaluation and Treatment: Rehabilitation  ONSET DATE: 01/10/23  SUBJECTIVE:  SUBJECTIVE STATEMENT: Feels about the same   Hand dominance: Right  PERTINENT HISTORY: N/A  PAIN:  Are you having pain? Yes: NPRS scale: 3-4/10 Pain location: posterior R shoulder Pain description: only hurts if I move it backwards Aggravating factors: reaching behind Relieving factors: nothing really   PRECAUTIONS: None  RED FLAGS: None   WEIGHT BEARING RESTRICTIONS: No  FALLS:  Has patient fallen in last 6 months? No  LIVING ENVIRONMENT: Lives with: lives with their family and lives with their spouse Lives in: House/apartment  OCCUPATION: Tax  Airline pilot, Passenger transport manager mostly   PLOF: Independent  PATIENT GOALS: no pain   NEXT MD VISIT:   OBJECTIVE:   DIAGNOSTIC FINDINGS:  FINDINGS: AC joint degenerative change. No fracture or dislocation. No other abnormalities.   IMPRESSION: AC joint degenerative changes.  PATIENT SURVEYS:  FOTO 57  COGNITION: Overall cognitive status: Within functional limits for tasks assessed     SENSATION: WFL  POSTURE: Rounded shoulders  UPPER EXTREMITY ROM: able to get full range but painful at end ranges with flexion and IR and pain halfway through motion for abduction and ER    UPPER EXTREMITY MMT: 5/5 except R shoulder abd 3+ with pain    SHOULDER SPECIAL TESTS: Impingement tests: Neer impingement test: negative, Hawkins/Kennedy impingement test: positive , and Painful arc test: positive   JOINT MOBILITY TESTING:  Full ROM but muscle tightness and pain  PALPATION:  Very tight in R upper trap   TODAY'S TREATMENT:                                                                                                                                         DATE:  02/11/23 UBE L3 x24mins each way Shoulder ext 10# 2x10 Cable rows 15# 2x10 Rows and lats 35# 2x10 Horizontal abd green 2x10  IYT 3# 2x10  DN to bilateral upper traps  Shoulder shrugs x10 Arm circles forwards and backwards x10 MH to upper traps   02/01/23 UBE L 2 2 min fwd/2 min backward Cable pulleys 15# rows 2 sets 10, shld ext 10# 2 sets 10 Upright row 20# 2 sets 10 PNF RT UE 3# 2 sets 10 Empty can RT UE 3# 2 sets 10 RT UE ER at 90/90 5# 2 sets 10 - some pain Shld flex into abd and lower then reverse 10 each 3# STW with TP release to post shld/trap/rhomboid  Theragun to above R UE GH jt mobs grade 3 Educ on desk ergonomics and supporting RT UE when using keyboard and mouse to unload and support shld Ionto RT post shld 1.2 cc dex 4 mA 4 hour patch- educ on use ,safety and possible SE of  ionto   01/29/23 UBE L2 x3 min  AAROM 3lb WaTE flex, Ext, IR  Rows & Ext blue 2x10 RUE ER/IR green 2x10 Triceps Ext 35lb 3x15 RUE PROM in all directions  with end range holds  [facility] jt mobs grade 3  01/25/23- EVAL   PATIENT EDUCATION: Education details: POC and HEP Person educated: Patient Education method: Explanation Education comprehension: verbalized understanding  HOME EXERCISE PROGRAM: Access Code: [email or web address] URL: [email or web address] Date: 01/25/2023 Prepared by: Cassie Freer  Exercises - Standing Shoulder Row with Anchored Resistance  - 1 x daily - 7 x weekly - 2 sets - 10 reps - Shoulder extension with resistance - Neutral  - 1 x daily - 7 x weekly - 2 sets - 10 reps - Shoulder External Rotation and Scapular Retraction with Resistance  - 1 x daily - 7 x weekly - 2 sets - 10 reps - Standing Shoulder Horizontal Abduction with Resistance  - 1 x daily - 7 x weekly - 2 sets - 10 reps - Doorway Pec Stretch at 60 Degrees Abduction with Arm Straight  - 1 x daily - 7 x weekly - 30 hold - Doorway Rhomboid Stretch  - 1 x daily - 7 x weekly - 30 hold  ASSESSMENT:  CLINICAL IMPRESSION: Pt arrive with pain isolated in post shld-fwd/rounded shld. Tightness and TP in post shld/trap/rhom. Shld strengthening with cuing- verb and tactile needed with some pain in R shoulder and deltoid. Pain with lateral raises. Focused mostly on posterior strengthening for posture and impingement. Dry needling done by lead therapist, had some good jump signs and twitching on both sides, more on the R than the left.    OBJECTIVE IMPAIRMENTS: decreased strength, impaired UE functional use, and pain.   ACTIVITY LIMITATIONS: reach over head and reaching arm back  REHAB POTENTIAL: Good  CLINICAL DECISION MAKING: Stable/uncomplicated  EVALUATION COMPLEXITY: Low  GOALS: Goals reviewed with patient? Yes  SHORT TERM GOALS: Target date: 03/01/23  Patient will be independent with initial HEP.   Goal status: 02/01/23 MET   LONG TERM GOALS: Target date: 04/05/23  Patient will be independent with advanced/ongoing HEP to improve outcomes and carryover.  Goal status: INITIAL  2.  Patient will report 75% improvement in R shoulder pain to improve QOL.  Baseline: 5/10 when it hurts Goal status: INITIAL  3.  Patient will be able to reach overhead and behind his back without pain  Baseline: unable to reach past horizontal plane  Goal status: INITIAL  4.  Patient will demonstrate full AROM of R shoulder without pain provocation to allow for increased ease of ADLs.  Baseline: full ROM but painful Goal status: INITIAL  5.  Patient will report 53 on FOTO (patient outcome measure)  to demonstrate improved functional ability.  Baseline: 57 Goal status: INITIAL  PLAN:  PT FREQUENCY: 1-2x/week  PT DURATION: 10 weeks  PLANNED INTERVENTIONS: Therapeutic exercises, Therapeutic activity, Neuromuscular re-education, Balance training, Gait training, Patient/Family education, Self Care, Joint mobilization, Dry Needling, Electrical stimulation, Spinal manipulation, Spinal mobilization, Cryotherapy, Moist heat, Vasopneumatic device, Ionotophoresis 4mg /ml Dexamethasone, and Manual therapy  PLAN FOR NEXT SESSION: R shoulder mobility and strengthening, STM and we discussed trying DN next session and he agreed. Assess ionto   Cassie Freer, [place] 02/11/2023, 9:31 AM [facility] Out[facility] Rehabilitation at [facility] [address] [facility] [place], [place], [address] Phone: [phone]   Fax:  [phone]  Patient Details  Name: TADEUSZ NEALY MRN: [id number] Date of Birth: 1960/11/13 Referring Provider:  Freddrick March, MD  Encounter Date: 02/11/2023   Cassie Freer, PT 02/11/2023, 9:31 AM  [facility] at [facility] [address] [facility]  [place], [place], [address] Phone: [phone]   Fax:  [phone]

## 2023-02-11 ENCOUNTER — Ambulatory Visit: Payer: BC Managed Care – PPO

## 2023-02-11 DIAGNOSIS — M25511 Pain in right shoulder: Secondary | ICD-10-CM

## 2023-02-11 DIAGNOSIS — M6281 Muscle weakness (generalized): Secondary | ICD-10-CM

## 2023-02-11 DIAGNOSIS — R293 Abnormal posture: Secondary | ICD-10-CM

## 2023-02-11 DIAGNOSIS — M25611 Stiffness of right shoulder, not elsewhere classified: Secondary | ICD-10-CM | POA: Diagnosis not present

## 2023-02-12 NOTE — Therapy (Signed)
OUTPATIENT PHYSICAL THERAPY SHOULDER    Patient Name: Steven [name] MRN: [id number] DOB:22-Jul-1960, 62 y.o., male Today's Date: 02/13/2023  END OF SESSION:  PT End of Session - 02/13/23 0842     Visit Number 5    Date for PT Re-Evaluation 04/05/23    Authorization Type BCBS    PT Start Time [phone]    PT Stop Time 0925    PT Time Calculation (min) 43 min               Past Medical History:  Diagnosis Date   AAA (abdominal aortic aneurysm) (HCC)    Allergy    At risk for sleep apnea    STOP-BANG= 4     SENT TO PCP 04-15-2014   BRBPR (bright red blood per rectum) 07/14/2015   ED (erectile dysfunction) 03/10/2013   Epididymal cyst    LEFT   GERD (gastroesophageal reflux disease)    Heart murmur mild mvp  -- asymptomatic   per pt echo 2005   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 Wanda Plump with URO for benign noduarity in left hemiscrotum (2008) felt to be variant of normal.   Left nephrolithiasis    Testicular pain 12/01/2010   Per Dr. Patsi Sears, thought to be due to epididymal cysts    Thoracic aortic aneurysm (HCC) 04/18/2017   VARICOCELE 02/17/2010   Qualifier: Diagnosis of  By: Etheleen Mayhew CMA Duncan Dull), [name]     Past Surgical History:  Procedure Laterality Date   COLONOSCOPY     COLONOSCOPY WITH PROPOFOL  01-06-2014   POLYPECTOMY   CYSTOSCOPY WITH RETROGRADE PYELOGRAM, URETEROSCOPY AND STENT PLACEMENT Left 04/19/2014   Procedure: CYSTOSCOPY WITH RETROGRADE PYELOGRAM with interpretation, URETEROSCOPY, stone extraction with basket, AND STENT PLACEMENT, implantation of backstop;  Surgeon: Kathi Ludwig, MD;  Location: [facility];  Service: Urology;  Laterality: Left;   EXCISION SEBACEOUS CYST X3 OF BACK  03-13-2010   HOLMIUM LASER APPLICATION Left 04/19/2014   Procedure:  HOLMIUM LASER APPLICATION;  Surgeon: Kathi Ludwig, MD;  Location: [facility];  Service: Urology;  Laterality: Left;   NASAL SEPTUM SURGERY  09-21-2002   POLYPECTOMY     REFRACTIVE SURGERY  1994   Patient Active Problem List   Diagnosis Date Noted   Nausea 10/17/2022   Shoulder pain 10/17/2022   Tinnitus 10/17/2022   Onychomycosis 09/27/2021   Voice hoarseness 03/15/2021   Aortic stenosis, mild 06/07/2020   Thoracic aortic aneurysm (HCC) 04/18/2017   Snoring 04/02/2017   Hyperglycemia 03/10/2013   ED (erectile dysfunction) 03/10/2013   Seborrheic keratoses 02/29/2012   INTERMITTENT VERTIGO 05/08/2010   VARICOCELE 02/17/2010   Hypogonadism in male 02/13/2010   HLD (hyperlipidemia) 02/13/2010   PLANTAR FASCIITIS 02/13/2010    PCP: Crawford Givens  REFERRING PROVIDER: Freddrick March   REFERRING DIAG:  M75.41 (ICD-10-CM) - Impingement syndrome of right shoulder    THERAPY DIAG:  Acute pain of right shoulder  Stiffness of right shoulder, not elsewhere classified  Abnormal posture  Muscle weakness (generalized)  Rationale for Evaluation and Treatment: Rehabilitation  ONSET DATE: 01/10/23  SUBJECTIVE:  SUBJECTIVE STATEMENT: Surpisingly a little better after last time. Seem to have more motion in my shoulder.   Hand dominance: Right  PERTINENT HISTORY: N/A  PAIN:  Are you having pain? Yes: NPRS scale: 3-4/10 Pain location: posterior R shoulder Pain description: only hurts if I move it backwards Aggravating factors: reaching behind Relieving factors: nothing really   PRECAUTIONS: None  RED FLAGS: None   WEIGHT BEARING RESTRICTIONS: No  FALLS:  Has patient fallen in last 6 months? No  LIVING ENVIRONMENT: Lives with: lives with their family and lives with  their spouse Lives in: House/apartment  OCCUPATION: Tax Airline pilot, Passenger transport manager mostly   PLOF: Independent  PATIENT GOALS: no pain   NEXT MD VISIT:   OBJECTIVE:   DIAGNOSTIC FINDINGS:  FINDINGS: AC joint degenerative change. No fracture or dislocation. No other abnormalities.   IMPRESSION: AC joint degenerative changes.  PATIENT SURVEYS:  FOTO 57  COGNITION: Overall cognitive status: Within functional limits for tasks assessed     SENSATION: WFL  POSTURE: Rounded shoulders  UPPER EXTREMITY ROM: able to get full range but painful at end ranges with flexion and IR and pain halfway through motion for abduction and ER    UPPER EXTREMITY MMT: 5/5 except R shoulder abd 3+ with pain    SHOULDER SPECIAL TESTS: Impingement tests: Neer impingement test: negative, Hawkins/Kennedy impingement test: positive , and Painful arc test: positive   JOINT MOBILITY TESTING:  Full ROM but muscle tightness and pain  PALPATION:  Very tight in R upper trap   TODAY'S TREATMENT:                                                                                                                                         DATE:  02/13/23 UBE L3 x41mins  Blue band shoulder flexion into ER 2x10 IR/ER with cable 5# 2x10 Pec stretch in doorway 30s x2 3 way shoulder reaches green x10  3 way shoulder ext against wall 3# 2x10 Farmer's carry 20# 2 laps  Over head carry 10# 2 laps  PROM to R shoulder and neck  Passive neck stretches    02/11/23 UBE L3 x53mins each way Shoulder ext 10# 2x10 Cable rows 15# 2x10 Rows and lats 35# 2x10 Horizontal abd green 2x10  IYT 3# 2x10  DN to bilateral upper traps  Shoulder shrugs x10 Arm circles forwards and backwards x10 MH to upper traps   02/01/23 UBE L 2 2 min fwd/2 min backward Cable pulleys 15# rows 2 sets 10, shld ext 10# 2 sets 10 Upright row 20# 2 sets 10 PNF RT UE 3# 2 sets 10 Empty can RT UE 3# 2 sets10 RT UE ER at 90/90 5# 2 sets  10 - some pain Shld flex into abd and lower then reverse 10 each 3# STW with TP release to post shld/trap/rhomboid  Theragun to above R UE GH jt mobs  grade 3 Educ on desk ergonomics and supporting RT UE when using keyboard and mouse to unload and support shld Ionto RT post shld 1.2 cc dex 4 mA 4 hour patch- educ on use ,safety and possible SE of ionto   01/29/23 UBE L2 x3 min  AAROM 3lb WaTE flex, Ext, IR  Rows & Ext blue 2x10 RUE ER/IR green 2x10 Triceps Ext 35lb 3x15 RUE PROM in all directions with end range holds  Rue GH jt mobs grade 3  01/25/23- EVAL   PATIENT EDUCATION: Education details: POC and HEP Person educated: Patient Education method: Explanation Education comprehension: verbalized understanding  HOME EXERCISE PROGRAM: Access Code: [email or web address] URL: [email or web address] Date: 01/25/2023 Prepared by: Cassie Freer  Exercises - Standing Shoulder Row with Anchored Resistance  - 1 x daily - 7 x weekly - 2 sets - 10 reps - Shoulder extension with resistance - Neutral  - 1 x daily - 7 x weekly - 2 sets - 10 reps - Shoulder External Rotation and Scapular Retraction with Resistance  - 1 x daily - 7 x weekly - 2 sets - 10 reps - Standing Shoulder Horizontal Abduction with Resistance  - 1 x daily - 7 x weekly - 2 sets - 10 reps - Doorway Pec Stretch at 60 Degrees Abduction with Arm Straight  - 1 x daily - 7 x weekly - 30 hold - Doorway Rhomboid Stretch  - 1 x daily - 7 x weekly - 30 hold  ASSESSMENT:  CLINICAL IMPRESSION: Pt arrive with pain isolated in post shld-fwd/rounded shld. Very tight and painful with pec stretch in doorway. Reports some pain with most exercises but not enough to stop him from doing it. Pain is located along R anterior deltoid.  Shoulder strengthening with cuing- verb and tactile needed. Difficulty with overhead carry, noticed some compensations and shaking. Still super tight in upper traps.   OBJECTIVE IMPAIRMENTS: decreased strength,  impaired UE functional use, and pain.   ACTIVITY LIMITATIONS: reach over head and reaching arm back  REHAB POTENTIAL: Good  CLINICAL DECISION MAKING: Stable/uncomplicated  EVALUATION COMPLEXITY: Low  GOALS: Goals reviewed with patient? Yes  SHORT TERM GOALS: Target date: 03/01/23  Patient will be independent with initial HEP.  Goal status: 02/01/23 MET   LONG TERM GOALS: Target date: 04/05/23  Patient will be independent with advanced/ongoing HEP to improve outcomes and carryover.  Goal status: INITIAL  2.  Patient will report 75% improvement in R shoulder pain to improve QOL.  Baseline: 5/10 when it hurts Goal status: INITIAL  3.  Patient will be able to reach overhead and behind his back without pain  Baseline: unable to reach past horizontal plane  Goal status: INITIAL  4.  Patient will demonstrate full AROM of R shoulder without pain provocation to allow for increased ease of ADLs.  Baseline: full ROM but painful Goal status: INITIAL  5.  Patient will report 75 on FOTO (patient outcome measure)  to demonstrate improved functional ability.  Baseline: 57 Goal status: INITIAL  PLAN:  PT FREQUENCY: 1-2x/week  PT DURATION: 10 weeks  PLANNED INTERVENTIONS: Therapeutic exercises, Therapeutic activity, Neuromuscular re-education, Balance training, Gait training, Patient/Family education, Self Care, Joint mobilization, Dry Needling, Electrical stimulation, Spinal manipulation, Spinal mobilization, Cryotherapy, Moist heat, Vasopneumatic device, Ionotophoresis 4mg /ml Dexamethasone, and Manual therapy  PLAN FOR NEXT SESSION: R shoulder mobility and strengthening, STM and we discussed trying DN next session and he agreed. Assess ionto   Shafer, [place] 02/13/2023, 9:23 AM [place]  [facility] Outpatient Rehabilitation at [facility] [address] [facility]. [place], [place], [address] Phone: [phone]   Fax:  [phone]  Patient Details  Name: Steven [name] MRN:  [id number] Date of Birth: 09-05-1960 Referring Provider:  Freddrick March, MD  Encounter Date: 02/13/2023   Cassie Freer, PT 02/13/2023, 9:23 AM  [facility] at [facility] [address] [facility] [place], [place], [address] Phone: [phone]   Fax:  [phone]

## 2023-02-13 ENCOUNTER — Ambulatory Visit: Payer: BC Managed Care – PPO

## 2023-02-13 DIAGNOSIS — R293 Abnormal posture: Secondary | ICD-10-CM | POA: Diagnosis not present

## 2023-02-13 DIAGNOSIS — M25511 Pain in right shoulder: Secondary | ICD-10-CM | POA: Diagnosis not present

## 2023-02-13 DIAGNOSIS — M25611 Stiffness of right shoulder, not elsewhere classified: Secondary | ICD-10-CM | POA: Diagnosis not present

## 2023-02-13 DIAGNOSIS — M6281 Muscle weakness (generalized): Secondary | ICD-10-CM

## 2023-02-15 NOTE — Therapy (Signed)
OUTPATIENT PHYSICAL THERAPY SHOULDER    Patient Name: Steven [name] MRN: [id number] DOB:25-Feb-1961, 62 y.o., male Today's Date: 02/18/2023  END OF SESSION:  PT End of Session - 02/18/23 0800     Visit Number 6    Date for PT Re-Evaluation 04/05/23    Authorization Type BCBS    PT Start Time 0800    PT Stop Time 0845    PT Time Calculation (min) 45 min                Past Medical History:  Diagnosis Date   AAA (abdominal aortic aneurysm) (HCC)    Allergy    At risk for sleep apnea    STOP-BANG= 4     SENT TO PCP 04-15-2014   BRBPR (bright red blood per rectum) 07/14/2015   ED (erectile dysfunction) 03/10/2013   Epididymal cyst    LEFT   GERD (gastroesophageal reflux disease)    Heart murmur mild mvp  -- asymptomatic   per pt echo 2005   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 Wanda Plump with URO for benign noduarity in left hemiscrotum (2008) felt to be variant of normal.   Left nephrolithiasis    Testicular pain 12/01/2010   Per Dr. Patsi Sears, thought to be due to epididymal cysts    Thoracic aortic aneurysm (HCC) 04/18/2017   VARICOCELE 02/17/2010   Qualifier: Diagnosis of  By: Etheleen Mayhew CMA Duncan Dull), Steven [name]     Past Surgical History:  Procedure Laterality Date   COLONOSCOPY     COLONOSCOPY WITH PROPOFOL  01-06-2014   POLYPECTOMY   CYSTOSCOPY WITH RETROGRADE PYELOGRAM, URETEROSCOPY AND STENT PLACEMENT Left 04/19/2014   Procedure: CYSTOSCOPY WITH RETROGRADE PYELOGRAM with interpretation, URETEROSCOPY, stone extraction with basket, AND STENT PLACEMENT, implantation of backstop;  Surgeon: Kathi Ludwig, MD;  Location: [facility];  Service: Urology;  Laterality: Left;   EXCISION SEBACEOUS CYST X3 OF BACK  03-13-2010   HOLMIUM LASER APPLICATION Left 04/19/2014   Procedure:  HOLMIUM LASER APPLICATION;  Surgeon: Kathi Ludwig, MD;  Location: [facility];  Service: Urology;  Laterality: Left;   NASAL SEPTUM SURGERY  09-21-2002   POLYPECTOMY     REFRACTIVE SURGERY  1994   Patient Active Problem List   Diagnosis Date Noted   Nausea 10/17/2022   Shoulder pain 10/17/2022   Tinnitus 10/17/2022   Onychomycosis 09/27/2021   Voice hoarseness 03/15/2021   Aortic stenosis, mild 06/07/2020   Thoracic aortic aneurysm (HCC) 04/18/2017   Snoring 04/02/2017   Hyperglycemia 03/10/2013   ED (erectile dysfunction) 03/10/2013   Seborrheic keratoses 02/29/2012   INTERMITTENT VERTIGO 05/08/2010   VARICOCELE 02/17/2010   Hypogonadism in male 02/13/2010   HLD (hyperlipidemia) 02/13/2010   PLANTAR FASCIITIS 02/13/2010    PCP: Crawford Givens  REFERRING PROVIDER: Freddrick March   REFERRING DIAG:  M75.41 (ICD-10-CM) - Impingement syndrome of right shoulder    THERAPY DIAG:  Acute pain of right shoulder  Stiffness of right shoulder, not elsewhere classified  Abnormal posture  Muscle weakness (generalized)  Rationale for Evaluation and Treatment: Rehabilitation  ONSET DATE: 01/10/23  SUBJECTIVE:  SUBJECTIVE STATEMENT: I have a little bit more reach but it does still hurt when I move it.   Hand dominance: Right  PERTINENT HISTORY: N/A  PAIN:  Are you having pain? Yes: NPRS scale: 3-4/10 Pain location: posterior R shoulder Pain description: only hurts if I move it backwards Aggravating factors: reaching behind Relieving factors: nothing really   PRECAUTIONS: None  RED FLAGS: None   WEIGHT BEARING RESTRICTIONS: No  FALLS:  Has patient fallen in last 6 months? No  LIVING ENVIRONMENT: Lives with: lives with their family and lives with their spouse Lives  in: House/apartment  OCCUPATION: [employer] pilot, Passenger transport manager mostly   PLOF: Independent  PATIENT GOALS: no pain   NEXT MD VISIT:   OBJECTIVE:   DIAGNOSTIC FINDINGS:  FINDINGS: AC joint degenerative change. No fracture or dislocation. No other abnormalities.   IMPRESSION: AC joint degenerative changes.  PATIENT SURVEYS:  FOTO 57  COGNITION: Overall cognitive status: Within functional limits for tasks assessed     SENSATION: WFL  POSTURE: Rounded shoulders  UPPER EXTREMITY ROM: able to get full range but painful at end ranges with flexion and IR and pain halfway through motion for abduction and ER    UPPER EXTREMITY MMT: 5/5 except R shoulder abd 3+ with pain    SHOULDER SPECIAL TESTS: Impingement tests: Neer impingement test: negative, Hawkins/Kennedy impingement test: positive , and Painful arc test: positive   JOINT MOBILITY TESTING:  Full ROM but muscle tightness and pain  PALPATION:  Very tight in R upper trap   TODAY'S TREATMENT:                                                                                                                                         DATE:  02/18/23 UBE L4 x3 mins each way  Tricep exts 35# 2x10 Shoulder ext 10# 2x10 Cable rows 10# 2x10 Bicep curls 25# 2x10 Horizontal abd green 2x10 Green band SA lifts 2x10 RT UE ER at 90/90 5# 2x10 OHP blue ball 2x10  Overhead extensions against wall with blue ball 2x10 PROM to R shoulder and neck  MET ER to R shoulder x3    02/13/23 UBE L3 x58mins  Blue band shoulder flexion into ER 2x10 IR/ER with cable 5# 2x10 Pec stretch in doorway 30s x2 3 way shoulder reaches green x10  3 way shoulder ext against wall 3# 2x10 Farmer's carry 20# 2 laps  Over head carry 10# 2 laps  PROM to R shoulder and neck  Passive neck stretches    02/11/23 UBE L3 x45mins each way Shoulder ext 10# 2x10 Cable rows 15# 2x10 Rows and lats 35# 2x10 Horizontal abd green 2x10  IYT 3# 2x10  DN to  bilateral upper traps  Shoulder shrugs x10 Arm circles forwards and backwards x10 MH to upper traps   02/01/23 UBE L 2 2 min fwd/2 min backward Cable pulleys  15# rows 2 sets 10, shld ext 10# 2 sets 10 Upright row 20# 2 sets 10 PNF RT UE 3# 2 sets 10 Empty can RT UE 3# 2 sets10 RT UE ER at 90/90 5# 2 sets 10 - some pain Shld flex into abd and lower then reverse 10 each 3# STW with TP release to post shld/trap/rhomboid  Theragun to above R UE GH jt mobs grade 3 Educ on desk ergonomics and supporting RT UE when using keyboard and mouse to unload and support shld Ionto RT post shld 1.2 cc dex 4 mA 4 hour patch- educ on use ,safety and possible SE of ionto   01/29/23 UBE L2 x3 min  AAROM 3lb WaTE flex, Ext, IR  Rows & Ext blue 2x10 RUE ER/IR green 2x10 Triceps Ext 35lb 3x15 RUE PROM in all directions with end range holds  Rue GH jt mobs grade 3  01/25/23- EVAL   PATIENT EDUCATION: Education details: POC and HEP Person educated: Patient Education method: Explanation Education comprehension: verbalized understanding  HOME EXERCISE PROGRAM: Access Code: [email or web address] URL: [email or web address] Date: 01/25/2023 Prepared by: Cassie Freer  Exercises - Standing Shoulder Row with Anchored Resistance  - 1 x daily - 7 x weekly - 2 sets - 10 reps - Shoulder extension with resistance - Neutral  - 1 x daily - 7 x weekly - 2 sets - 10 reps - Shoulder External Rotation and Scapular Retraction with Resistance  - 1 x daily - 7 x weekly - 2 sets - 10 reps - Standing Shoulder Horizontal Abduction with Resistance  - 1 x daily - 7 x weekly - 2 sets - 10 reps - Doorway Pec Stretch at 60 Degrees Abduction with Arm Straight  - 1 x daily - 7 x weekly - 30 hold - Doorway Rhomboid Stretch  - 1 x daily - 7 x weekly - 30 hold  ASSESSMENT:  CLINICAL IMPRESSION: Pt arrive with pain isolated in posterior shoulder. Shoulder strengthening with cuing- verbal and tactile needed with reports  of some pain with exercises. Difficulty with overhead banded lifts, noticed shaking in RUE. Limited ER with 90/90 intervention due to pain. Most pain with ER PROM, unable to get into full ranges. Tried a MET and was able to push a little bit further.   OBJECTIVE IMPAIRMENTS: decreased strength, impaired UE functional use, and pain.   ACTIVITY LIMITATIONS: reach over head and reaching arm back  REHAB POTENTIAL: Good  CLINICAL DECISION MAKING: Stable/uncomplicated  EVALUATION COMPLEXITY: Low  GOALS: Goals reviewed with patient? Yes  SHORT TERM GOALS: Target date: 03/01/23  Patient will be independent with initial HEP.  Goal status: 02/01/23 MET   LONG TERM GOALS: Target date: 04/05/23  Patient will be independent with advanced/ongoing HEP to improve outcomes and carryover.  Goal status: INITIAL  2.  Patient will report 75% improvement in R shoulder pain to improve QOL.  Baseline: 5/10 when it hurts Goal status: INITIAL  3.  Patient will be able to reach overhead and behind his back without pain  Baseline: unable to reach past horizontal plane  Goal status: INITIAL  4.  Patient will demonstrate full AROM of R shoulder without pain provocation to allow for increased ease of ADLs.  Baseline: see above Goal status: IN PROGRESS pain at end range for flexion and abd, pain with ER 02/18/23  5.  Patient will report 33 on FOTO (patient outcome measure)  to demonstrate improved functional ability.  Baseline: 57 Goal status: INITIAL  PLAN:  PT FREQUENCY: 1-2x/week  PT DURATION: 10 weeks  PLANNED INTERVENTIONS: Therapeutic exercises, Therapeutic activity, Neuromuscular re-education, Balance training, Gait training, Patient/Family education, Self Care, Joint mobilization, Dry Needling, Electrical stimulation, Spinal manipulation, Spinal mobilization, Cryotherapy, Moist heat, Vasopneumatic device, Ionotophoresis 4mg /ml Dexamethasone, and Manual therapy  PLAN FOR NEXT SESSION: R shoulder  mobility and strengthening   Cassie Freer, PT 02/18/2023, 8:40 AM [place] [facility] at [facility] [address] [facility] [place], [place], [address] Phone: [phone]   Fax:  [phone]  Patient Details  Name: SHMAR FOCHS MRN: [id number] Date of Birth: Nov 24, 1960 Referring Provider:  Freddrick March, MD  Encounter Date: 02/18/2023   Cassie Freer, PT 02/18/2023, 8:40 AM  [place] [facility] at [facility] [address] [place], [place], [address] Phone: [phone]   Fax:  [phone]

## 2023-02-18 ENCOUNTER — Ambulatory Visit: Payer: BC Managed Care – PPO

## 2023-02-18 DIAGNOSIS — M6281 Muscle weakness (generalized): Secondary | ICD-10-CM | POA: Diagnosis not present

## 2023-02-18 DIAGNOSIS — M25511 Pain in right shoulder: Secondary | ICD-10-CM

## 2023-02-18 DIAGNOSIS — R293 Abnormal posture: Secondary | ICD-10-CM | POA: Diagnosis not present

## 2023-02-18 DIAGNOSIS — M25611 Stiffness of right shoulder, not elsewhere classified: Secondary | ICD-10-CM | POA: Diagnosis not present

## 2023-02-19 NOTE — Therapy (Signed)
OUTPATIENT PHYSICAL THERAPY SHOULDER    Patient Name: Steven [name] MRN: [id number] DOB:1961/03/20, 62 y.o., male Today's Date: 02/20/2023  END OF SESSION:  PT End of Session - 02/20/23 0844     Visit Number 7    Date for PT Re-Evaluation 04/05/23    Authorization Type BCBS    PT Start Time 0845    PT Stop Time 0930    PT Time Calculation (min) 45 min                 Past Medical History:  Diagnosis Date   AAA (abdominal aortic aneurysm) (HCC)    Allergy    At risk for sleep apnea    STOP-BANG= 4     SENT TO PCP 04-15-2014   BRBPR (bright red blood per rectum) 07/14/2015   ED (erectile dysfunction) 03/10/2013   Epididymal cyst    LEFT   GERD (gastroesophageal reflux disease)    Heart murmur mild mvp  -- asymptomatic   per pt echo 2005   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 Wanda Plump with URO for benign noduarity in left hemiscrotum (2008) felt to be variant of normal.   Left nephrolithiasis    Testicular pain 12/01/2010   Per Dr. Patsi Sears, thought to be due to epididymal cysts    Thoracic aortic aneurysm (HCC) 04/18/2017   VARICOCELE 02/17/2010   Qualifier: Diagnosis of  By: Etheleen Mayhew CMA Duncan Dull), [name]     Past Surgical History:  Procedure Laterality Date   COLONOSCOPY     COLONOSCOPY WITH PROPOFOL  01-06-2014   POLYPECTOMY   CYSTOSCOPY WITH RETROGRADE PYELOGRAM, URETEROSCOPY AND STENT PLACEMENT Left 04/19/2014   Procedure: CYSTOSCOPY WITH RETROGRADE PYELOGRAM with interpretation, URETEROSCOPY, stone extraction with basket, AND STENT PLACEMENT, implantation of backstop;  Surgeon: Kathi Ludwig, MD;  Location: [facility];  Service: Urology;  Laterality: Left;   EXCISION SEBACEOUS CYST X3 OF BACK  03-13-2010   HOLMIUM LASER APPLICATION Left 04/19/2014   Procedure:  HOLMIUM LASER APPLICATION;  Surgeon: Kathi Ludwig, MD;  Location: [facility];  Service: Urology;  Laterality: Left;   NASAL SEPTUM SURGERY  09-21-2002   POLYPECTOMY     REFRACTIVE SURGERY  1994   Patient Active Problem List   Diagnosis Date Noted   Nausea 10/17/2022   Shoulder pain 10/17/2022   Tinnitus 10/17/2022   Onychomycosis 09/27/2021   Voice hoarseness 03/15/2021   Aortic stenosis, mild 06/07/2020   Thoracic aortic aneurysm (HCC) 04/18/2017   Snoring 04/02/2017   Hyperglycemia 03/10/2013   ED (erectile dysfunction) 03/10/2013   Seborrheic keratoses 02/29/2012   INTERMITTENT VERTIGO 05/08/2010   VARICOCELE 02/17/2010   Hypogonadism in male 02/13/2010   HLD (hyperlipidemia) 02/13/2010   PLANTAR FASCIITIS 02/13/2010    PCP: Crawford Givens  REFERRING PROVIDER: Freddrick March   REFERRING DIAG:  M75.41 (ICD-10-CM) - Impingement syndrome of right shoulder    THERAPY DIAG:  Acute pain of right shoulder  Stiffness of right shoulder, not elsewhere classified  Abnormal posture  Muscle weakness (generalized)  Rationale for Evaluation and Treatment: Rehabilitation  ONSET DATE: 01/10/23  SUBJECTIVE:  SUBJECTIVE STATEMENT: Not sure what you did Monday but it sure did hurt, it is okay now. Still hurts right there in the that tendon.   Hand dominance: Right  PERTINENT HISTORY: N/A  PAIN:  Are you having pain? Yes: NPRS scale: 3-4/10 Pain location: posterior R shoulder Pain description: only hurts if I move it backwards Aggravating factors: reaching behind Relieving factors: nothing really   PRECAUTIONS: None  RED FLAGS: None   WEIGHT BEARING RESTRICTIONS: No  FALLS:  Has patient fallen in last 6 months? No  LIVING ENVIRONMENT: Lives with: lives with their  family and lives with their spouse Lives in: House/apartment  OCCUPATION: [employer] pilot, Passenger transport manager mostly   PLOF: Independent  PATIENT GOALS: no pain   NEXT MD VISIT:   OBJECTIVE:   DIAGNOSTIC FINDINGS:  FINDINGS: AC joint degenerative change. No fracture or dislocation. No other abnormalities.   IMPRESSION: AC joint degenerative changes.  PATIENT SURVEYS:  FOTO 57  COGNITION: Overall cognitive status: Within functional limits for tasks assessed     SENSATION: WFL  POSTURE: Rounded shoulders  UPPER EXTREMITY ROM: able to get full range but painful at end ranges with flexion and IR and pain halfway through motion for abduction and ER    UPPER EXTREMITY MMT: 5/5 except R shoulder abd 3+ with pain    SHOULDER SPECIAL TESTS: Impingement tests: Neer impingement test: negative, Hawkins/Kennedy impingement test: positive , and Painful arc test: positive   JOINT MOBILITY TESTING:  Full ROM but muscle tightness and pain  PALPATION:  Very tight in R upper trap   TODAY'S TREATMENT:                                                                                                                                         DATE:  02/20/23 UBE L3 x3 mins each way  Pec stretch 30s  Seated rows and lat 35# 2x10  Chest press 20# 2x10 Shoulder ext 10# 2x10  Green band ER/IR 2x10 Diagonals with red band 2x10 Bent over row 4# 2x10 STW with TP release to post shld/trap/rhomboid  Theragun to above R UE GH jt mobs grade 3   02/18/23 UBE L4 x3 mins each way  Tricep exts 35# 2x10 Shoulder ext 10# 2x10 Cable rows 10# 2x10 Bicep curls 25# 2x10 Horizontal abd green 2x10 Green band SA lifts 2x10 RT UE ER at 90/90 5# 2x10 OHP blue ball 2x10  Overhead extensions against wall with blue ball 2x10 PROM to R shoulder and neck  MET ER to R shoulder x3    02/13/23 UBE L3 x20mins  Blue band shoulder flexion into ER 2x10 IR/ER with cable 5# 2x10 Pec stretch in doorway 30s  x2 3 way shoulder reaches green x10  3 way shoulder ext against wall 3# 2x10 Farmer's carry 20# 2 laps  Over head carry 10# 2 laps  PROM to R shoulder and  neck  Passive neck stretches    02/11/23 UBE L3 x83mins each way Shoulder ext 10# 2x10 Cable rows 15# 2x10 Rows and lats 35# 2x10 Horizontal abd green 2x10  IYT 3# 2x10  DN to bilateral upper traps  Shoulder shrugs x10 Arm circles forwards and backwards x10 MH to upper traps   02/01/23 UBE L 2 2 min fwd/2 min backward Cable pulleys 15# rows 2 sets 10, shld ext 10# 2 sets 10 Upright row 20# 2 sets 10 PNF RT UE 3# 2 sets 10 Empty can RT UE 3# 2 sets10 RT UE ER at 90/90 5# 2 sets 10 - some pain Shld flex into abd and lower then reverse 10 each 3# STW with TP release to post shld/trap/rhomboid  Theragun to above R UE GH jt mobs grade 3 Educ on desk ergonomics and supporting RT UE when using keyboard and mouse to unload and support shld Ionto RT post shld 1.2 cc dex 4 mA 4 hour patch- educ on use ,safety and possible SE of ionto   01/29/23 UBE L2 x3 min  AAROM 3lb WaTE flex, Ext, IR  Rows & Ext blue 2x10 RUE ER/IR green 2x10 Triceps Ext 35lb 3x15 RUE PROM in all directions with end range holds  Rue GH jt mobs grade 3  01/25/23- EVAL   PATIENT EDUCATION: Education details: POC and HEP Person educated: Patient Education method: Explanation Education comprehension: verbalized understanding  HOME EXERCISE PROGRAM: Access Code: [email or web address] URL: [email or web address] Date: 01/25/2023 Prepared by: Cassie Freer  Exercises - Standing Shoulder Row with Anchored Resistance  - 1 x daily - 7 x weekly - 2 sets - 10 reps - Shoulder extension with resistance - Neutral  - 1 x daily - 7 x weekly - 2 sets - 10 reps - Shoulder External Rotation and Scapular Retraction with Resistance  - 1 x daily - 7 x weekly - 2 sets - 10 reps - Standing Shoulder Horizontal Abduction with Resistance  - 1 x daily - 7 x weekly - 2  sets - 10 reps - Doorway Pec Stretch at 60 Degrees Abduction with Arm Straight  - 1 x daily - 7 x weekly - 30 hold - Doorway Rhomboid Stretch  - 1 x daily - 7 x weekly - 30 hold  ASSESSMENT:  CLINICAL IMPRESSION: Pt arrive with pain isolated in anterolateral shoulder along deltoid. Shoulder strengthening with cuing- verbal and tactile needed with reports of some pain with exercises. With pec stretch, reports pain having to hold arm 90/90 position. Also has pain with seated rows if he pulls too far back. Still very tight in bilateral upper traps.   OBJECTIVE IMPAIRMENTS: decreased strength, impaired UE functional use, and pain.   ACTIVITY LIMITATIONS: reach over head and reaching arm back  REHAB POTENTIAL: Good  CLINICAL DECISION MAKING: Stable/uncomplicated  EVALUATION COMPLEXITY: Low  GOALS: Goals reviewed with patient? Yes  SHORT TERM GOALS: Target date: 03/01/23  Patient will be independent with initial HEP.  Goal status: 02/01/23 MET   LONG TERM GOALS: Target date: 04/05/23  Patient will be independent with advanced/ongoing HEP to improve outcomes and carryover.  Goal status: INITIAL  2.  Patient will report 75% improvement in R shoulder pain to improve QOL.  Baseline: 5/10 when it hurts Goal status: INITIAL  3.  Patient will be able to reach overhead and behind his back without pain  Baseline: unable to reach past horizontal plane  Goal status: INITIAL  4.  Patient will demonstrate full  AROM of R shoulder without pain provocation to allow for increased ease of ADLs.  Baseline: see above Goal status: IN PROGRESS pain at end range for flexion and abd, pain with ER 02/18/23  5.  Patient will report 69 on FOTO (patient outcome measure)  to demonstrate improved functional ability.  Baseline: 57 Goal status: INITIAL  PLAN:  PT FREQUENCY: 1-2x/week  PT DURATION: 10 weeks  PLANNED INTERVENTIONS: Therapeutic exercises, Therapeutic activity, Neuromuscular re-education,  Balance training, Gait training, Patient/Family education, Self Care, Joint mobilization, Dry Needling, Electrical stimulation, Spinal manipulation, Spinal mobilization, Cryotherapy, Moist heat, Vasopneumatic device, Ionotophoresis 4mg /ml Dexamethasone, and Manual therapy  PLAN FOR NEXT SESSION: R shoulder mobility and strengthening   Cassie Freer, PT 02/20/2023, 9:28 AM [place] [facility] at [facility] [address] [facility] [place], [place], [address] Phone: [phone]   Fax:  [phone]  Patient Details  Name: RAUL HOWALD MRN: [id number] Date of Birth: 1961-06-24 Referring Provider:  Freddrick March, MD  Encounter Date: 02/20/2023   Cassie Freer, PT 02/20/2023, 9:28 AM  [place] [employer] [facility] at [facility] [address] [place], [place], [address] Phone: [phone]   Fax:  [phone]

## 2023-02-20 ENCOUNTER — Ambulatory Visit: Payer: BC Managed Care – PPO

## 2023-02-20 DIAGNOSIS — M6281 Muscle weakness (generalized): Secondary | ICD-10-CM | POA: Diagnosis not present

## 2023-02-20 DIAGNOSIS — M25511 Pain in right shoulder: Secondary | ICD-10-CM | POA: Diagnosis not present

## 2023-02-20 DIAGNOSIS — R293 Abnormal posture: Secondary | ICD-10-CM

## 2023-02-20 DIAGNOSIS — M25611 Stiffness of right shoulder, not elsewhere classified: Secondary | ICD-10-CM | POA: Diagnosis not present

## 2023-02-21 ENCOUNTER — Telehealth: Payer: Self-pay | Admitting: Gastroenterology

## 2023-02-21 ENCOUNTER — Other Ambulatory Visit: Payer: Self-pay | Admitting: Oncology

## 2023-02-21 DIAGNOSIS — Z006 Encounter for examination for normal comparison and control in clinical research program: Secondary | ICD-10-CM

## 2023-02-21 NOTE — Telephone Encounter (Signed)
Inbound call from patient requesting to discus HIDA scan. Patient would like to discuss scheduling. Requesting a call back. Please advise, thank you.

## 2023-02-22 ENCOUNTER — Telehealth: Payer: Self-pay | Admitting: *Deleted

## 2023-02-22 NOTE — Telephone Encounter (Signed)
Called patient to inform that the HIDA scan had been ordered and to expect a call from imaging for scheduling. Patient also informed to maintain his F/U appt on 04/25/23. Patient agreed.

## 2023-02-26 ENCOUNTER — Ambulatory Visit: Payer: BC Managed Care – PPO | Attending: Family Medicine | Admitting: Physical Therapy

## 2023-02-26 ENCOUNTER — Encounter: Payer: Self-pay | Admitting: Physical Therapy

## 2023-02-26 DIAGNOSIS — M25511 Pain in right shoulder: Secondary | ICD-10-CM | POA: Insufficient documentation

## 2023-02-26 DIAGNOSIS — M6281 Muscle weakness (generalized): Secondary | ICD-10-CM | POA: Insufficient documentation

## 2023-02-26 DIAGNOSIS — R293 Abnormal posture: Secondary | ICD-10-CM | POA: Diagnosis not present

## 2023-02-26 DIAGNOSIS — M25611 Stiffness of right shoulder, not elsewhere classified: Secondary | ICD-10-CM | POA: Insufficient documentation

## 2023-02-26 NOTE — Therapy (Signed)
OUTPATIENT PHYSICAL THERAPY SHOULDER    Patient Name: Steven [name] MRN: [id number] DOB:Oct 25, 1960, 62 y.o., male Today's Date: 02/26/2023  END OF SESSION:  PT End of Session - 02/26/23 0844     Visit Number 8    Date for PT Re-Evaluation 04/05/23    PT Start Time 0845    PT Stop Time 0930    PT Time Calculation (min) 45 min    Activity Tolerance Patient tolerated treatment well    Behavior During Therapy [facility] for tasks assessed/performed                 Past Medical History:  Diagnosis Date   AAA (abdominal aortic aneurysm) (HCC)    Allergy    At risk for sleep apnea    STOP-BANG= 4     SENT TO PCP 04-15-2014   BRBPR (bright red blood per rectum) 07/14/2015   ED (erectile dysfunction) 03/10/2013   Epididymal cyst    LEFT   GERD (gastroesophageal reflux disease)    Heart murmur mild mvp  -- asymptomatic   per pt echo 2005   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 Wanda Plump with URO for benign noduarity in left hemiscrotum (2008) felt to be variant of normal.   Left nephrolithiasis    Testicular pain 12/01/2010   Per Dr. Patsi Sears, thought to be due to epididymal cysts    Thoracic aortic aneurysm (HCC) 04/18/2017   VARICOCELE 02/17/2010   Qualifier: Diagnosis of  By: Etheleen Mayhew CMA Duncan Dull), [name]     Past Surgical History:  Procedure Laterality Date   COLONOSCOPY     COLONOSCOPY WITH PROPOFOL  01-06-2014   POLYPECTOMY   CYSTOSCOPY WITH RETROGRADE PYELOGRAM, URETEROSCOPY AND STENT PLACEMENT Left 04/19/2014   Procedure: CYSTOSCOPY WITH RETROGRADE PYELOGRAM with interpretation, URETEROSCOPY, stone extraction with basket, AND STENT PLACEMENT, implantation of backstop;  Surgeon: Kathi Ludwig, MD;  Location: [facility];  Service: Urology;  Laterality: Left;   EXCISION  SEBACEOUS CYST X3 OF BACK  03-13-2010   HOLMIUM LASER APPLICATION Left 04/19/2014   Procedure: HOLMIUM LASER APPLICATION;  Surgeon: Kathi Ludwig, MD;  Location: [facility];  Service: Urology;  Laterality: Left;   NASAL SEPTUM SURGERY  09-21-2002   POLYPECTOMY     REFRACTIVE SURGERY  1994   Patient Active Problem List   Diagnosis Date Noted   Nausea 10/17/2022   Shoulder pain 10/17/2022   Tinnitus 10/17/2022   Onychomycosis 09/27/2021   Voice hoarseness 03/15/2021   Aortic stenosis, mild 06/07/2020   Thoracic aortic aneurysm (HCC) 04/18/2017   Snoring 04/02/2017   Hyperglycemia 03/10/2013   ED (erectile dysfunction) 03/10/2013   Seborrheic keratoses 02/29/2012   INTERMITTENT VERTIGO 05/08/2010   VARICOCELE 02/17/2010   Hypogonadism in male 02/13/2010   HLD (hyperlipidemia) 02/13/2010   PLANTAR FASCIITIS 02/13/2010    PCP: Crawford Givens  REFERRING PROVIDER: Freddrick March   REFERRING DIAG:  M75.41 (ICD-10-CM) - Impingement syndrome of right shoulder    THERAPY DIAG:  Acute pain of right shoulder  Stiffness of right shoulder, not elsewhere classified  Abnormal posture  Muscle weakness (generalized)  Rationale for Evaluation and Treatment: Rehabilitation  ONSET DATE: 01/10/23  SUBJECTIVE:  SUBJECTIVE STATEMENT: A little more motion, can go back a lot farther   Hand dominance: Right  PERTINENT HISTORY: N/A  PAIN:  Are you having pain? Yes: NPRS scale: 6-7/10 Pain location: posterior R shoulder Pain description: only hurts if I move it backwards Aggravating factors: reaching behind Relieving factors: nothing really   PRECAUTIONS: None  RED FLAGS: None   WEIGHT BEARING RESTRICTIONS: No  FALLS:  Has patient fallen in last 6 months? No  LIVING  ENVIRONMENT: Lives with: lives with their family and lives with their spouse Lives in: House/apartment  OCCUPATION: [employer] pilot, Passenger transport manager mostly   PLOF: Independent  PATIENT GOALS: no pain   NEXT MD VISIT:   OBJECTIVE:   DIAGNOSTIC FINDINGS:  FINDINGS: AC joint degenerative change. No fracture or dislocation. No other abnormalities.   IMPRESSION: AC joint degenerative changes.  PATIENT SURVEYS:  FOTO 57  COGNITION: Overall cognitive status: Within functional limits for tasks assessed     SENSATION: WFL  POSTURE: Rounded shoulders  UPPER EXTREMITY ROM: able to get full range but painful at end ranges with flexion and IR and pain halfway through motion for abduction and ER    UPPER EXTREMITY MMT: 5/5 except R shoulder abd 3+ with pain    SHOULDER SPECIAL TESTS: Impingement tests: Neer impingement test: negative, Hawkins/Kennedy impingement test: positive , and Painful arc test: positive   JOINT MOBILITY TESTING:  Full ROM but muscle tightness and pain  PALPATION:  Very tight in R upper trap   TODAY'S TREATMENT:                                                                                                                                         DATE:  RUE abd 134  ER 59  IR39 UBE L3 x3 mins each way  3lb Shoulder flex & abd 2x10 each. Some pain with abd  Green band ER/IR 2x10 Pec stretch 30s  RUE ER red abducted to 90 RUE PROM in all directions, RUE GH jt mobs grades 3  02/20/23 UBE L3 x3 mins each way  Pec stretch 30s  Seated rows and lat 35# 2x10  Chest press 20# 2x10 Shoulder ext 10# 2x10  Green band ER/IR 2x10 Diagonals with red band 2x10 Bent over row 4# 2x10 STW with TP release to post shld/trap/rhomboid  Theragun to above R UE GH jt mobs grade 3   02/18/23 UBE L4 x3 mins each way  Tricep exts 35# 2x10 Shoulder ext 10# 2x10 Cable rows 10# 2x10 Bicep curls 25# 2x10 Horizontal abd green 2x10 Green band SA lifts 2x10 RT UE ER  at 90/90 5# 2x10 OHP blue ball 2x10  Overhead extensions against wall with blue ball 2x10 PROM to R shoulder and neck  MET ER to R shoulder x3    02/13/23 UBE L3 x67mins  Blue band shoulder flexion into ER 2x10 IR/ER with cable 5#  2x10 Pec stretch in doorway 30s x2 3 way shoulder reaches green x10  3 way shoulder ext against wall 3# 2x10 Farmer's carry 20# 2 laps  Over head carry 10# 2 laps  PROM to R shoulder and neck  Passive neck stretches    02/11/23 UBE L3 x22mins each way Shoulder ext 10# 2x10 Cable rows 15# 2x10 Rows and lats 35# 2x10 Horizontal abd green 2x10  IYT 3# 2x10  DN to bilateral upper traps  Shoulder shrugs x10 Arm circles forwards and backwards x10 MH to upper traps   02/01/23 UBE L 2 2 min fwd/2 min backward Cable pulleys 15# rows 2 sets 10, shld ext 10# 2 sets 10 Upright row 20# 2 sets 10 PNF RT UE 3# 2 sets 10 Empty can RT UE 3# 2 sets10 RT UE ER at 90/90 5# 2 sets 10 - some pain Shld flex into abd and lower then reverse 10 each 3# STW with TP release to post shld/trap/rhomboid  Theragun to above R UE GH jt mobs grade 3 Educ on desk ergonomics and supporting RT UE when using keyboard and mouse to unload and support shld Ionto RT post shld 1.2 cc dex 4 mA 4 hour patch- educ on use ,safety and possible SE of ionto   01/29/23 UBE L2 x3 min  AAROM 3lb WaTE flex, Ext, IR  Rows & Ext blue 2x10 RUE ER/IR green 2x10 Triceps Ext 35lb 3x15 RUE PROM in all directions with end range holds  Rue GH jt mobs grade 3  01/25/23- EVAL   PATIENT EDUCATION: Education details: POC and HEP Person educated: Patient Education method: Explanation Education comprehension: verbalized understanding  HOME EXERCISE PROGRAM: Access Code: [email or web address] URL: [email or web address] Date: 01/25/2023 Prepared by: Cassie Freer  Exercises - Standing Shoulder Row with Anchored Resistance  - 1 x daily - 7 x weekly - 2 sets - 10 reps - Shoulder extension with  resistance - Neutral  - 1 x daily - 7 x weekly - 2 sets - 10 reps - Shoulder External Rotation and Scapular Retraction with Resistance  - 1 x daily - 7 x weekly - 2 sets - 10 reps - Standing Shoulder Horizontal Abduction with Resistance  - 1 x daily - 7 x weekly - 2 sets - 10 reps - Doorway Pec Stretch at 60 Degrees Abduction with Arm Straight  - 1 x daily - 7 x weekly - 30 hold - Doorway Rhomboid Stretch  - 1 x daily - 7 x weekly - 30 hold  ASSESSMENT:  CLINICAL IMPRESSION: Pt arrive with reports of slight improvement making some progression. RUE ROM limitation remains with abd, ER, and IR. Pain reported iday with active RUE abduction. Tactuel cue to RUE needed for UE positioning with ER/IR. Initial pain with PROM that eased up an MT progressed.  OBJECTIVE IMPAIRMENTS: decreased strength, impaired UE functional use, and pain.   ACTIVITY LIMITATIONS: reach over head and reaching arm back  REHAB POTENTIAL: Good  CLINICAL DECISION MAKING: Stable/uncomplicated  EVALUATION COMPLEXITY: Low  GOALS: Goals reviewed with patient? Yes  SHORT TERM GOALS: Target date: 03/01/23  Patient will be independent with initial HEP.  Goal status: 02/01/23 MET   LONG TERM GOALS: Target date: 04/05/23  Patient will be independent with advanced/ongoing HEP to improve outcomes and carryover.  Goal status: INITIAL  2.  Patient will report 75% improvement in R shoulder pain to improve QOL.  Baseline: 5/10 when it hurts Goal status: Progressing 20% 02/26/23  3.  Patient will be able to reach overhead and behind his back without pain  Baseline: unable to reach past horizontal plane  Goal status: INITIAL  4.  Patient will demonstrate full AROM of R shoulder without pain provocation to allow for increased ease of ADLs.  Baseline: see above Goal status: IN PROGRESS pain at end range for flexion and abd, pain with ER 02/18/23  5.  Patient will report 68 on FOTO (patient outcome measure)  to demonstrate  improved functional ability.  Baseline: 57 Goal status: INITIAL  PLAN:  PT FREQUENCY: 1-2x/week  PT DURATION: 10 weeks  PLANNED INTERVENTIONS: Therapeutic exercises, Therapeutic activity, Neuromuscular re-education, Balance training, Gait training, Patient/Family education, Self Care, Joint mobilization, Dry Needling, Electrical stimulation, Spinal manipulation, Spinal mobilization, Cryotherapy, Moist heat, Vasopneumatic device, Ionotophoresis 4mg /ml Dexamethasone, and Manual therapy  PLAN FOR NEXT SESSION: R shoulder mobility and strengthening   Grayce Sessions, PTA 02/26/2023, 8:44 AM

## 2023-02-27 ENCOUNTER — Encounter (HOSPITAL_COMMUNITY)
Admission: RE | Admit: 2023-02-27 | Discharge: 2023-02-27 | Disposition: A | Discharge status: Home / Self Care | Payer: BC Managed Care – PPO | Source: Ambulatory Visit | Attending: Gastroenterology | Admitting: Gastroenterology

## 2023-02-27 DIAGNOSIS — R110 Nausea: Secondary | ICD-10-CM | POA: Insufficient documentation

## 2023-02-27 DIAGNOSIS — K8050 Calculus of bile duct without cholangitis or cholecystitis without obstruction: Secondary | ICD-10-CM | POA: Diagnosis not present

## 2023-02-27 MED ORDER — TECHNETIUM TC 99M MEBROFENIN IV KIT
5.2300 | PACK | Freq: Once | INTRAVENOUS | Status: AC
  Administered 2023-02-27: 5.23 via INTRAVENOUS

## 2023-02-28 ENCOUNTER — Ambulatory Visit: Payer: BC Managed Care – PPO | Admitting: Physical Therapy

## 2023-02-28 ENCOUNTER — Encounter: Payer: Self-pay | Admitting: Physical Therapy

## 2023-02-28 DIAGNOSIS — M6281 Muscle weakness (generalized): Secondary | ICD-10-CM | POA: Diagnosis not present

## 2023-02-28 DIAGNOSIS — R293 Abnormal posture: Secondary | ICD-10-CM | POA: Diagnosis not present

## 2023-02-28 DIAGNOSIS — M25611 Stiffness of right shoulder, not elsewhere classified: Secondary | ICD-10-CM | POA: Diagnosis not present

## 2023-02-28 DIAGNOSIS — M25511 Pain in right shoulder: Secondary | ICD-10-CM | POA: Diagnosis not present

## 2023-02-28 NOTE — Therapy (Signed)
OUTPATIENT PHYSICAL THERAPY SHOULDER    Patient Name: Steven [name] MRN: [id number] DOB:1961/01/13, 62 y.o., male Today's Date: 02/28/2023  END OF SESSION:  PT End of Session - 02/28/23 0846     Visit Number 9    Date for PT Re-Evaluation 04/05/23    PT Start Time 0845    PT Stop Time 0930    PT Time Calculation (min) 45 min    Activity Tolerance Patient tolerated treatment well    Behavior During Therapy [facility] for tasks assessed/performed                 Past Medical History:  Diagnosis Date   AAA (abdominal aortic aneurysm) (HCC)    Allergy    At risk for sleep apnea    STOP-BANG= 4     SENT TO PCP 04-15-2014   BRBPR (bright red blood per rectum) 07/14/2015   ED (erectile dysfunction) 03/10/2013   Epididymal cyst    LEFT   GERD (gastroesophageal reflux disease)    Heart murmur mild mvp  -- asymptomatic   per pt echo 2005   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 Wanda Plump with URO for benign noduarity in left hemiscrotum (2008) felt to be variant of normal.   Left nephrolithiasis    Testicular pain 12/01/2010   Per Dr. Patsi Sears, thought to be due to epididymal cysts    Thoracic aortic aneurysm (HCC) 04/18/2017   VARICOCELE 02/17/2010   Qualifier: Diagnosis of  By: Etheleen Mayhew CMA Duncan Dull), [name]     Past Surgical History:  Procedure Laterality Date   COLONOSCOPY     COLONOSCOPY WITH PROPOFOL  01-06-2014   POLYPECTOMY   CYSTOSCOPY WITH RETROGRADE PYELOGRAM, URETEROSCOPY AND STENT PLACEMENT Left 04/19/2014   Procedure: CYSTOSCOPY WITH RETROGRADE PYELOGRAM with interpretation, URETEROSCOPY, stone extraction with basket, AND STENT PLACEMENT, implantation of backstop;  Surgeon: Kathi Ludwig, MD;  Location: [facility];  Service: Urology;  Laterality: Left;   EXCISION  SEBACEOUS CYST X3 OF BACK  03-13-2010   HOLMIUM LASER APPLICATION Left 04/19/2014   Procedure: HOLMIUM LASER APPLICATION;  Surgeon: Kathi Ludwig, MD;  Location: [facility];  Service: Urology;  Laterality: Left;   NASAL SEPTUM SURGERY  09-21-2002   POLYPECTOMY     REFRACTIVE SURGERY  1994   Patient Active Problem List   Diagnosis Date Noted   Nausea 10/17/2022   Shoulder pain 10/17/2022   Tinnitus 10/17/2022   Onychomycosis 09/27/2021   Voice hoarseness 03/15/2021   Aortic stenosis, mild 06/07/2020   Thoracic aortic aneurysm (HCC) 04/18/2017   Snoring 04/02/2017   Hyperglycemia 03/10/2013   ED (erectile dysfunction) 03/10/2013   Seborrheic keratoses 02/29/2012   INTERMITTENT VERTIGO 05/08/2010   VARICOCELE 02/17/2010   Hypogonadism in male 02/13/2010   HLD (hyperlipidemia) 02/13/2010   PLANTAR FASCIITIS 02/13/2010    PCP: Crawford Givens  REFERRING PROVIDER: Freddrick March   REFERRING DIAG:  M75.41 (ICD-10-CM) - Impingement syndrome of right shoulder    THERAPY DIAG:  Acute pain of right shoulder  Muscle weakness (generalized)  Stiffness of right shoulder, not elsewhere classified  Abnormal posture  Rationale for Evaluation and Treatment: Rehabilitation  ONSET DATE: 01/10/23  SUBJECTIVE:  SUBJECTIVE STATEMENT: Sore from last session.   Hand dominance: Right  PERTINENT HISTORY: N/A  PAIN:  Are you having pain? Yes: NPRS scale: 5/10 Pain location: posterior R shoulder Pain description: only hurts if I move it backwards Aggravating factors: reaching behind Relieving factors: nothing really   PRECAUTIONS: None  RED FLAGS: None   WEIGHT BEARING RESTRICTIONS: No  FALLS:  Has patient fallen in last 6 months? No  LIVING ENVIRONMENT: Lives with: lives with  their family and lives with their spouse Lives in: House/apartment  OCCUPATION: [employer] pilot, Passenger transport manager mostly   PLOF: Independent  PATIENT GOALS: no pain   NEXT MD VISIT:   OBJECTIVE:   DIAGNOSTIC FINDINGS:  FINDINGS: AC joint degenerative change. No fracture or dislocation. No other abnormalities.   IMPRESSION: AC joint degenerative changes.  PATIENT SURVEYS:  FOTO 57  COGNITION: Overall cognitive status: Within functional limits for tasks assessed     SENSATION: WFL  POSTURE: Rounded shoulders  UPPER EXTREMITY ROM: able to get full range but painful at end ranges with flexion and IR and pain halfway through motion for abduction and ER    UPPER EXTREMITY MMT: 5/5 except R shoulder abd 3+ with pain    SHOULDER SPECIAL TESTS: Impingement tests: Neer impingement test: negative, Hawkins/Kennedy impingement test: positive , and Painful arc test: positive   JOINT MOBILITY TESTING:  Full ROM but muscle tightness and pain  PALPATION:  Very tight in R upper trap   TODAY'S TREATMENT:                                                                                                                                         DATE:  02/28/23 UBE L2 x3 mins each way  Green band ER/IR 2x12 Seated rows and lat 35# 2x10  Chest press 20# 2x10 Pec stretch 4x10s  Shoulder abd 2lb 2x12  Shoulder flex 3lb 2x10 RUE PROM in all directions, RUE GH jt mobs grades 3  02/26/23 RUE abd 134  ER 59  IR39 UBE L3 x3 mins each way  3lb Shoulder flex & abd 2x10 each. Some pain with abd  Green band ER/IR 2x10 Pec stretch 30s  RUE ER red abducted to 90 RUE PROM in all directions, RUE GH jt mobs grades 3  02/20/23 UBE L3 x3 mins each way  Pec stretch 30s  Seated rows and lat 35# 2x10  Chest press 20# 2x10 Shoulder ext 10# 2x10  Green band ER/IR 2x10 Diagonals with red band 2x10 Bent over row 4# 2x10 STW with TP release to post shld/trap/rhomboid  Theragun to above R UE GH jt  mobs grade 3   02/18/23 UBE L4 x3 mins each way  Tricep exts 35# 2x10 Shoulder ext 10# 2x10 Cable rows 10# 2x10 Bicep curls 25# 2x10 Horizontal abd green 2x10 Green band SA lifts 2x10 RT UE ER at 90/90 5# 2x10 OHP blue  ball 2x10  Overhead extensions against wall with blue ball 2x10 PROM to R shoulder and neck  MET ER to R shoulder x3    02/13/23 UBE L3 x43mins  Blue band shoulder flexion into ER 2x10 IR/ER with cable 5# 2x10 Pec stretch in doorway 30s x2 3 way shoulder reaches green x10  3 way shoulder ext against wall 3# 2x10 Farmer's carry 20# 2 laps  Over head carry 10# 2 laps  PROM to R shoulder and neck  Passive neck stretches    02/11/23 UBE L3 x57mins each way Shoulder ext 10# 2x10 Cable rows 15# 2x10 Rows and lats 35# 2x10 Horizontal abd green 2x10  IYT 3# 2x10  DN to bilateral upper traps  Shoulder shrugs x10 Arm circles forwards and backwards x10 MH to upper traps   02/01/23 UBE L 2 2 min fwd/2 min backward Cable pulleys 15# rows 2 sets 10, shld ext 10# 2 sets 10 Upright row 20# 2 sets 10 PNF RT UE 3# 2 sets 10 Empty can RT UE 3# 2 sets10 RT UE ER at 90/90 5# 2 sets 10 - some pain Shld flex into abd and lower then reverse 10 each 3# STW with TP release to post shld/trap/rhomboid  Theragun to above R UE GH jt mobs grade 3 Educ on desk ergonomics and supporting RT UE when using keyboard and mouse to unload and support shld Ionto RT post shld 1.2 cc dex 4 mA 4 hour patch- educ on use ,safety and possible SE of ionto   01/29/23 UBE L2 x3 min  AAROM 3lb WaTE flex, Ext, IR  Rows & Ext blue 2x10 RUE ER/IR green 2x10 Triceps Ext 35lb 3x15 RUE PROM in all directions with end range holds  Rue GH jt mobs grade 3  01/25/23- EVAL   PATIENT EDUCATION: Education details: POC and HEP Person educated: Patient Education method: Explanation Education comprehension: verbalized understanding  HOME EXERCISE PROGRAM: Access Code: [email or web address] URL:  [email or web address] Date: 01/25/2023 Prepared by: Cassie Freer  Exercises - Standing Shoulder Row with Anchored Resistance  - 1 x daily - 7 x weekly - 2 sets - 10 reps - Shoulder extension with resistance - Neutral  - 1 x daily - 7 x weekly - 2 sets - 10 reps - Shoulder External Rotation and Scapular Retraction with Resistance  - 1 x daily - 7 x weekly - 2 sets - 10 reps - Standing Shoulder Horizontal Abduction with Resistance  - 1 x daily - 7 x weekly - 2 sets - 10 reps - Doorway Pec Stretch at 60 Degrees Abduction with Arm Straight  - 1 x daily - 7 x weekly - 30 hold - Doorway Rhomboid Stretch  - 1 x daily - 7 x weekly - 30 hold  ASSESSMENT:  CLINICAL IMPRESSION: Pt arrive with some soreness from previous session. Despite reports he did really well completing interventions. Pain again reported today with active RUE abduction. Tactuel cue to RUE needed for UE positioning with ER/IR. Initial pain with PROM that eased up an MT progressed. Some postural compensation notd with lat pull downs but reports the stretch felt good. Anterior R delt tightness with pec stretch.  OBJECTIVE IMPAIRMENTS: decreased strength, impaired UE functional use, and pain.   ACTIVITY LIMITATIONS: reach over head and reaching arm back  REHAB POTENTIAL: Good  CLINICAL DECISION MAKING: Stable/uncomplicated  EVALUATION COMPLEXITY: Low  GOALS: Goals reviewed with patient? Yes  SHORT TERM GOALS: Target date: 03/01/23  Patient will be independent  with initial HEP.  Goal status: 02/01/23 MET   LONG TERM GOALS: Target date: 04/05/23  Patient will be independent with advanced/ongoing HEP to improve outcomes and carryover.  Goal status: INITIAL  2.  Patient will report 75% improvement in R shoulder pain to improve QOL.  Baseline: 5/10 when it hurts Goal status: Progressing 20% 02/26/23  3.  Patient will be able to reach overhead and behind his back without pain  Baseline: unable to reach past  horizontal plane  Goal status: INITIAL  4.  Patient will demonstrate full AROM of R shoulder without pain provocation to allow for increased ease of ADLs.  Baseline: see above Goal status: IN PROGRESS pain at end range for flexion and abd, pain with ER 02/18/23  5.  Patient will report 78 on FOTO (patient outcome measure)  to demonstrate improved functional ability.  Baseline: 57 Goal status: INITIAL  PLAN:  PT FREQUENCY: 1-2x/week  PT DURATION: 10 weeks  PLANNED INTERVENTIONS: Therapeutic exercises, Therapeutic activity, Neuromuscular re-education, Balance training, Gait training, Patient/Family education, Self Care, Joint mobilization, Dry Needling, Electrical stimulation, Spinal manipulation, Spinal mobilization, Cryotherapy, Moist heat, Vasopneumatic device, Ionotophoresis 4mg /ml Dexamethasone, and Manual therapy  PLAN FOR NEXT SESSION: R shoulder mobility and strengthening   Grayce Sessions, PTA 02/28/2023, 8:46 AM

## 2023-03-18 DIAGNOSIS — M19011 Primary osteoarthritis, right shoulder: Secondary | ICD-10-CM | POA: Diagnosis not present

## 2023-03-18 DIAGNOSIS — M7541 Impingement syndrome of right shoulder: Secondary | ICD-10-CM | POA: Diagnosis not present

## 2023-03-31 DIAGNOSIS — M25511 Pain in right shoulder: Secondary | ICD-10-CM | POA: Diagnosis not present

## 2023-04-01 DIAGNOSIS — H52203 Unspecified astigmatism, bilateral: Secondary | ICD-10-CM | POA: Diagnosis not present

## 2023-04-01 DIAGNOSIS — H2513 Age-related nuclear cataract, bilateral: Secondary | ICD-10-CM | POA: Diagnosis not present

## 2023-04-08 DIAGNOSIS — M7501 Adhesive capsulitis of right shoulder: Secondary | ICD-10-CM | POA: Diagnosis not present

## 2023-04-08 DIAGNOSIS — M7541 Impingement syndrome of right shoulder: Secondary | ICD-10-CM | POA: Diagnosis not present

## 2023-04-11 IMAGING — CT CT ANGIO CHEST
2 of 6 series · 13 of 36 positions shown · IV contrast (iopamidol)
Comparison: CTA chest, 11/14/2020 and 03/03/2020.

CLINICAL DATA: Thoracic aortic aneurysm follow-up.

EXAM:
CT ANGIOGRAPHY CHEST WITH CONTRAST
TECHNIQUE: Multidetector CT imaging of the chest was performed using the
standard protocol during bolus administration of intravenous
contrast. Multiplanar CT image reconstructions and MIPs were
obtained to evaluate the vascular anatomy.
CONTRAST:  75mL [id number] IOPAMIDOL ([id number]-J23) INJECTION 76%

[Series 6: cta thorax 2.00 bv36 s3 axial arterial · axial · arterial · 0.69mm/px · z∈[+1446,+1772]mm · 12 of 193 slices shown]
[im 15/193  lung]
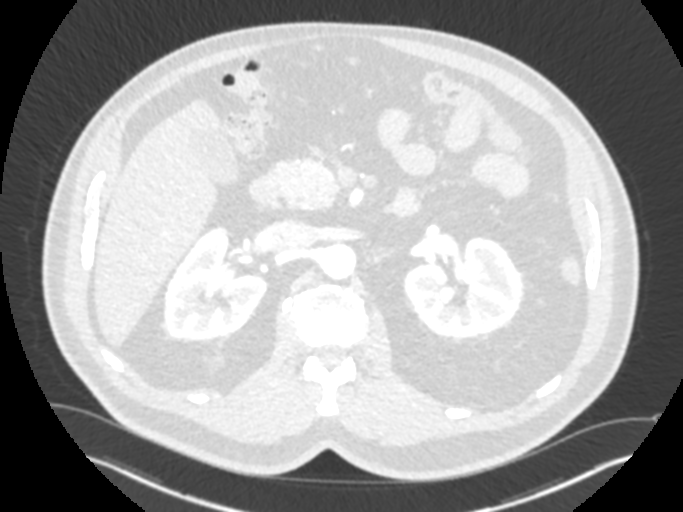
[im 30/193  mediastinal]
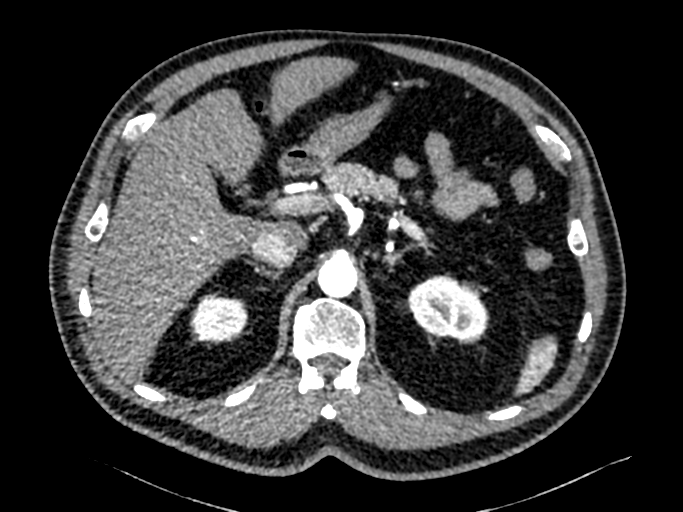
[im 45/193  lung]
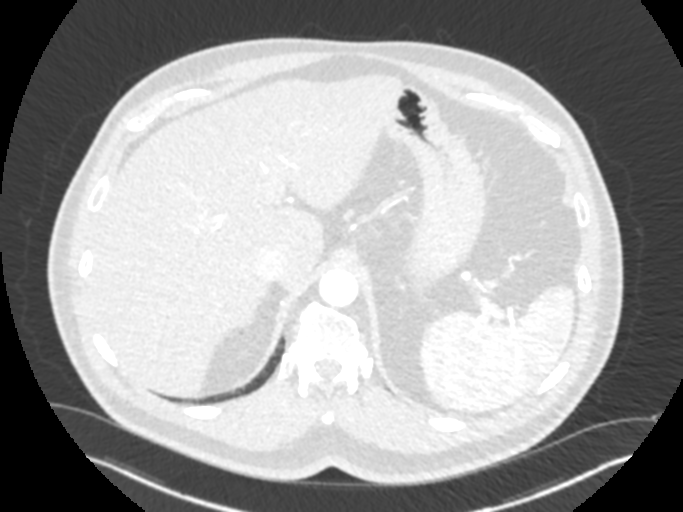
[im 60/193  mediastinal]
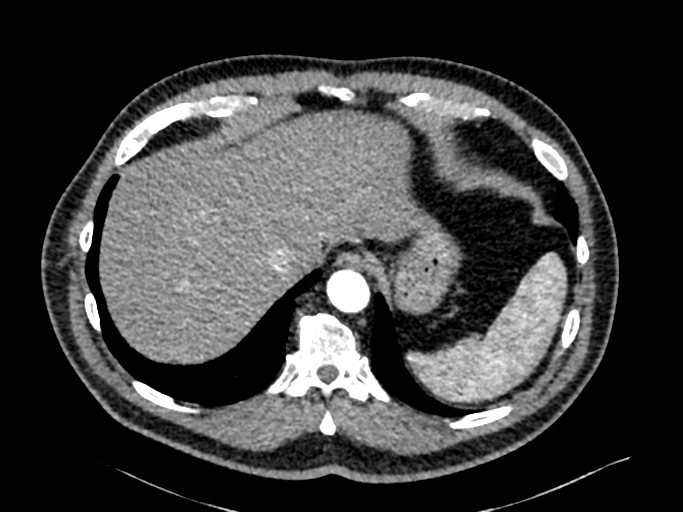
[im 74/193  lung]
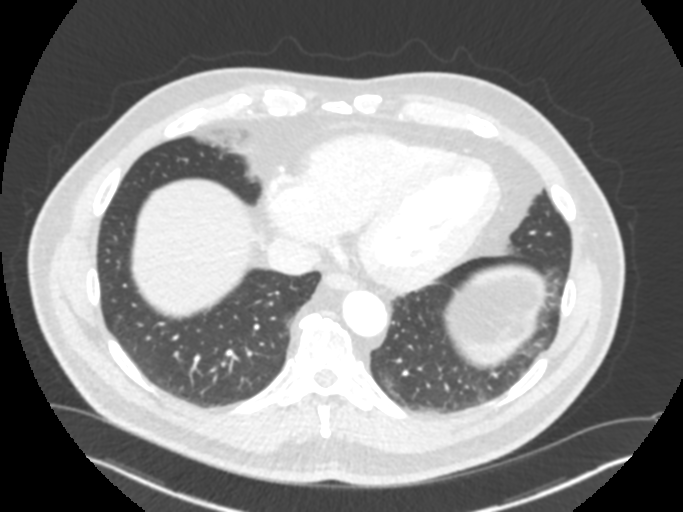
[im 89/193  mediastinal]
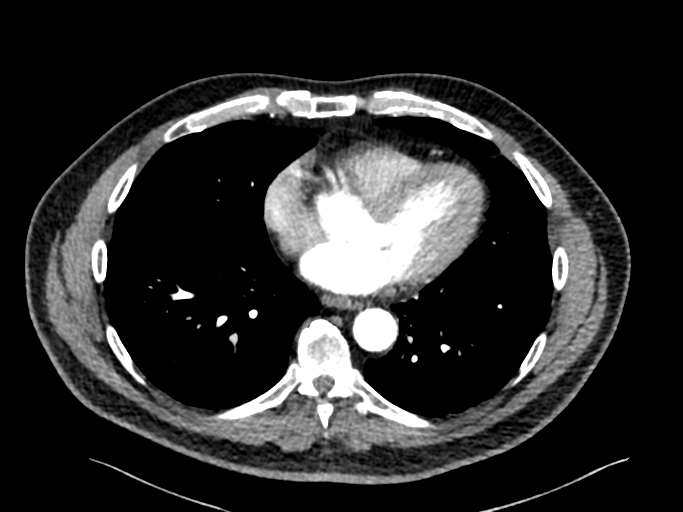
[im 104/193  lung]
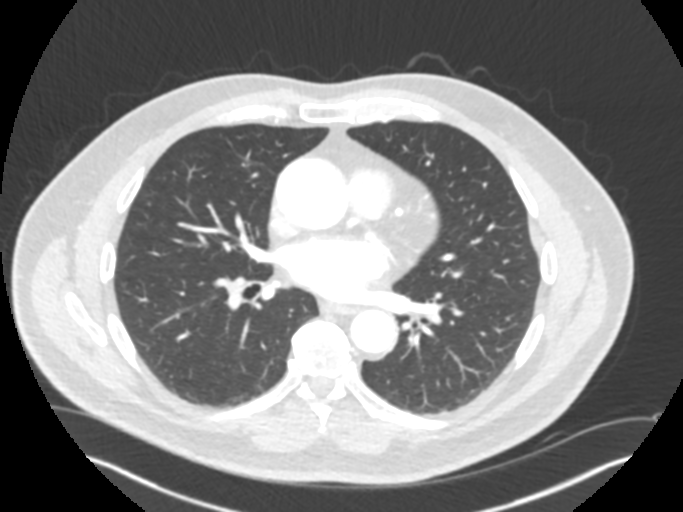
[im 119/193  mediastinal]
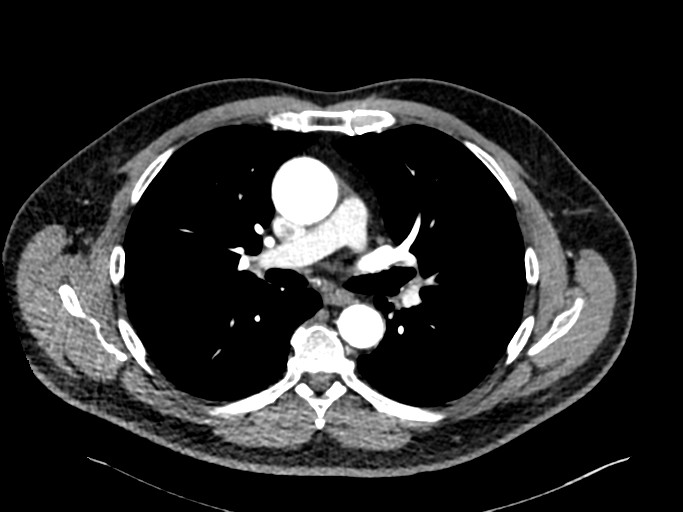
[im 133/193  lung]
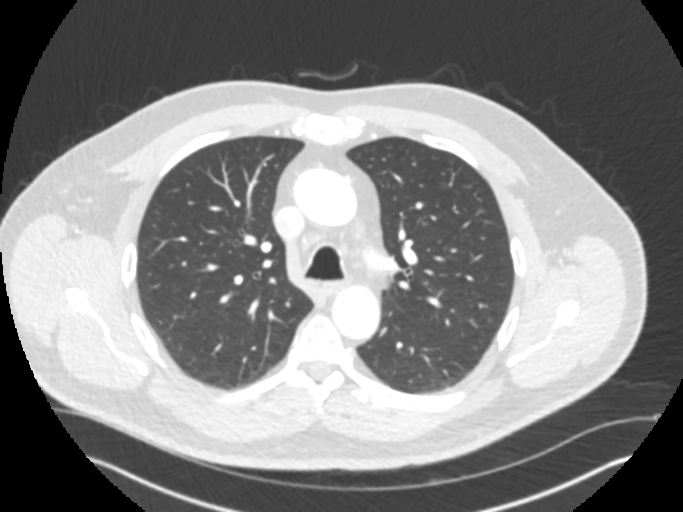
[im 148/193  mediastinal]
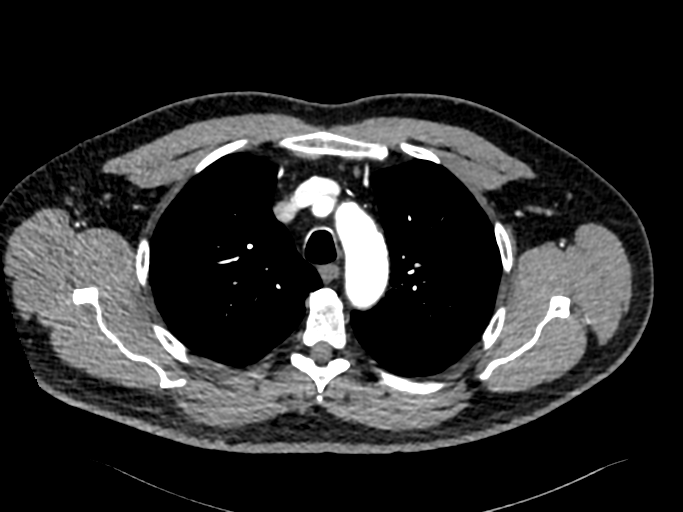
[im 163/193  lung]
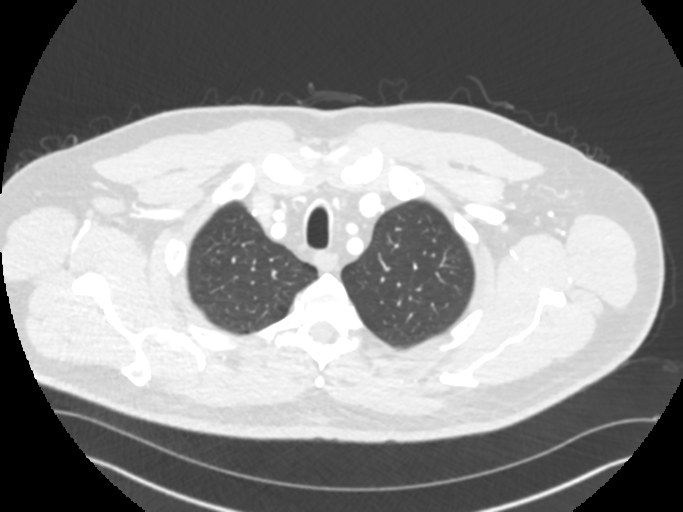
[im 178/193  mediastinal]
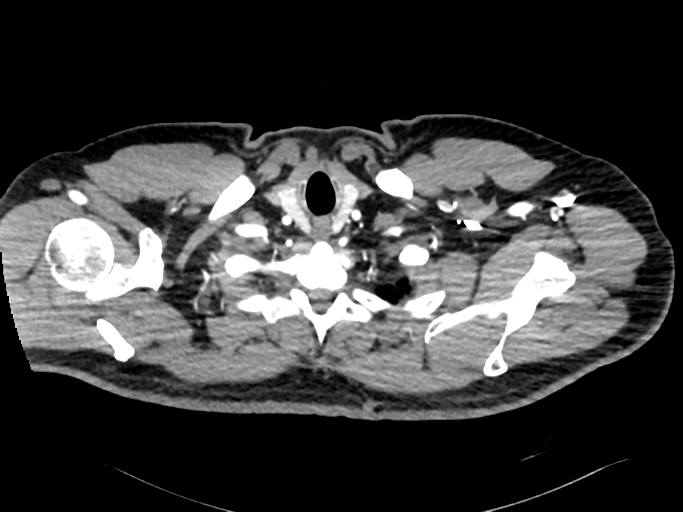

[Series 11: cta thorax 2.00 bv36 s3 cor st · coronal · 0.76mm/px · 1 of 177 slices shown]
[im 89/177  mediastinal]
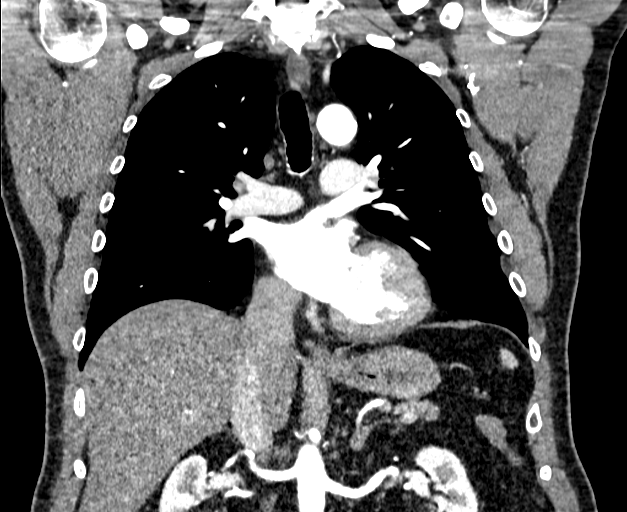

[13 of 36 positions shown; findings below may reference images not displayed]

FINDINGS: Cardiovascular:

Limitations by motion: Moderate

Aortic Root: Motion degraded, therefore difficult to measure.

Preferential opacification of the thoracic aorta. No evidence of
dissection. A conventional 3-vessel aortic LEFT arch is present.
Minimal atherosclerotic burden at the origins great vessels, with
greatest burden at the LEFT CCA origin. No hemodynamically
significant stenosis.

Thoracic Aorta:

--Ascending Aorta: 4.7 cm

--Aortic Arch: 3.2 cm

--Descending Aorta: 3.1 cm

Other:

Aortic valve calcifications, predominantly involving the
non-coronary cusp. Coronary atherosclerosis, with moderate burden
within the LAD.

Normal heart size. No pericardial effusion. Nondilated main PA. No
central or larger pulmonary embolus.

Mediastinum/Nodes: No enlarged mediastinal, hilar, or axillary lymph
nodes. Thyroid gland, trachea, and esophagus demonstrate no
significant findings.

Lungs/Pleura: Lungs are clear. No pleural effusion or pneumothorax.
Middle lobe sub-4 mm pulmonary nodules are unchanged from
comparisons. See key image.

Upper Abdomen: No acute abnormality. Perihilar accessory spleen.
Punctate (<3 mm) LEFT nonobstructing nephrolith.

Musculoskeletal: No chest wall abnormality. No acute osseous
findings.

Review of the MIP images confirms the above findings.
IMPRESSION: 1. Ectatic ascending thoracic aorta, measuring up to 4.7 cm and
similar to most recent (11/14/2020) comparison.
Continue semi-annual imaging followup by CTA or MRA and referral to
cardiothoracic surgery if not already obtained. This recommendation
follows 1070 ACCF/AHA/AATS/ACR/ASA/SCA/SANGAIAH/JOHNSEN/JIM/NOMASIBULELE Guidelines
for the Diagnosis and Management of Patients With Thoracic Aortic
Disease. Circulation. 1070; 121: E266-e369. Aortic aneurysm NOS
([id number])
2. Aortic valve calcifications, predominantly involving the
non-coronary cusp. Correlate with echocardiogram, unless previously
performed.
3. Coronary atherosclerosis.
4. Rub-A mm middle lobe pulmonary nodules. These appear stable since
[DATE] comparison, therefore considered benign.
5. Punctate, nonobstructing LEFT nephrolithiasis.

## 2023-04-12 ENCOUNTER — Other Ambulatory Visit: Payer: Self-pay | Admitting: Gastroenterology

## 2023-04-16 ENCOUNTER — Other Ambulatory Visit (HOSPITAL_COMMUNITY)
Admission: RE | Admit: 2023-04-16 | Discharge: 2023-04-16 | Disposition: A | Discharge status: Home / Self Care | Payer: BC Managed Care – PPO | Source: Ambulatory Visit | Attending: Oncology | Admitting: Oncology

## 2023-04-16 DIAGNOSIS — Z006 Encounter for examination for normal comparison and control in clinical research program: Secondary | ICD-10-CM | POA: Insufficient documentation

## 2023-04-23 DIAGNOSIS — M7501 Adhesive capsulitis of right shoulder: Secondary | ICD-10-CM | POA: Diagnosis not present

## 2023-04-23 DIAGNOSIS — S43431A Superior glenoid labrum lesion of right shoulder, initial encounter: Secondary | ICD-10-CM | POA: Diagnosis not present

## 2023-04-23 LAB — GENECONNECT MOLECULAR SCREEN: Genetic Analysis Overall Interpretation: NEGATIVE

## 2023-04-25 ENCOUNTER — Ambulatory Visit: Payer: BC Managed Care – PPO | Admitting: Gastroenterology

## 2023-05-10 ENCOUNTER — Other Ambulatory Visit: Payer: Self-pay | Admitting: Family Medicine

## 2023-05-10 NOTE — Telephone Encounter (Signed)
LAST APPOINTMENT DATE: 10/16/22    NEXT APPOINTMENT DATE: Visit date not found    LAST REFILL: 10/12/22  QTY: 150 rf 5

## 2023-05-12 NOTE — Telephone Encounter (Signed)
Sent. Thanks.   

## 2023-05-30 ENCOUNTER — Ambulatory Visit: Payer: BC Managed Care – PPO | Admitting: Gastroenterology

## 2023-05-30 ENCOUNTER — Encounter: Payer: Self-pay | Admitting: Gastroenterology

## 2023-05-30 VITALS — BP 126/72 | HR 70 | Ht 74.0 in | Wt 236.0 lb

## 2023-05-30 DIAGNOSIS — R110 Nausea: Secondary | ICD-10-CM

## 2023-05-30 DIAGNOSIS — Z860100 Personal history of colon polyps, unspecified: Secondary | ICD-10-CM

## 2023-05-30 DIAGNOSIS — E785 Hyperlipidemia, unspecified: Secondary | ICD-10-CM | POA: Diagnosis not present

## 2023-05-30 DIAGNOSIS — I719 Aortic aneurysm of unspecified site, without rupture: Secondary | ICD-10-CM | POA: Diagnosis not present

## 2023-05-30 NOTE — Progress Notes (Signed)
Chief Complaint: Chronic Nausea Primary GI MD: Dr. Barron Alvine  HPI: 62 year old male with medical history as listed below presents for evaluation of chronic nausea.  Patient has been seen July by Doug Sou, PA-C.  Patient is had extensive evaluation for nausea that has been largely unrevealing.  Had CT head that was negative for intracranial pathology.  Underwent EGD 7/24 with a single erosion in the prepyloric stomach, but otherwise normal and pathology was benign.  He has not had a response to high-dose PPI.  CT angio done 7/22 showed patent intra-abdominal vasculature.  Liver enlarged, but otherwise no duct dilation or other concerning features.  Gallbladder and pancreas are normal.  Lab work has been normal besides slightly elevated AST.  Overall normal CBC, CMP, TSH, fecal elastase, and fecal fat.  Dr. Barron Alvine recommended HIDA scan as all other GI etiologies have been ruled out.  HIDA scan showed normal gallbladder ejection fraction of 60% and patent cystic and common bile ducts.  Discussed the use of AI scribe software for clinical note transcription with the patient, who gave verbal consent to proceed.  The patient, with a history of hyperlipidemia and hypertension, presents with persistent nausea. He reports that the nausea typically begins 20-30 minutes before mealtime and can be alleviated by eating small amounts of food. Overeating, however, exacerbates the nausea. The patient has found relief from peppermint. He has lost approximately 16 pounds due to decreased food intake with smaller portions. Overall, he feels his nausea has improved.  In an attempt to identify the cause of the nausea, the patient discontinued all medications, except for metoprolol, and noticed an improvement in symptoms. He suspects the nausea may be a side effect of one of his medications, but is unsure which one. He has recently resumed Nexlizet every three days and discontinued pantoprazole last week.  He has Zofran on hand but has not felt the need to take it.  The patient also reports that one of his medications causes shoulder pain, but he has not yet identified which one. He has a history of aortic aneurysm and is under the care of a cardiothoracic surgeon. He has stopped taking atorvastatin and is aware that his cholesterol levels need to be monitored. He has appointments scheduled with a cardiologist and his primary care physician.      PREVIOUS GI WORKUP   EGD 01/16/2023 for chronic nausea - Normal esophagus.  - A single, small, non- bleeding gastric erosion with no bleeding and no stigmata of recent bleeding was noted in the pre- pyloric stomach. Biopsied. - The remainder of the stomach was otherwise normal appearing on anterograde and retroflexed views. The pylorus was patent and easily traversed. The stomach was biopsied.  - Normal examined duodenum.  Colonoscopy 02/2022 - Three 2 to 5 mm polyps in the ascending colon, removed with a cold snare. Resected and retrieved.  - One 3 mm polyp in the sigmoid colon, removed with a cold snare. Resected and retrieved.  - Six 1 to 3 mm polyps in the rectum and at the recto- sigmoid colon, removed with a cold snare. Resected and retrieved.  - Submucosal nodule in the rectum. Biopsied/ resected with cold forceps.  - Diverticulosis in the sigmoid colon, in the descending colon and in the ascending colon.  - Non- bleeding internal hemorrhoids. - Recall 02/2027   Past Medical History:  Diagnosis Date   AAA (abdominal aortic aneurysm) (HCC)    Allergy    At risk for sleep apnea  STOP-BANG= 4     SENT TO PCP 04-15-2014   BRBPR (bright red blood per rectum) 07/14/2015   ED (erectile dysfunction) 03/10/2013   Epididymal cyst    LEFT   Frozen shoulder    Seeing emerge othro was given a shot 04-2023   GERD (gastroesophageal reflux disease)    Heart murmur mild mvp  -- asymptomatic   per pt echo 2005   History of positive PPD    1997---   S/P  INH  Tx  for 6 months   Hx of adenomatous colonic polyps 04/01/2017   Hyperglycemia 03/10/2013   Hyperlipidemia    Hypogonadism male    Previously evaluated by Dr. Wanda Plump with URO for benign noduarity in left hemiscrotum (2008) felt to be variant of normal.   Left nephrolithiasis    Testicular pain 12/01/2010   Per Dr. Patsi Sears, thought to be due to epididymal cysts    Thoracic aortic aneurysm (HCC) 04/18/2017   VARICOCELE 02/17/2010   Qualifier: Diagnosis of  By: Etheleen Mayhew CMA Duncan Dull), [name]      Past Surgical History:  Procedure Laterality Date   COLONOSCOPY     COLONOSCOPY WITH PROPOFOL  01-06-2014   POLYPECTOMY   CYSTOSCOPY WITH RETROGRADE PYELOGRAM, URETEROSCOPY AND STENT PLACEMENT Left 04/19/2014   Procedure: CYSTOSCOPY WITH RETROGRADE PYELOGRAM with interpretation, URETEROSCOPY, stone extraction with basket, AND STENT PLACEMENT, implantation of backstop;  Surgeon: Kathi Ludwig, MD;  Location: [facility];  Service: Urology;  Laterality: Left;   EXCISION SEBACEOUS CYST X3 OF BACK  03-13-2010   HOLMIUM LASER APPLICATION Left 04/19/2014   Procedure: HOLMIUM LASER APPLICATION;  Surgeon: Kathi Ludwig, MD;  Location: [facility];  Service: Urology;  Laterality: Left;   NASAL SEPTUM SURGERY  09-21-2002   POLYPECTOMY     REFRACTIVE SURGERY  1994    Current Outpatient Medications  Medication Sig Dispense Refill   aspirin 81 MG tablet Take 81 mg by mouth daily.     Bempedoic Acid-Ezetimibe (NEXLIZET) 180-10 MG TABS Take 1 tablet by mouth daily. (Patient taking differently: Take 1 tablet by mouth daily as needed.) 90 tablet 2   metoprolol succinate (TOPROL-XL) 25 MG 24 hr tablet Take 1 tablet (25 mg total) by mouth daily. 90 tablet 3   acetaminophen (TYLENOL) 500 MG tablet Take 500 mg by mouth every 6 (six) hours as needed. (Patient not taking: Reported on 05/30/2023)     atorvastatin (LIPITOR) 20 MG tablet Take 0.5 tablets (10 mg  total) by mouth daily. (Patient not taking: Reported on 01/22/2023)     ondansetron (ZOFRAN-ODT) 4 MG disintegrating tablet Take 1 tablet (4 mg total) by mouth every 8 (eight) hours as needed for nausea or vomiting. (Patient not taking: Reported on 05/30/2023) 20 tablet 0   pantoprazole (PROTONIX) 40 MG tablet TAKE 1 TABLET BY MOUTH TWICE A DAY (Patient not taking: Reported on 05/30/2023) 180 tablet 0   sildenafil (REVATIO) 20 MG tablet TAKE 3 TO 5 TABLETS BY MOUTH ONCE DAILY AS NEEDED (Patient not taking: Reported on 05/30/2023) 150 tablet 5   No current facility-administered medications for this visit.    Allergies as of 05/30/2023 - Review Complete 05/30/2023  Allergen Reaction Noted   Crestor [rosuvastatin calcium] Other (See Comments) 11/22/2017   Naproxen sodium Swelling 09/03/2011   Penicillins Swelling    Repatha [evolocumab]  09/25/2021   Erythromycin Rash     Family History  Problem Relation Age of Onset   Cancer Mother  Renal CA, treated 1993   Parkinsonism Father    Alzheimer's disease Father    Diabetes Brother    Diabetes Other    Kidney disease Other    Colon cancer Neg Hx    Prostate cancer Neg Hx    Esophageal cancer Neg Hx    Rectal cancer Neg Hx    Stomach cancer Neg Hx    Pancreatic cancer Neg Hx    Colon polyps Neg Hx     Social History   Socioeconomic History   Marital status: Married    Spouse name: Not on file   Number of children: 3   Years of education: Not on file   Highest education level: Master's degree (e.g., MA, MS, MEng, MEd, MSW, MBA)  Occupational History   Occupation: IT trainer, 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    Comment: Masters, [employer], [employer], [employer]  Tobacco Use   Smoking status: Former    Current packs/day: 0.00    Average packs/day: 1.5 packs/day for 34.0 years (51.0 ttl pk-yrs)    Types: Cigarettes    Start date: 10/30/1974    Quit date: 10/29/2008    Years since quitting: 14.5   Smokeless tobacco: Never   Vaping Use   Vaping status: Never Used  Substance and Sexual Activity   Alcohol use: Not Currently    Comment: Rare- couple times per year   Drug use: Never   Sexual activity: Yes  Other Topics Concern   Not on file  Social History Narrative   Married since 1988, second marriage   3 children   1 daughter alive -POTS - monitors salt intake.     2 step-daughters (one diagnosed with Crohn's Disease.)   Regular exercise:  Yes   Social Determinants of Health   Financial Resource Strain: Low Risk  (10/12/2022)   Overall Financial Resource Strain (CARDIA)    Difficulty of Paying Living Expenses: Not hard at all  Food Insecurity: No Food Insecurity (10/12/2022)   Hunger Vital Sign    Worried About Running Out of Food in the Last Year: Never true    Ran Out of Food in the Last Year: Never true  Transportation Needs: No Transportation Needs (10/12/2022)   PRAPARE - Administrator, Civil Service (Medical): No    Lack of Transportation (Non-Medical): No  Physical Activity: Insufficiently Active (10/12/2022)   Exercise Vital Sign    Days of Exercise per Week: 2 days    Minutes of Exercise per Session: 20 min  Stress: No Stress Concern Present (10/12/2022)   [employer] of Occupational Health - Occupational Stress Questionnaire    Feeling of Stress : Only a little  Social Connections: Socially Integrated (10/12/2022)   Social Connection and Isolation Panel [NHANES]    Frequency of Communication with Friends and Family: Three times a week    Frequency of Social Gatherings with Friends and Family: Never    Attends Religious Services: 1 to 4 times per year    Active Member of [employer] or Organizations: Yes    Attends Engineer, structural: More than 4 times per year    Marital Status: Married  Catering manager Violence: Not on file    Review of Systems:    Constitutional: No weight loss, fever, chills, weakness or fatigue HEENT: Eyes: No change in vision                Ears, Nose, Throat:  No change in hearing or  congestion Skin: No rash or itching Cardiovascular: No chest pain, chest pressure or palpitations   Respiratory: No SOB or cough Gastrointestinal: See HPI and otherwise negative Genitourinary: No dysuria or change in urinary frequency Neurological: No headache, dizziness or syncope Musculoskeletal: No new muscle or joint pain Hematologic: No bleeding or bruising Psychiatric: No history of depression or anxiety    Physical Exam:  Vital signs: BP 126/72   Pulse 70   Ht 6\' 2"  (1.88 m)   Wt 236 lb (107 kg)   BMI 30.30 kg/m   Constitutional: NAD, Well developed, Well nourished, alert and cooperative Head:  Normocephalic and atraumatic. Eyes:   PEERL, EOMI. No icterus. Conjunctiva pink. Respiratory: Respirations even and unlabored. Lungs clear to auscultation bilaterally.   No wheezes, crackles, or rhonchi.  Cardiovascular:  Regular rate and rhythm. No peripheral edema, cyanosis or pallor.  Rectal:  Not performed.  Msk:  Symmetrical without gross deformities. Without edema, no deformity or joint abnormality.  Neurologic:  Alert and  oriented x4;  grossly normal neurologically.  Skin:   Dry and intact without significant lesions or rashes. Psychiatric: Oriented to person, place and time. Demonstrates good judgement and reason without abnormal affect or behaviors.   RELEVANT LABS AND IMAGING: CBC    Component Value Date/Time   WBC 9.9 01/14/2023 1519   RBC 4.17 (L) 01/14/2023 1519   HGB 12.6 (L) 01/14/2023 1548   HCT 37.0 (L) 01/14/2023 1548   PLT 208 01/14/2023 1519   MCV 96.6 01/14/2023 1519   MCH 32.1 01/14/2023 1519   MCHC 33.3 01/14/2023 1519   RDW 13.2 01/14/2023 1519   LYMPHSABS 3.2 10/16/2022 1250   MONOABS 0.6 10/16/2022 1250   EOSABS 0.4 10/16/2022 1250   BASOSABS 0.1 10/16/2022 1250    CMP     Component Value Date/Time   NA 140 01/14/2023 1548   NA 143 03/07/2022 0741   K 4.8 01/14/2023 1548   CL 106  01/14/2023 1548   CO2 23 01/14/2023 1519   GLUCOSE 138 (H) 01/14/2023 1548   BUN 42 (H) 01/14/2023 1548   BUN 12 03/07/2022 0741   CREATININE 0.90 01/14/2023 1548   CALCIUM 9.6 01/14/2023 1519   PROT 7.4 01/14/2023 1519   PROT 6.9 08/24/2022 0738   ALBUMIN 4.2 01/14/2023 1519   ALBUMIN 4.5 08/24/2022 0738   AST 61 (H) 01/14/2023 1519   ALT 41 01/14/2023 1519   ALKPHOS 82 01/14/2023 1519   BILITOT 0.9 01/14/2023 1519   BILITOT 0.7 08/24/2022 0738   GFRNONAA >60 01/14/2023 1519   GFRAA 108 02/05/2020 1135     Assessment/Plan:      Chronic Nausea Extensive negative workup including CT head, CTA abdomen, EGD, PPI, HIDA scan.  Improved after discontinuing all medications except Metoprolol. Suspected medication side effect. Nausea occurs before meals and is relieved by small meals and peppermint. Question possible gastroparesis component as well as improved with small meals. No alarming GI symptoms. Extensive GI workup negative. --Continue current management with small meals and peppermint. --Consider scheduled Zofran or Amitriptyline if nausea worsens or becomes intolerable.  Hyperlipidemia Discontinued Atorvastatin and Nexlizet due to suspected side effects. Plans to slowly reintroduce Nexlizet. -Check cholesterol levels at next appointment with primary care physician.  Aortic Aneurysm Continues to take Metoprolol. -Continue Metoprolol as advised by cardiothoracic surgeon.  Colonoscopy Last performed in 2023, next due in 2028. -Continue current follow-up plan.         Boone Master, PA-C [facility] 05/30/2023, 11:20 AM  Cc: Joaquim Nam, MD

## 2023-06-03 NOTE — Progress Notes (Signed)
Agree with the assessment and plan as outlined by Bailey McMichael, PA-C.  Dawanda Mapel, DO, FACG  

## 2023-06-04 ENCOUNTER — Other Ambulatory Visit: Payer: Self-pay | Admitting: Thoracic Surgery (Cardiothoracic Vascular Surgery)

## 2023-06-04 DIAGNOSIS — I7121 Aneurysm of the ascending aorta, without rupture: Secondary | ICD-10-CM

## 2023-06-12 ENCOUNTER — Encounter: Payer: Self-pay | Admitting: Thoracic Surgery (Cardiothoracic Vascular Surgery)

## 2023-07-02 ENCOUNTER — Ambulatory Visit
Admission: RE | Admit: 2023-07-02 | Discharge: 2023-07-02 | Disposition: A | Discharge status: Home / Self Care | Payer: BC Managed Care – PPO | Source: Ambulatory Visit | Attending: Thoracic Surgery (Cardiothoracic Vascular Surgery) | Admitting: Thoracic Surgery (Cardiothoracic Vascular Surgery)

## 2023-07-02 DIAGNOSIS — I7121 Aneurysm of the ascending aorta, without rupture: Secondary | ICD-10-CM | POA: Diagnosis not present

## 2023-07-02 MED ORDER — IOPAMIDOL (ISOVUE-370) INJECTION 76%
500.0000 mL | Freq: Once | INTRAVENOUS | Status: AC | PRN
  Administered 2023-07-02: 75 mL via INTRAVENOUS

## 2023-07-09 ENCOUNTER — Encounter: Payer: Self-pay | Admitting: Thoracic Surgery (Cardiothoracic Vascular Surgery)

## 2023-07-09 ENCOUNTER — Ambulatory Visit (INDEPENDENT_AMBULATORY_CARE_PROVIDER_SITE_OTHER): Payer: BC Managed Care – PPO | Admitting: Thoracic Surgery (Cardiothoracic Vascular Surgery)

## 2023-07-09 VITALS — BP 122/71 | HR 75 | Resp 20 | Ht 74.0 in | Wt 237.0 lb

## 2023-07-09 DIAGNOSIS — I7121 Aneurysm of the ascending aorta, without rupture: Secondary | ICD-10-CM | POA: Diagnosis not present

## 2023-07-09 NOTE — Progress Notes (Signed)
 [address]       [name] [address]             [phone]     HPI: [name] returns for follow-up of his ascending aneurysm.  Steven [name] is a 63 year old man with a history of tobacco use, thoracic aortic atherosclerosis, coronary atherosclerosis, ascending thoracic aneurysm, heart murmur, mild aortic stenosis, mild to moderate aortic insufficiency, hyperlipidemia, nephrolithiasis, positive PPD, and chronic nausea.  First noted to have an aneurysm in 2018 on a CT for lung cancer screening.  I last saw him in July 2024.  Aneurysm was stable at about 4.7 cm.  In the interim since his last visit he continues to have issues with nausea.  He had an extensive GI workup at as well as head CTs and abdominal CTs.  He has tried various combinations of medicines but his nausea persists.  No chest pain, pressure, tightness, or shortness of breath.  Past Medical History:  Diagnosis Date   AAA (abdominal aortic aneurysm) (HCC)    Allergy    At risk for sleep apnea    STOP-BANG= 4     SENT TO PCP 04-15-2014   BRBPR (bright red blood per rectum) 07/14/2015   ED (erectile dysfunction) 03/10/2013   Epididymal cyst    LEFT   Frozen shoulder    Seeing emerge othro was given a shot 04-2023   GERD (gastroesophageal reflux disease)    Heart murmur mild mvp  -- asymptomatic   per pt echo 2005   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 Amiel with URO for benign noduarity in left hemiscrotum (2008) felt to be variant of normal.   Left nephrolithiasis    Testicular pain 12/01/2010   Per Dr. Chales, thought to be due to epididymal cysts    Thoracic aortic aneurysm (HCC) 04/18/2017   VARICOCELE 02/17/2010   Qualifier: Diagnosis of  By: Dee CMA (AAMA), [name]      Current Outpatient  Medications  Medication Sig Dispense Refill   acetaminophen  (TYLENOL ) 500 MG tablet Take 500 mg by mouth every 6 (six) hours as needed.     aspirin 81 MG tablet Take 81 mg by mouth daily.     atorvastatin  (LIPITOR) 20 MG tablet Take 0.5 tablets (10 mg total) by mouth daily.     Bempedoic Acid-Ezetimibe  (NEXLIZET ) 180-10 MG TABS Take 1 tablet by mouth daily. (Patient taking differently: Take 1 tablet by mouth daily as needed.) 90 tablet 2   metoprolol  succinate (TOPROL -XL) 25 MG 24 hr tablet Take 1 tablet (25 mg total) by mouth daily. 90 tablet 3   ondansetron  (ZOFRAN -ODT) 4 MG disintegrating tablet Take 1 tablet (4 mg total) by mouth every 8 (eight) hours as needed for nausea or vomiting. 20 tablet 0   pantoprazole  (PROTONIX ) 40 MG tablet TAKE 1 TABLET BY MOUTH TWICE A DAY 180 tablet 0   sildenafil  (REVATIO ) 20 MG tablet TAKE 3 TO 5 TABLETS BY MOUTH ONCE DAILY AS NEEDED 150 tablet 5   No current facility-administered medications for this visit.    Physical Exam BP 122/71 (BP Location: Left Arm, Patient Position: Sitting, Cuff Size: Large)   Pulse 75   Resp 20   Ht 6' 2 (1.88 m)  Wt 237 lb (107.5 kg)   SpO2 95% Comment: RA  BMI 30.43 kg/m  Well-appearing 63 year old man in no acute distress Alert and oriented x 3 with no focal deficits Lungs clear bilaterally Cardiac regular rate and rhythm with a 2/6 systolic murmur No carotid bruit No peripheral edema  Diagnostic Tests: CT ANGIOGRAPHY CHEST WITH CONTRAST   TECHNIQUE: Multidetector CT imaging of the chest was performed using the standard protocol during bolus administration of intravenous contrast. Multiplanar CT image reconstructions and MIPs were obtained to evaluate the vascular anatomy.   RADIATION DOSE REDUCTION: This exam was performed according to the departmental dose-optimization program which includes automated exposure control, adjustment of the mA and/or kV according to patient size and/or use of iterative  reconstruction technique.   CONTRAST:  75mL ISOVUE -370 IOPAMIDOL  (ISOVUE -370) INJECTION 76%   COMPARISON:  January 14, 2023.   FINDINGS: Cardiovascular: Grossly stable 4.7 cm ascending thoracic aortic aneurysm. No dissection is noted. Normal cardiac size. No pericardial effusion. Mild coronary artery calcifications are noted.   Mediastinum/Nodes: No enlarged mediastinal, hilar, or axillary lymph nodes. Thyroid gland, trachea, and esophagus demonstrate no significant findings.   Lungs/Pleura: Lungs are clear. No pleural effusion or pneumothorax.   Upper Abdomen: No acute abnormality.   Musculoskeletal: No chest wall abnormality. No acute or significant osseous findings.   Review of the MIP images confirms the above findings.   IMPRESSION: Grossly stable 4.7 cm Ascending thoracic aortic aneurysm. Recommend semi-annual imaging followup by CTA or MRA and referral to cardiothoracic surgery if not already obtained. This recommendation follows 2010 ACCF/AHA/AATS/ACR/ASA/SCA/SCAI/SIR/STS/SVM Guidelines for the Diagnosis and Management of Patients With Thoracic Aortic Disease. Circulation. 2010; 121: [id number]. Aortic aneurysm NOS (ICD10-I71.9).     Electronically Signed   By: Lynwood Landy Raddle M.D.   On: 07/02/2023 15:00 I personally reviewed the CT images.  Stable 4.7 to 4.8 cm ascending aneurysm.  Aortic and coronary atherosclerosis.  No suspicious lung nodules.  Impression: Steven [name] is a 63 year old man with a history of tobacco use, thoracic aortic atherosclerosis, coronary atherosclerosis, ascending thoracic aneurysm, heart murmur, mild aortic stenosis, mild to moderate aortic insufficiency, hyperlipidemia, nephrolithiasis, positive PPD, and chronic nausea.  Ascending aneurysm, thoracic aortic atherosclerosis-stable around 4.7 to 4.8 cm.  Continued semiannual follow-up.  Hypertension-blood pressure within normal range on current regimen.  Persistent nausea-still  undetermined source.  Mild AS, mild to moderate AI-last echo about a year ago.  Has an appointment with Dr. Wendel February.  Plan: Return in 6 months with CT angiogram of chest   Elspeth JAYSON Millers, MD [facility] [phone]

## 2023-07-26 NOTE — Progress Notes (Signed)
 Cardiology Office Note:   Date:  08/01/2023  ID:  Steven [name], DOB 1961/04/03, MRN [id number] PCP:  Cleatus Arlyss RAMAN, MD  [facility] Providers Cardiologist:  Wendel Haws, MD Referring MD: Cleatus Arlyss RAMAN, MD  Chief Complaint/Reason for Referral: Cardiology follow-up ASSESSMENT:    1. Coronary artery disease involving native coronary artery of native heart without angina pectoris   2. Aneurysm of ascending aorta without rupture (HCC)   3. Hyperlipidemia LDL goal <70   4. Aortic atherosclerosis (HCC)   5. BMI 32.0-32.9,adult     PLAN:   In order of problems listed above: Coronary artery disease: Mild on prior coronary CTA.  Continue aspirin and Lipitor. Ascending aortic aneurysm: This is being followed by Dr. Kerrin with CT scans every 6 months. Hyperlipidemia: Check lipid panel, LP(a) today. Aortic atherosclerosis: Continue aspirin, Nexlizet , and strict blood pressure control. Elevated BMI: Diet and exercise modification; hemoglobin A1c in April 2024 was 6.            Dispo:  Return in about 1 year (around 07/31/2024).      Medication Adjustments/Labs and Tests Ordered: Current medicines are reviewed at length with the patient today.  Concerns regarding medicines are outlined above.  The following changes have been made:  no change   Labs/tests ordered: Orders Placed This Encounter  Procedures   Lipid panel   Lipoprotein A (LPA)   Hepatic function panel    Medication Changes: No orders of the defined types were placed in this encounter.   Current medicines are reviewed at length with the patient today.  The patient does not have concerns regarding medicines.  I spent 32 minutes reviewing all clinical data during and prior to this visit including all relevant imaging studies, laboratories, clinical information from other health systems and prior notes from both Cardiology and other specialties, interviewing the patient, conducting a complete physical  examination, and coordinating care in order to formulate a comprehensive and personalized evaluation and treatment plan.   History of Present Illness:      FOCUSED PROBLEM LIST:   Ascending aortic aneurysm Monitored by CT surgery Aortic insufficiency Mild to moderate; EF 65 to 70% TTE January 2024 CAD Mild; coronary CTA 2021 Hyperlipidemia Intolerant of Crestor and atorvastatin  On Nexlizet  Aortic atherosclerosis CT abdomen pelvis 2024 BMI 32  1/25: The patient returns for cardiology follow-up.  He was last seen about 6 months ago by Dr. Dann.  At that time he was doing well no changes were made to his medical regimen.  He was seen by Dr. Kerrin.  His aneurysm was stable at around 4.7 to 4.8 cm in he was recommended CT scanning every 6 months.  The patient is doing very well.  He had issues with a lot of GI intolerances but he has modified his diet to help with this.  He is no longer on any statins and this has helped his dietary issues.  He is lost about 20 pounds from changing his diet.  He is exercising on a regular basis.  He denies any exertional angina or severe exertional dyspnea.  He occasionally gets palpitations maybe once a week but this does not happen on a couple weeks.  They last maybe 10 seconds.  He denies any presyncope or syncope.  He is required no emergency room visits or hospitalizations recently.          Current Medications: Current Meds  Medication Sig   acetaminophen  (TYLENOL ) 500 MG tablet Take 500 mg by  mouth every 6 (six) hours as needed.   aspirin 81 MG tablet Take 81 mg by mouth daily.   Bempedoic Acid-Ezetimibe  (NEXLIZET ) 180-10 MG TABS Take 1 tablet by mouth daily.   metoprolol  succinate (TOPROL -XL) 25 MG 24 hr tablet Take 1 tablet (25 mg total) by mouth daily.   sildenafil  (REVATIO ) 20 MG tablet TAKE 3 TO 5 TABLETS BY MOUTH ONCE DAILY AS NEEDED     Review of Systems:   Please see the history of present illness.    All other systems  reviewed and are negative.     EKGs/Labs/Other Test Reviewed:   EKG: EKG done July 2024 demonstrates sinus tachycardia  EKG Interpretation Date/Time:    Ventricular Rate:    PR Interval:    QRS Duration:    QT Interval:    QTC Calculation:   R Axis:      Text Interpretation:           Risk Assessment/Calculations:          Physical Exam:   VS:  BP 116/74   Pulse 64   Ht 6' 2 (1.88 m)   Wt 229 lb 3.2 oz (104 kg)   SpO2 92%   BMI 29.43 kg/m        Wt Readings from Last 3 Encounters:  08/01/23 229 lb 3.2 oz (104 kg)  07/09/23 237 lb (107.5 kg)  05/30/23 236 lb (107 kg)      GENERAL:  No apparent distress, AOx3 HEENT:  No carotid bruits, +2 carotid impulses, no scleral icterus CAR: RRR no murmurs, gallops, rubs, or thrills RES:  Clear to auscultation bilaterally ABD:  Soft, nontender, nondistended, positive bowel sounds x 4 VASC:  +2 radial pulses, +2 carotid pulses NEURO:  CN 2-12 grossly intact; motor and sensory grossly intact PSYCH:  No active depression or anxiety EXT:  No edema, ecchymosis, or cyanosis  Signed, Lurena MARLA Red, MD  08/01/2023 9:03 AM    [facility] [address] [place], [place], [place]  [address] Phone: [phone]; Fax: [phone]   Note:  This document was prepared using Dragon voice recognition software and may include unintentional dictation errors.

## 2023-08-01 ENCOUNTER — Encounter: Payer: Self-pay | Admitting: Internal Medicine

## 2023-08-01 ENCOUNTER — Ambulatory Visit: Payer: BC Managed Care – PPO | Attending: Internal Medicine | Admitting: Internal Medicine

## 2023-08-01 VITALS — BP 116/74 | HR 64 | Ht 74.0 in | Wt 229.2 lb

## 2023-08-01 DIAGNOSIS — I700 Atherosclerosis of aorta: Secondary | ICD-10-CM | POA: Diagnosis not present

## 2023-08-01 DIAGNOSIS — I7121 Aneurysm of the ascending aorta, without rupture: Secondary | ICD-10-CM | POA: Diagnosis not present

## 2023-08-01 DIAGNOSIS — I2510 Atherosclerotic heart disease of native coronary artery without angina pectoris: Secondary | ICD-10-CM | POA: Diagnosis not present

## 2023-08-01 DIAGNOSIS — E785 Hyperlipidemia, unspecified: Secondary | ICD-10-CM | POA: Diagnosis not present

## 2023-08-01 DIAGNOSIS — Z6832 Body mass index (BMI) 32.0-32.9, adult: Secondary | ICD-10-CM

## 2023-08-01 NOTE — Patient Instructions (Signed)
 Medication Instructions:  Your physician recommends that you continue on your current medications as directed. Please refer to the Current Medication list given to you today.  *If you need a refill on your cardiac medications before your next appointment, please call your pharmacy*  Lab Work: TODAY (go to 1st floor, [address]): lipid panel, LFTs, LP(a) If you have labs (blood work) drawn today and your tests are completely normal, you will receive your results only by: MyChart Message (if you have MyChart) OR A paper copy in the mail If you have any lab test that is abnormal or we need to change your treatment, we will call you to review the results.  Testing/Procedures: None ordered today.  Follow-Up: At [facility], you and your health needs are our priority.  As part of our continuing mission to provide you with exceptional heart care, we have created designated Provider Care Teams.  These Care Teams include your primary Cardiologist (physician) and Advanced Practice Providers (APPs -  Physician Assistants and Nurse Practitioners) who all work together to provide you with the care you need, when you need it.  Your next appointment:   1 year(s)  The format for your next appointment:   In Person  Provider:   Lurena MARLA Red, MD {  Other Instructions    1st Floor: - Lobby - Registration  - Pharmacy  - Lab - Cafe  2nd Floor: - PV Lab - Diagnostic Testing (echo, CT, nuclear med)  3rd Floor: - Vacant  4th Floor: - TCTS (cardiothoracic surgery) - AFib Clinic - Structural Heart Clinic - Vascular Surgery  - Vascular Ultrasound  5th Floor: - HeartCare Cardiology (general and EP) - Clinical Pharmacy for coumadin, hypertension, lipid, weight-loss medications, and med management appointments    Valet parking services will be available as well.

## 2023-08-02 LAB — HEPATIC FUNCTION PANEL
ALT: 46 [IU]/L — ABNORMAL HIGH (ref 0–44)
AST: 83 [IU]/L — ABNORMAL HIGH (ref 0–40)
Albumin: 4.7 g/dL (ref 3.9–4.9)
Alkaline Phosphatase: 114 [IU]/L (ref 44–121)
Bilirubin Total: 0.7 mg/dL (ref 0.0–1.2)
Bilirubin, Direct: 0.23 mg/dL (ref 0.00–0.40)
Total Protein: 7.3 g/dL (ref 6.0–8.5)

## 2023-08-02 LAB — LIPID PANEL
Chol/HDL Ratio: 4.4 {ratio} (ref 0.0–5.0)
Cholesterol, Total: 160 mg/dL (ref 100–199)
HDL: 36 mg/dL — ABNORMAL LOW (ref >39)
LDL Chol Calc (NIH): 98 mg/dL (ref 0–99)
Triglycerides: 147 mg/dL (ref 0–149)
VLDL Cholesterol Cal: 26 mg/dL (ref 5–40)

## 2023-08-02 LAB — LIPOPROTEIN A (LPA): Lipoprotein (a): <8.4 nmol/L (ref <75.0)

## 2023-08-03 ENCOUNTER — Encounter: Payer: Self-pay | Admitting: Internal Medicine

## 2023-08-06 ENCOUNTER — Other Ambulatory Visit: Payer: Self-pay | Admitting: *Deleted

## 2023-08-06 DIAGNOSIS — E785 Hyperlipidemia, unspecified: Secondary | ICD-10-CM

## 2023-08-06 NOTE — Progress Notes (Signed)
Referral to pharmD placed.

## 2023-09-25 ENCOUNTER — Other Ambulatory Visit: Payer: Self-pay | Admitting: Interventional Cardiology

## 2023-09-27 ENCOUNTER — Telehealth: Payer: Self-pay | Admitting: Pharmacist

## 2023-09-27 ENCOUNTER — Encounter: Payer: Self-pay | Admitting: Pharmacist

## 2023-09-27 ENCOUNTER — Other Ambulatory Visit (HOSPITAL_COMMUNITY): Payer: Self-pay

## 2023-09-27 ENCOUNTER — Telehealth: Payer: Self-pay | Admitting: Pharmacy Technician

## 2023-09-27 ENCOUNTER — Ambulatory Visit: Payer: BC Managed Care – PPO | Attending: Internal Medicine | Admitting: Pharmacist

## 2023-09-27 VITALS — Wt 221.0 lb

## 2023-09-27 DIAGNOSIS — E7849 Other hyperlipidemia: Secondary | ICD-10-CM | POA: Diagnosis not present

## 2023-09-27 DIAGNOSIS — E785 Hyperlipidemia, unspecified: Secondary | ICD-10-CM

## 2023-09-27 MED ORDER — ATORVASTATIN CALCIUM 10 MG PO TABS: 10.0000 mg | ORAL_TABLET | ORAL | 3 refills | Status: DC

## 2023-09-27 NOTE — Telephone Encounter (Signed)
 Pharmacy Patient Advocate Encounter   Received notification from Pt Calls Messages that prior authorization for repatha is required/requested.   Insurance verification completed.   The patient is insured through [facility] .   Per test claim: PA required; PA submitted to above mentioned insurance via CoverMyMeds Key/confirmation #/EOC [id number] Status is pending

## 2023-09-27 NOTE — Progress Notes (Signed)
 Patient ID: Steven [name]                 DOB: 14-Oct-1960                    MRN: [id number]      HPI: Steven [name] is a 63 y.o. male patient referred to lipid clinic by Dr.Thukkani. PMH is significant for CAD,HLD, elevated BMI, aortic atherosclerosis He had a CAC of 89 (83rd percentile) in 2021. Mild non-obstructive CAD (25-49%) in the proximal LAD. Patient has been on atorvastatin 20mg  daily. LDL-C in 106 on 05/25/21. Patient did not want to increase atorvastatin due to shoulder pain and he had previously not tolerated rosuvastatin.  he was put on Repatha, due to side effects it was changed to Nexlizet.    Patient presented today for lipid clinic. Reports he get nausea from one of his medications. He just finished the Nexlizet supply early this week. In the past Repatha was rising his BG level and causing nausea so he went off of that. He was not aware that he is prediabetic but the last few A1c was >5.6 so inform that it is pre-diabetic range. Him and his wife follow healthy meal plan, they don't eat out much and drinks water and ginger ale. Ginger ale helps with nausea, advised to chew on to fresh ginger chips as ginger ale would have lot of sugar.   Last 6 months lost 25 lbs. Partly due to nausea and partly from healthy diet changes. He is ready to re try low dose Lipitor and retry Repatha  Reviewed options for lowering LDL cholesterol, including ezetimibe, PCSK-9 inhibitors, bempedoic acid and inclisiran.  Discussed mechanisms of action, dosing, side effects and potential decreases in LDL cholesterol.  Also reviewed cost information and potential options for patient assistance.  Current Medications:  Nexlizet 180/10 mg daily  Intolerances: Repatha- elevated sugar, fatigue, dry mouth  Risk Factors: CAD,HLD, elevated BMI, aortic atherosclerosis  LDL goal: <70 mg/dl  Last lab 78/29: TC 562, TG 147, HDL 36, LDLc 98 Lp(a)<8.5, ( while on Nexlizet) AST elevated and ALT elevated  Labs on  08/2022: LDL 48, TG 104, TC 103 while on Repatha   Diet:  Follows Healthy meal plan - salmon, greens, grilled chicken  Drink: water  Snack: nuts - salted , ice creams  Exercise: gym- 3 days a week (60 min cardio)   Family History:  Alzheimer's disease in his father; Cancer in his mother; Diabetes in his brother and another family member; Kidney disease in an other family member; Parkinsonism in his father.   Social History:  Alcohol: 2 glasses every 6 months  Smoking: quit smoking 15 years ago   Labs:  Lipid Panel     Component Value Date/Time   CHOL 160 08/01/2023 0931   TRIG 147 08/01/2023 0931   HDL 36 (L) 08/01/2023 0931   CHOLHDL 4.4 08/01/2023 0931   CHOLHDL 4 03/13/2021 0941   VLDL 25.2 03/13/2021 0941   LDLCALC 98 08/01/2023 0931   LDLDIRECT 162.0 03/06/2017 1719   LABVLDL 26 08/01/2023 0931    Past Medical History:  Diagnosis Date   AAA (abdominal aortic aneurysm) (HCC)    Allergy    At risk for sleep apnea    STOP-BANG= 4     SENT TO PCP 04-15-2014   BRBPR (bright red blood per rectum) 07/14/2015   ED (erectile dysfunction) 03/10/2013   Epididymal cyst    LEFT   Frozen shoulder  Seeing emerge othro was given a shot 04-2023   GERD (gastroesophageal reflux disease)    Heart murmur mild mvp  -- asymptomatic   per pt echo 2005   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 Wanda Plump with URO for benign noduarity in left hemiscrotum (2008) felt to be variant of normal.   Left nephrolithiasis    Testicular pain 12/01/2010   Per Dr. Patsi Sears, thought to be due to epididymal cysts    Thoracic aortic aneurysm (HCC) 04/18/2017   VARICOCELE 02/17/2010   Qualifier: Diagnosis of  By: Etheleen Mayhew CMA (AAMA), [name]      Current Outpatient Medications on File Prior to Visit  Medication Sig Dispense Refill    acetaminophen (TYLENOL) 500 MG tablet Take 500 mg by mouth every 6 (six) hours as needed.     aspirin 81 MG tablet Take 81 mg by mouth daily.     metoprolol succinate (TOPROL-XL) 25 MG 24 hr tablet TAKE 1 TABLET (25 MG TOTAL) BY MOUTH DAILY. 90 tablet 3   sildenafil (REVATIO) 20 MG tablet TAKE 3 TO 5 TABLETS BY MOUTH ONCE DAILY AS NEEDED 150 tablet 5   No current facility-administered medications on file prior to visit.    Allergies  Allergen Reactions   Crestor [Rosuvastatin Calcium] Other (See Comments)    Irritable.     Naproxen Sodium Swelling   Penicillins Swelling   Repatha [Evolocumab]     Elevated sugar, fatigue, dry mouth.    Erythromycin Rash    Assessment/Plan:  1. Hyperlipidemia -  Problem  Hld (Hyperlipidemia)   Qualifier: Diagnosis of  By: Etheleen Mayhew CMA (AAMA), [name]   Current Medications:  Nexlizet 180/10 mg daily  Intolerances: Repatha- elevated sugar, fatigue, dry mouth, various statins -  severe joint pain   Risk Factors: CAD,HLD, elevated BMI, aortic atherosclerosis  LDL goal: <70 mg/dl  Last lab 16/10: TC 960, TG 147, HDL 36, LDLc 98 Lp(a)<8.5, ( while on Nexlizet) AST elevated and ALT elevated  Labs 08/2022: LDL 48, TG 104, TC 103  while on Repatha      HLD (hyperlipidemia) Assessment and Plan :  LDL goal: <70 mg/dl last LDLc 98 mg/dl while on Nexlizet  Intolerance to multiple statins reported in the past due to severe joint pain mainly shoulders  Could not  tolerate Repatha - nausea and patient thinks that it was affecting his BG, A1c before Repatha 5.9 and while on Repatha 6  Discussed the benefit and common side effects of low dose statin and Repatha  Nexlizet is not enough to lower LDL and AST and ALT elevated also causing nausea ( done Nexlizet supply will stay off of Nexlizet to see if nausea truly from the drug)  Patient is in agreement to re-try low dose Lipitor - never tried 10 mg 3 times a week so will try and re try Repatha  Will initiate  PA for Repatha  Lipid lab due in 2-3 months of therapy change     Thank you,  Carmela Hurt, Pharm.D [facility] A Division of [facility] [address] [place], [place] [address]  Phone: [phone]; Fax: [phone]

## 2023-09-27 NOTE — Telephone Encounter (Signed)
 Insurance sent a form back stating needed to do this pa as a continuation. I submitted the form but made a note that the patient stopped in 2023 and is starting back today

## 2023-09-27 NOTE — Patient Instructions (Signed)
 Your Results:             Your most recent labs Goal  Total Cholesterol 160 < 200  Triglycerides 147 < 150  HDL (happy/good cholesterol) 36 > 40  LDL (lousy/bad cholesterol 162 < 70   Medication changes: Start taking Lipitor 10 mg 3 times per week.We will start the process to get PCSK9i (Repatha or Praluent)  covered by your insurance.  Once the prior authorization is complete, we will call you to let you know and confirm pharmacy information.      Praluent is a cholesterol medication that improved your body's ability to get rid of "bad cholesterol" known as LDL. It can lower your LDL up to 60%. It is an injection that is given under the skin every 2 weeks. The most common side effects of Praluent include runny nose, symptoms of the common cold, rarely flu or flu-like symptoms, back/muscle pain in about 3-4% of the patients, and redness, pain, or bruising at the injection site.    Repatha is a cholesterol medication that improved your body's ability to get rid of "bad cholesterol" known as LDL. It can lower your LDL up to 60%! It is an injection that is given under the skin every 2 weeks. The medication often requires a prior authorization from your insurance company. We will take care of submitting all the necessary information to your insurance company to get it approved. The most common side effects of Repatha include runny nose, symptoms of the common cold, rarely flu or flu-like symptoms, back/muscle pain in about 3-4% of the patients, and redness, pain, or bruising at the injection site.   Lab orders: We want to repeat labs after 2-3 months.  We will send you a lab order to remind you once we get closer to that time.

## 2023-09-27 NOTE — Assessment & Plan Note (Signed)
 Assessment and Plan :  LDL goal: <70 mg/dl last LDLc 98 mg/dl while on Nexlizet  Intolerance to multiple statins reported in the past due to severe joint pain mainly shoulders  Could not  tolerate Repatha - nausea and patient thinks that it was affecting his BG, A1c before Repatha 5.9 and while on Repatha 6  Discussed the benefit and common side effects of low dose statin and Repatha  Nexlizet is not enough to lower LDL and AST and ALT elevated also causing nausea ( done Nexlizet supply will stay off of Nexlizet to see if nausea truly from the drug)  Patient is in agreement to re-try low dose Lipitor - never tried 10 mg 3 times a week so will try and re try Repatha  Will initiate PA for Repatha  Lipid lab due in 2-3 months of therapy change

## 2023-09-30 ENCOUNTER — Other Ambulatory Visit: Payer: Self-pay

## 2023-09-30 ENCOUNTER — Other Ambulatory Visit (HOSPITAL_COMMUNITY): Payer: Self-pay

## 2023-09-30 MED ORDER — REPATHA SURECLICK 140 MG/ML ~~LOC~~ SOAJ
140.0000 mg | SUBCUTANEOUS | 3 refills | Status: DC
  Filled 2023-09-30: qty 2, 28d supply, fill #0
  Filled 2023-10-24: qty 2, 28d supply, fill #1

## 2023-09-30 NOTE — Addendum Note (Signed)
 Addended by: Tylene Fantasia on: 09/30/2023 01:29 PM   Modules accepted: Orders

## 2023-09-30 NOTE — Telephone Encounter (Signed)
 Pharmacy Patient Advocate Encounter  Received notification from [facility] that Prior Authorization for repatha has been APPROVED from 09/27/23 to 09/26/24. Ran test claim, Copay is $24.99. This test claim was processed through [facility]- copay amounts may vary at other pharmacies due to pharmacy/plan contracts, or as the patient moves through the different stages of their insurance plan.   PA #/Case ID/Reference #: [id number]

## 2023-10-18 NOTE — Telephone Encounter (Signed)
 PA approved see other encounter for more info

## 2023-10-22 DIAGNOSIS — E785 Hyperlipidemia, unspecified: Secondary | ICD-10-CM | POA: Diagnosis not present

## 2023-10-22 LAB — LIPID PANEL
Chol/HDL Ratio: 2.6 ratio (ref 0.0–5.0)
Cholesterol, Total: 112 mg/dL (ref 100–199)
HDL: 43 mg/dL (ref >39)
LDL Chol Calc (NIH): 50 mg/dL (ref 0–99)
Triglycerides: 100 mg/dL (ref 0–149)
VLDL Cholesterol Cal: 19 mg/dL (ref 5–40)

## 2023-10-23 ENCOUNTER — Encounter: Payer: Self-pay | Admitting: Pharmacist

## 2023-10-24 ENCOUNTER — Other Ambulatory Visit: Payer: Self-pay

## 2023-10-24 ENCOUNTER — Encounter: Payer: Self-pay | Admitting: Pharmacist

## 2023-10-24 ENCOUNTER — Other Ambulatory Visit (HOSPITAL_COMMUNITY): Payer: Self-pay

## 2023-10-25 ENCOUNTER — Other Ambulatory Visit (HOSPITAL_COMMUNITY): Payer: Self-pay

## 2023-10-25 ENCOUNTER — Telehealth: Payer: Self-pay

## 2023-10-25 NOTE — Telephone Encounter (Signed)
 Pharmacy Patient Advocate Encounter  Insurance verification completed.   The patient is insured through  [facility]   Ran test claim for REPATHA . Currently a quantity of 2 ML is a 28 day supply and the co-pay is $518.59 . The current 28 day co-pay is, $518.59.  No PA needed at this time.  This test claim was processed through [facility]- copay amounts may vary at other pharmacies due to pharmacy/plan contracts, or as the patient moves through the different stages of their insurance plan.     MAXED OUT MFR COUPON  DEDUCTIBLE IS OVER 2K

## 2023-10-31 MED ORDER — REPATHA SURECLICK 140 MG/ML ~~LOC~~ SOAJ: 140.0000 mg | SUBCUTANEOUS | 11 refills | Status: DC

## 2023-11-20 NOTE — Telephone Encounter (Signed)
 Spoke with the patient by phone to address concerns regarding a possible allergic reaction. Advised the patient to skip the next dose of Repatha  until the itching resolves. Plan to reattempt Repatha  in approximately 3 weeks if symptoms have subsided.  Will follow up in 2 weeks to assess whether the itching has improved and determine next steps accordingly.

## 2023-11-28 ENCOUNTER — Telehealth: Payer: Self-pay | Admitting: Pharmacy Technician

## 2023-11-28 NOTE — Telephone Encounter (Signed)
 Received notification nexlizet  needed a prior authorization but it does not. Also last note said he is on nexlizet  but no recent claims for it

## 2023-12-05 NOTE — Telephone Encounter (Signed)
 Call to follow up on Repatha  related itchiness. Patient states it has improved but he is going out of country leaving this Friday and back on June 24. Will retry Repatha  that week and will follow up on July 1 to assess tolerability.

## 2023-12-06 ENCOUNTER — Other Ambulatory Visit: Payer: Self-pay | Admitting: Thoracic Surgery (Cardiothoracic Vascular Surgery)

## 2023-12-06 DIAGNOSIS — I7121 Aneurysm of the ascending aorta, without rupture: Secondary | ICD-10-CM

## 2023-12-31 ENCOUNTER — Ambulatory Visit (INDEPENDENT_AMBULATORY_CARE_PROVIDER_SITE_OTHER): Admitting: Family Medicine

## 2023-12-31 ENCOUNTER — Encounter: Payer: Self-pay | Admitting: Family Medicine

## 2023-12-31 VITALS — BP 126/68 | HR 80 | Temp 98.9°F | Ht 72.25 in | Wt 223.0 lb

## 2023-12-31 DIAGNOSIS — R739 Hyperglycemia, unspecified: Secondary | ICD-10-CM | POA: Diagnosis not present

## 2023-12-31 DIAGNOSIS — Z0000 Encounter for general adult medical examination without abnormal findings: Secondary | ICD-10-CM

## 2023-12-31 DIAGNOSIS — E785 Hyperlipidemia, unspecified: Secondary | ICD-10-CM | POA: Diagnosis not present

## 2023-12-31 DIAGNOSIS — I7121 Aneurysm of the ascending aorta, without rupture: Secondary | ICD-10-CM

## 2023-12-31 DIAGNOSIS — L299 Pruritus, unspecified: Secondary | ICD-10-CM

## 2023-12-31 DIAGNOSIS — Z7189 Other specified counseling: Secondary | ICD-10-CM

## 2023-12-31 DIAGNOSIS — N529 Male erectile dysfunction, unspecified: Secondary | ICD-10-CM

## 2023-12-31 MED ORDER — SILDENAFIL CITRATE 20 MG PO TABS: 60.0000 mg | ORAL_TABLET | Freq: Every day | ORAL | 5 refills | Status: AC | PRN

## 2023-12-31 MED ORDER — FEXOFENADINE HCL 180 MG PO TABS: 180.0000 mg | ORAL_TABLET | Freq: Every day | ORAL | Status: AC

## 2023-12-31 NOTE — Progress Notes (Unsigned)
 CPE- See plan.  Routine anticipatory guidance given to patient.  See health maintenance.  The possibility exists that previously documented standard health maintenance information may have been brought forward from a previous encounter into this note.  If needed, that same information has been updated to reflect the current situation based on today's encounter.    Tetanus 2015 Flu due in the fall.  Covid prev done.  Shingles d/w pt.   PNA could be done at 64.   Prostate cancer screening and PSA options (with potential risks and benefits of testing vs not testing) were discussed along with recent recs/guidelines.  He declined testing PSA at this point.  Colonoscopy 2023 Wife designated if patient were incapacitated.    No CP with exertion.  Not SOB unless sig exercise.  He can still exercise 3x/week.    He had itching, stopped repatha  for 6 weeks.  Itching didn't stop.  Has had 1 more dose of repatha , 2 weeks ago, no change in itching with that.  Prev lipids at goal.  The itching can be in the bilateral neck, axilla, groin, feet.  No rash.  His symptoms do improve with using Allegra , for about 20 hours.  He has had itching for the last 3 months intermittently.  No lip or tongue swelling.  No wheeze.  He does have a history of weight loss but this is with work on diet and exercise.  TAA with f/u pending per vascular.    Prev nausea is better with avoiding overeating.    Sildenafil  helped ED w/o ADE on med.   PMH and SH reviewed  Meds, vitals, and allergies reviewed.   ROS: Per HPI.  Unless specifically indicated otherwise in HPI, the patient denies:  General: fever. Eyes: acute vision changes ENT: sore throat Cardiovascular: chest pain Respiratory: SOB GI: vomiting GU: dysuria Musculoskeletal: acute back pain Derm: acute rash Neuro: acute motor dysfunction Psych: worsening mood Endocrine: polydipsia Heme: bleeding Allergy: hayfever  GEN: nad, alert and oriented HEENT: mucous  membranes moist NECK: supple w/o LA CV: rrr. SEM noted.  PULM: ctab, no inc wob ABD: soft, +bs EXT: no edema SKIN: no acute rash  Likely varicose vein on the medial proximal L shin Not ttp but possible cortical irregularity on the tibia deep to the area- that is longstanding.  H/o distant L lower leg injury with subsequent neg imaging.

## 2023-12-31 NOTE — Patient Instructions (Addendum)
 Let me see you labs and see about options re: itching.   I'll await your follow up scan.  Take care.  Glad to see you. I would get a flu shot each fall.

## 2024-01-01 DIAGNOSIS — L299 Pruritus, unspecified: Secondary | ICD-10-CM | POA: Insufficient documentation

## 2024-01-01 DIAGNOSIS — Z7189 Other specified counseling: Secondary | ICD-10-CM | POA: Insufficient documentation

## 2024-01-01 LAB — CBC WITH DIFFERENTIAL/PLATELET
Basophils Absolute: 0.1 K/uL (ref 0.0–0.1)
Basophils Relative: 0.9 % (ref 0.0–3.0)
Eosinophils Absolute: 0.3 K/uL (ref 0.0–0.7)
Eosinophils Relative: 4.3 % (ref 0.0–5.0)
HCT: 40.1 % (ref 39.0–52.0)
Hemoglobin: 13.6 g/dL (ref 13.0–17.0)
Lymphocytes Relative: 38.2 % (ref 12.0–46.0)
Lymphs Abs: 2.6 K/uL (ref 0.7–4.0)
MCHC: 33.9 g/dL (ref 30.0–36.0)
MCV: 94 fl (ref 78.0–100.0)
Monocytes Absolute: 0.7 K/uL (ref 0.1–1.0)
Monocytes Relative: 10.2 % (ref 3.0–12.0)
Neutro Abs: 3.1 K/uL (ref 1.4–7.7)
Neutrophils Relative %: 46.4 % (ref 43.0–77.0)
Platelets: 142 K/uL — ABNORMAL LOW (ref 150.0–400.0)
RBC: 4.27 Mil/uL (ref 4.22–5.81)
RDW: 13.4 % (ref 11.5–15.5)
WBC: 6.8 K/uL (ref 4.0–10.5)

## 2024-01-01 LAB — COMPREHENSIVE METABOLIC PANEL WITH GFR
ALT: 24 U/L (ref 0–53)
AST: 30 U/L (ref 0–37)
Albumin: 4.5 g/dL (ref 3.5–5.2)
Alkaline Phosphatase: 101 U/L (ref 39–117)
BUN: 10 mg/dL (ref 6–23)
CO2: 28 meq/L (ref 19–32)
Calcium: 9.3 mg/dL (ref 8.4–10.5)
Chloride: 106 meq/L (ref 96–112)
Creatinine, Ser: 0.89 mg/dL (ref 0.40–1.50)
GFR: 90.59 mL/min (ref >60.00)
Glucose, Bld: 105 mg/dL — ABNORMAL HIGH (ref 70–99)
Potassium: 3.7 meq/L (ref 3.5–5.1)
Sodium: 141 meq/L (ref 135–145)
Total Bilirubin: 0.8 mg/dL (ref 0.2–1.2)
Total Protein: 6.9 g/dL (ref 6.0–8.3)

## 2024-01-01 LAB — TSH: TSH: 1.12 u[IU]/mL (ref 0.35–5.50)

## 2024-01-01 LAB — HEMOGLOBIN A1C: Hgb A1c MFr Bld: 5.7 % (ref 4.6–6.5)

## 2024-01-01 NOTE — Assessment & Plan Note (Signed)
 TAA with f/u pending per vascular.

## 2024-01-01 NOTE — Assessment & Plan Note (Signed)
 Tetanus 2015 Flu due in the fall.  Covid prev done.  Shingles d/w pt.   PNA could be done at 64.   Prostate cancer screening and PSA options (with potential risks and benefits of testing vs not testing) were discussed along with recent recs/guidelines.  He declined testing PSA at this point.  Colonoscopy 2023 Wife designated if patient were incapacitated.

## 2024-01-01 NOTE — Assessment & Plan Note (Signed)
 Sildenafil  helped ED w/o ADE on med.  Would can continue as is.

## 2024-01-01 NOTE — Assessment & Plan Note (Signed)
 Of unclear source.  See notes on labs.  This does not sound typical for a drug issue and he did not have a change in symptoms with stopping Repatha .  I would like to consider options in the meantime.  Allegra  does help.  Would continue Allegra  as is.  Would not take Allegra -D.  Discussed.

## 2024-01-01 NOTE — Assessment & Plan Note (Signed)
 Wife designated if patient were incapacitated.

## 2024-01-05 ENCOUNTER — Ambulatory Visit: Payer: Self-pay | Admitting: Family Medicine

## 2024-01-07 ENCOUNTER — Ambulatory Visit (HOSPITAL_COMMUNITY)
Admission: RE | Admit: 2024-01-07 | Discharge: 2024-01-07 | Disposition: A | Discharge status: Home / Self Care | Source: Ambulatory Visit | Attending: Thoracic Surgery (Cardiothoracic Vascular Surgery) | Admitting: Thoracic Surgery (Cardiothoracic Vascular Surgery)

## 2024-01-07 DIAGNOSIS — N200 Calculus of kidney: Secondary | ICD-10-CM | POA: Diagnosis not present

## 2024-01-07 DIAGNOSIS — J439 Emphysema, unspecified: Secondary | ICD-10-CM | POA: Diagnosis not present

## 2024-01-07 DIAGNOSIS — I517 Cardiomegaly: Secondary | ICD-10-CM | POA: Diagnosis not present

## 2024-01-07 DIAGNOSIS — I7121 Aneurysm of the ascending aorta, without rupture: Secondary | ICD-10-CM | POA: Insufficient documentation

## 2024-01-07 MED ORDER — IOHEXOL 350 MG/ML SOLN
75.0000 mL | Freq: Once | INTRAVENOUS | Status: AC | PRN
  Administered 2024-01-07: 75 mL via INTRAVENOUS

## 2024-01-09 ENCOUNTER — Other Ambulatory Visit: Payer: Self-pay | Admitting: Family Medicine

## 2024-01-09 DIAGNOSIS — L299 Pruritus, unspecified: Secondary | ICD-10-CM

## 2024-01-09 NOTE — Progress Notes (Deleted)
 [address]               [place], [place] [address]                      [phone]  PCP is Cleatus Arlyss RAMAN, MD Referring Provider is Cleatus Arlyss RAMAN, MD  Chief Complaint: Ascending thoracic aortic aneurysm  HPI: This is a 63 year old male with a past medical history of hyperlipidemia, hypogonadism, tobacco use, thoracic aortic atherosclerosis, coronary atherosclerosis, ascending thoracic aneurysm, heart murmur, mild aortic stenosis, mild to moderate aortic insufficiency, hyperlipidemia, nephrolithiasis, positive PPD, and chronic nausea. He was found to have an aneurysm in 2018 on a CT for lung cancer screening. He was last seen by Dr. Kerrin in January 2025 and the ATAA measured 4.7-4.8 cm. He presents today for further surveillance of his ATAA. He denies chest pain, pressure, or tightness.   Past Medical History:  Diagnosis Date   AAA (abdominal aortic aneurysm) (HCC)    Allergy    At risk for sleep apnea    STOP-BANG= 4     SENT TO PCP 04-15-2014   BRBPR (bright red blood per rectum) 07/14/2015   ED (erectile dysfunction) 03/10/2013   Epididymal cyst    LEFT   Frozen shoulder    Seeing emerge othro was given a shot 04-2023   GERD (gastroesophageal reflux disease)    Heart murmur mild mvp  -- asymptomatic   per pt echo 2005   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 Amiel with [facility] for benign noduarity in left hemiscrotum (2008) felt to be variant of normal.   Left nephrolithiasis    Testicular pain 12/01/2010   Per Dr. Chales, thought to be due to epididymal cysts    Thoracic aortic aneurysm (HCC) 04/18/2017   VARICOCELE 02/17/2010   Qualifier: Diagnosis of  By: Dee CMA LEODIS), [name]      Past Surgical History:  Procedure Laterality Date   COLONOSCOPY      COLONOSCOPY WITH PROPOFOL   01-06-2014   POLYPECTOMY   CYSTOSCOPY WITH RETROGRADE PYELOGRAM, URETEROSCOPY AND STENT PLACEMENT Left 04/19/2014   Procedure: CYSTOSCOPY WITH RETROGRADE PYELOGRAM with interpretation, URETEROSCOPY, stone extraction with basket, AND STENT PLACEMENT, implantation of backstop;  Surgeon: Arlena LILLETTE Chales, MD;  Location: [facility];  Service: Urology;  Laterality: Left;   EXCISION SEBACEOUS CYST X3 OF BACK  03-13-2010   HOLMIUM LASER APPLICATION Left 04/19/2014   Procedure: HOLMIUM LASER APPLICATION;  Surgeon: Arlena LILLETTE Chales, MD;  Location: [facility];  Service: Urology;  Laterality: Left;   NASAL SEPTUM SURGERY  09-21-2002   POLYPECTOMY     REFRACTIVE SURGERY  1994    Family History  Problem Relation Age of Onset   Cancer Mother        Renal CA, treated 1993   Parkinsonism Father    Alzheimer's disease Father    Diabetes Brother    Diabetes Other    Kidney disease Other    Colon cancer Neg Hx    Prostate cancer Neg Hx    Esophageal cancer Neg Hx    Rectal cancer Neg Hx    Stomach  cancer Neg Hx    Pancreatic cancer Neg Hx    Colon polyps Neg Hx     Social History Social History   Tobacco Use   Smoking status: Former    Current packs/day: 0.00    Average packs/day: 1.5 packs/day for 34.0 years (51.0 ttl pk-yrs)    Types: Cigarettes    Start date: 10/30/1974    Quit date: 10/29/2008    Years since quitting: 15.2   Smokeless tobacco: Never  Vaping Use   Vaping status: Never Used  Substance Use Topics   Alcohol use: Not Currently    Comment: Rare- couple times per year   Drug use: Never    Current Outpatient Medications  Medication Sig Dispense Refill   acetaminophen  (TYLENOL ) 500 MG tablet Take 500 mg by mouth every 6 (six) hours as needed.     aspirin 81 MG tablet Take 81 mg by mouth daily.     atorvastatin  (LIPITOR) 10 MG tablet Take 1 tablet (10 mg total) by mouth 3 (three) times a week. 30 tablet 3    Evolocumab  (REPATHA  SURECLICK) 140 MG/ML SOAJ Inject 140 mg into the skin every 14 (fourteen) days. 2 mL 11   fexofenadine  (ALLEGRA  ALLERGY) 180 MG tablet Take 1 tablet (180 mg total) by mouth daily.     metoprolol  succinate (TOPROL -XL) 25 MG 24 hr tablet TAKE 1 TABLET (25 MG TOTAL) BY MOUTH DAILY. 90 tablet 3   sildenafil  (REVATIO ) 20 MG tablet Take 3-5 tablets (60-100 mg total) by mouth daily as needed. 150 tablet 5    Allergies  Allergen Reactions   Crestor [Rosuvastatin Calcium ] Other (See Comments)    Irritable.     Naproxen Sodium Swelling   Penicillins Swelling   Repatha  [Evolocumab ]     Elevated sugar, fatigue, dry mouth.    Erythromycin Rash   Vital Signs:  Physical Exam: CV- Neck- Pulmonary- Abdomen- Extremities- Neurologic-   Diagnostic Tests:   Risk Modification in those with ascending thoracic aortic aneurysm:  Continue good control of blood pressure (prefer SBP 130/80 or less)-continue Metoprolol  Succinate (Toprol  XL)  2. Avoid fluoroquinolone antibiotics (I.e Ciprofloxacin , Avelox, Levofloxacin , Ofloxacin)  3.  Use of statin (to decrease cardiovascular risk)-continue Atorvastatin  (Lipitor) and Repatha   4.  Exercise and activity limitations is individualized, but in general, contact sports are to be  avoided and one should avoid heavy lifting (defined as half of ideal body weight) and exercises involving sustained Valsalva maneuver.  5. Counseling for those suspected of having genetically mediated disease. First-degree relatives of those with TAA disease should be screened as well as those who have a connective tissue disease (I.e with Marfan syndrome, Ehlers-Danlos syndrome,  and Loeys-Dietz syndrome) or a  bicuspid aortic valve,have an increased risk for  complications related to TAA. He denies family history of TAA and personal history of connective tissue disorder.  Echocardiogram done January 2024 showed LVEF 65-70%, mild to moderate AI, mild AS,  and moderate dilatation of the ascending aorta, measuring 48 mm.   6. If one has tobacco abuse, smoking cessation is highly encouraged.   Impression and Plan: CTA shows a *** cm ascending aortic aneurysm.  Echocardiogram shows a tri cuspid aortic valve with evidence of mild to moderate aortic regurgitation and mild aortic stenosis.  We will defer to cardiologist for further echocardiograms. We discussed the natural history and and risk factors for growth of ascending aortic aneurysms.  We covered the importance of smoking cessation, tight blood pressure control, refraining from lifting  heavy objects, and avoiding fluoroquinolones.  The patient is aware of signs and symptoms of aortic dissection and when to present to the emergency department.  We will continue surveillance and a repeat CTA was ordered for 6 months.   Kyla CHRISTELLA Donald, PA-C [facility] [phone]

## 2024-01-14 ENCOUNTER — Ambulatory Visit

## 2024-01-16 ENCOUNTER — Ambulatory Visit

## 2024-01-16 VITALS — BP 121/71 | HR 68 | Resp 20 | Ht 72.25 in | Wt 223.0 lb

## 2024-01-16 DIAGNOSIS — I7121 Aneurysm of the ascending aorta, without rupture: Secondary | ICD-10-CM | POA: Diagnosis not present

## 2024-01-16 NOTE — Patient Instructions (Signed)

## 2024-01-16 NOTE — Progress Notes (Signed)
 [address] [place] [address]             [phone]            Steven [name] [id number] [birth year]   History of Present Illness: [name] is a 63 year old man with medical history of mild aortic stenosis, hyperlipidemia, coronary atherosclerosis, former user of tobacco and chronic nausea presents for continued surveillance of ascending thoracic aortic aneurysm. He was first found to have an ascending aneurysm in 2018. It was noted on a low-dose CT done for lung cancer screening. He has been followed since that time.  CTA of chest on 01/07/2024 showed 4.7 ascending thoracic aortic aneurysm that is unchanged from previous scan 6 months ago.  He states that he is doing well.  He tries to exercise 3 times a week.  He does endorse getting some chest pain when initiating walking on the treadmill  This resolves after a few minutes and he is able to complete his workouts.  He continues to have good blood pressure control without the use of medications.  He denies shortness of breath, chest tightness, back pain and lower leg swelling.     Current Outpatient Medications on File Prior to Visit  Medication Sig Dispense Refill   acetaminophen  (TYLENOL ) 500 MG tablet Take 500 mg by mouth every 6 (six) hours as needed.     aspirin 81 MG tablet Take 81 mg by mouth daily.     atorvastatin  (LIPITOR) 10 MG tablet Take 1 tablet (10 mg total) by mouth 3 (three) times a week. 30 tablet 3   Evolocumab  (REPATHA  SURECLICK) 140 MG/ML SOAJ Inject 140 mg into the skin every 14 (fourteen) days. 2 mL 11   fexofenadine  (ALLEGRA  ALLERGY) 180 MG tablet Take 1 tablet (180 mg total) by mouth daily.     metoprolol  succinate (TOPROL -XL) 25 MG 24 hr tablet TAKE 1 TABLET (25 MG TOTAL) BY MOUTH DAILY. 90 tablet 3   sildenafil  (REVATIO ) 20 MG tablet Take 3-5 tablets (60-100 mg total) by mouth daily as needed. 150 tablet 5   No current facility-administered medications on  file prior to visit.     ROS: Review of Systems  Respiratory: Negative.  Negative for cough and shortness of breath.   Cardiovascular: Negative.  Negative for chest pain, palpitations and leg swelling.  Neurological: Negative.  Negative for headaches.  All other systems reviewed and are negative.    BP 121/71 (BP Location: Left Arm, Patient Position: Sitting, Cuff Size: Normal)   Pulse 68   Resp 20   Ht 6' 0.25 (1.835 m)   Wt 223 lb (101.2 kg)   SpO2 98% Comment: RA  BMI 30.04 kg/m   Physical Exam Constitutional:      Appearance: Normal appearance.  HENT:     Head: Normocephalic and atraumatic.  Cardiovascular:     Rate and Rhythm: Normal rate and regular rhythm.     Heart sounds: S1 normal and S2 normal. Murmur heard.     Systolic murmur is present with a grade of 2/6.  Pulmonary:     Effort: Pulmonary effort is normal.     Breath sounds: Normal breath sounds.  Musculoskeletal:     Right ankle: No swelling.     Left ankle: No swelling.  Skin:    General: Skin is warm and dry.  Neurological:     General: No focal deficit present.  Mental Status: He is alert and oriented to person, place, and time.      Imaging: CLINICAL DATA:  Aortic aneurysm suspected   EXAM: CT ANGIOGRAPHY CHEST WITH CONTRAST   TECHNIQUE: Multidetector CT imaging through the chest was performed using the standard protocol during bolus administration of intravenous contrast. Multiplanar reconstructed images and MIPs were obtained and reviewed to evaluate the vascular anatomy.   RADIATION DOSE REDUCTION: This exam was performed according to the departmental dose-optimization program which includes automated exposure control, adjustment of the mA and/or kV according to patient size and/or use of iterative reconstruction technique.   CONTRAST:  75mL OMNIPAQUE  IOHEXOL  350 MG/ML SOLN   COMPARISON:  July 02, 2023, June 02, 2021   FINDINGS: Pulmonary Embolism: While the exam was  not optimized for the evaluation of the pulmonary arteries, no central pulmonary embolism visualized.   Cardiovascular: Mild cardiomegaly. No pericardial effusion. Dense calcification noted along the aortic valve leaflets. The aortic root is not dilated. There is dilation of the ascending aorta, measuring 4.7 cm, unchanged. Scattered atherosclerosis in the aortic arch. The descending aorta is not dilated.   Mediastinum/Nodes: No mediastinal mass. No mediastinal, hilar, or axillary lymphadenopathy.   Lungs/Pleura: The midline trachea and bronchi are patent. Mild focal paraseptal emphysema along the posterior right lung apex. Posterior bibasilar dependent atelectasis. No focal airspace consolidation, pleural effusion, or pneumothorax. Unchanged 3 mm subpleural right middle lobe nodule (axial 317). 3 mm subpleural right middle lobe nodule (axial 261) and 3 mm nodule in the anterolateral aspect (axial 295), unchanged. Given the long-term stability, these are benign.   Musculoskeletal: No acute fracture or destructive bone lesion. 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   Upper Abdomen: No acute abnormality within the partially visualized upper abdomen. Nonobstructive left upper pole calculus.   Review of the MIP images confirms the above findings.   IMPRESSION: 1. Unchanged fusiform aneurysm of the ascending aorta, measuring 4.7 cm. No aortic dissection. Recommend semi-annual imaging followup by CTA or MRA and referral to cardiothoracic surgery if not already obtained. This recommendation follows 2010 ACCF/AHA/AATS/ACR/ASA/SCA/SCAI/SIR/STS/SVM Guidelines for the Diagnosis and Management of Patients With Thoracic Aortic Disease. Circulation. 2010; 121: Z733-z630. Aortic aneurysm NOS (ICD10-I71.9) 2. No pneumonia, pulmonary edema, or pleural effusion. 3. Nonobstructive left upper pole renal calculus.     Electronically Signed   By: Rogelia Myers M.D.    On: 01/10/2024 13:29     A/P: Aneurysm of ascending aorta without rupture (HCC) -4.7 cm fusiform aneurysm of the ascending aorta on CTA of chest on 01/07/2024.  Echocardiogram from 06/2022 showed that the aortic valve is tricuspid.There was mild aortic valve stenosis with mild to moderate aortic valve regurgitation.  We discussed the natural history and and risk factors for growth of ascending aortic aneurysms. Discussed recommendations to minimize the risk of further expansion or dissection including careful blood pressure control, avoidance of contact sports and heavy lifting, attention to lipid management.  We covered the importance of continued smoking cessation.  The patient does not yet meet surgical criteria of >5.5cm. The patient is aware of signs and symptoms of aortic dissection and when to present to the emergency department   -Follow up in 6 months with CTA of chest   Risk Modification:  Statin:  atorvastatin  10 mg  Smoking cessation instruction/counseling given:  commended patient for quitting and reviewed strategies for preventing relapses  Patient was counseled on importance of Blood Pressure Control  They are instructed to contact their Primary  Care Physician if they start to have blood pressure readings over 130s/90s. Do not ever stop blood pressure medications on your own, unless instructed by healthcare professional.  Please avoid use of Fluoroquinolones as this can potentially increase your risk of Aortic Rupture and/or Dissection  Patient educated on signs and symptoms of Aortic Dissection, handout also provided in AVS  Manuelita CHRISTELLA Rough, PA-C 01/16/24

## 2024-02-18 ENCOUNTER — Telehealth: Payer: Self-pay | Admitting: Internal Medicine

## 2024-02-18 MED ORDER — REPATHA SURECLICK 140 MG/ML ~~LOC~~ SOAJ: 140.0000 mg | SUBCUTANEOUS | 3 refills | Status: AC

## 2024-02-18 NOTE — Telephone Encounter (Signed)
*  STAT* If patient is at the pharmacy, call can be transferred to refill team.   1. Which medications need to be refilled? (please list name of each medication and dose if known)   Evolocumab  (REPATHA  SURECLICK) 140 MG/ML SOAJ   2. Would you like to learn more about the convenience, safety, & potential cost savings by using the [facility]?   3. Are you open to using the [facility] (Type [facility]. ).  4. Which pharmacy/location (including street and city if local pharmacy) is medication to be sent to?  [email or web address] [facility] - [place] - [address]   5. Do they need a 30 day or 90 day supply?   90 day  Patient stated he is completely out of this medication.

## 2024-02-28 DIAGNOSIS — L918 Other hypertrophic disorders of the skin: Secondary | ICD-10-CM | POA: Diagnosis not present

## 2024-03-25 NOTE — Addendum Note (Signed)
 Addended by: Pasqualino Witherspoon K on: 03/25/2024 08:03 AM   Modules accepted: Orders

## 2024-03-25 NOTE — Telephone Encounter (Signed)
 Called patient to remind them about follow-up lab work after initiating Repatha . Patient stated they plan to go either Thursday, October 2nd or Friday, October 3rd for labs.

## 2024-03-27 DIAGNOSIS — E785 Hyperlipidemia, unspecified: Secondary | ICD-10-CM | POA: Diagnosis not present

## 2024-03-28 LAB — LIPID PANEL
Chol/HDL Ratio: 2.1 ratio (ref 0.0–5.0)
Cholesterol, Total: 98 mg/dL — ABNORMAL LOW (ref 100–199)
HDL: 47 mg/dL (ref >39)
LDL Chol Calc (NIH): 32 mg/dL (ref 0–99)
Triglycerides: 101 mg/dL (ref 0–149)
VLDL Cholesterol Cal: 19 mg/dL (ref 5–40)

## 2024-03-30 ENCOUNTER — Ambulatory Visit: Payer: Self-pay | Admitting: Pharmacist

## 2024-03-30 DIAGNOSIS — N200 Calculus of kidney: Secondary | ICD-10-CM | POA: Diagnosis not present

## 2024-03-30 DIAGNOSIS — R1011 Right upper quadrant pain: Secondary | ICD-10-CM | POA: Diagnosis not present

## 2024-03-30 NOTE — Telephone Encounter (Signed)
 Lab result discussed over the phone. LDLc at goal tolerates Lipitor 10 mg 3 times per week well along with Repatha  140 mg every 14 days.

## 2024-03-31 DIAGNOSIS — L918 Other hypertrophic disorders of the skin: Secondary | ICD-10-CM | POA: Diagnosis not present

## 2024-04-08 DIAGNOSIS — N200 Calculus of kidney: Secondary | ICD-10-CM | POA: Diagnosis not present

## 2024-05-01 DIAGNOSIS — L918 Other hypertrophic disorders of the skin: Secondary | ICD-10-CM | POA: Diagnosis not present

## 2024-05-26 DIAGNOSIS — L918 Other hypertrophic disorders of the skin: Secondary | ICD-10-CM | POA: Diagnosis not present

## 2024-06-03 ENCOUNTER — Other Ambulatory Visit: Payer: Self-pay | Admitting: Thoracic Surgery (Cardiothoracic Vascular Surgery)

## 2024-06-03 DIAGNOSIS — I7121 Aneurysm of the ascending aorta, without rupture: Secondary | ICD-10-CM

## 2024-07-04 ENCOUNTER — Other Ambulatory Visit: Payer: Self-pay | Admitting: Internal Medicine

## 2024-07-06 ENCOUNTER — Ambulatory Visit (HOSPITAL_COMMUNITY)
Admission: RE | Admit: 2024-07-06 | Discharge: 2024-07-06 | Disposition: A | Discharge status: Home / Self Care | Source: Ambulatory Visit | Attending: Cardiology | Admitting: Cardiology

## 2024-07-06 DIAGNOSIS — I7121 Aneurysm of the ascending aorta, without rupture: Secondary | ICD-10-CM | POA: Diagnosis present

## 2024-07-06 MED ORDER — IOHEXOL 350 MG/ML SOLN
75.0000 mL | Freq: Once | INTRAVENOUS | Status: AC | PRN
  Administered 2024-07-06: 75 mL via INTRAVENOUS

## 2024-07-16 ENCOUNTER — Ambulatory Visit: Admitting: Family Medicine

## 2024-07-16 VITALS — BP 110/74 | HR 74 | Temp 97.9°F | Ht 72.25 in | Wt 238.1 lb

## 2024-07-16 DIAGNOSIS — R051 Acute cough: Secondary | ICD-10-CM | POA: Diagnosis not present

## 2024-07-16 DIAGNOSIS — B349 Viral infection, unspecified: Secondary | ICD-10-CM | POA: Diagnosis not present

## 2024-07-16 LAB — POC INFLUENZA A&B (BINAX/QUICKVUE)
Influenza A, POC: NEGATIVE
Influenza B, POC: NEGATIVE

## 2024-07-16 LAB — POC COVID19 BINAXNOW: SARS Coronavirus 2 Ag: NEGATIVE

## 2024-07-16 MED ORDER — DOXYCYCLINE HYCLATE 100 MG PO TABS: 100.0000 mg | ORAL_TABLET | Freq: Two times a day (BID) | ORAL | 0 refills | Status: AC

## 2024-07-16 NOTE — Progress Notes (Signed)
 "    Danyiel Crespin T. Mairyn Lenahan, MD, CAQ [facility], [facility] [address] [name], [address]  Phone: [phone]  FAX: [phone]  Daleon Willinger Steinmiller - 64 y.o. male  MRN [id number]  Date of Birth: 09/30/1960  Date: 07/16/2024  PCP: Cleatus Arlyss RAMAN, MD  Referral: Cleatus Arlyss RAMAN, MD  Chief Complaint  Patient presents with   Cough    Started on Tuesday   Nasal Congestion   Headache   Subjective:   Homar Weinkauf Arvanitis is a 64 y.o. very pleasant male patient with Body mass index is 32.07 kg/m. who presents with the following:  Discussed the use of AI scribe software for clinical note transcription with the patient, who gave verbal consent to proceed.  Hear murmur   History of Present Illness Erica Osuna is a 64 year old male with emphysema who presents with a persistent cough and throat irritation.  He has been experiencing a persistent, dry, hacky cough and constant throat itching. The cough is difficult to stop once it starts. No fever, but he notes some body aches. Sinus issues began late last night and today, with no significant nasal discharge prior. He has been coughing throughout the night, describing it as a dry cough with a continuous itch in the throat.  He reports that his wife has been sick with similar symptoms, and he may have been exposed to her illness. He quit smoking nearly twenty years ago and has a history of emphysema, though he does not use inhalers.  Recent CT chest does show emphysema.  He experiences ringing in his ears, which has been more pronounced lately, and reports transient dizziness lasting about ten seconds when lying down, occurring for about a month. He recalls having similar dizziness in the past, which usually resolved on its own.  He has a known heart murmur and an upper aortic aneurysm, both of which are longstanding conditions. He is allergic to penicillin and  erythromycin.    Review of Systems is noted in the HPI, as appropriate  Objective:   BP 110/74   Pulse 74   Temp 97.9 F (36.6 C) (Temporal)   Ht 6' 0.25 (1.835 m)   Wt 238 lb 2 oz (108 kg)   SpO2 98%   BMI 32.07 kg/m    Gen: WDWN, NAD. Globally Non-toxic HEENT: Throat clear, w/o exudate, R TM clear, L TM - good landmarks, No fluid present. rhinnorhea.  MMM Frontal sinuses: NT Max sinuses: mild ttp NECK: Anterior cervical  LAD is absent CV: RRR, No M/G/R, cap refill <2 sec PULM: Breathing comfortably in no respiratory distress. no wheezing, crackles.  Scattered rhonchi  Laboratory and Imaging Data: Results for orders placed or performed in visit on 07/16/24  POC Influenza A&B (Binax test)   Collection Time: 07/16/24  2:43 PM  Result Value Ref Range   Influenza A, POC Negative Negative   Influenza B, POC Negative Negative  POC COVID-19   Collection Time: 07/16/24  2:43 PM  Result Value Ref Range   SARS Coronavirus 2 Ag Negative Negative     Assessment and Plan:     ICD-10-CM   1. Viral syndrome  B34.9     2. Acute cough  R05.1 POC Influenza A&B (Binax test)    POC COVID-19     Assessment & Plan Acute upper respiratory infection Dry cough, throat itching, mild body aches. Negative COVID-19 and influenza. Likely viral, possibly cold or early  sinus infection. Allergic to penicillin and erythromycin. - Advised increased fluid intake. - Recommended OTC DayQuil or NyQuil for congestion. - Prescribed antibiotics for potential use if symptoms worsen or sinus pressure develops -Will go ahead and prescribe given the upcoming snow and ice. - Discussed low contagion risk, advised caution with close contact.  Medication Management during today's office visit: Meds ordered this encounter  Medications   doxycycline  (VIBRA -TABS) 100 MG tablet    Sig: Take 1 tablet (100 mg total) by mouth 2 (two) times daily.    Dispense:  20 tablet    Refill:  0   There are no  discontinued medications.  Orders placed today for conditions managed today: Orders Placed This Encounter  Procedures   POC Influenza A&B (Binax test)   POC COVID-19    Disposition: No follow-ups on file.  [employer] speech-to-text software was used for transcription in this dictation.  Possible transcriptional errors can occur using Animal nutritionist.   Signed,  Jacques DASEN. Millee Denise, MD   Outpatient Encounter Medications as of 07/16/2024  Medication Sig   acetaminophen  (TYLENOL ) 500 MG tablet Take 500 mg by mouth every 6 (six) hours as needed.   aspirin 81 MG tablet Take 81 mg by mouth daily.   atorvastatin  (LIPITOR) 10 MG tablet Take 1 tablet by mouth three times weekly.   doxycycline  (VIBRA -TABS) 100 MG tablet Take 1 tablet (100 mg total) by mouth 2 (two) times daily.   Evolocumab  (REPATHA  SURECLICK) 140 MG/ML SOAJ Inject 140 mg into the skin every 14 (fourteen) days.   fexofenadine  (ALLEGRA  ALLERGY) 180 MG tablet Take 1 tablet (180 mg total) by mouth daily.   metoprolol  succinate (TOPROL -XL) 25 MG 24 hr tablet TAKE 1 TABLET (25 MG TOTAL) BY MOUTH DAILY.   sildenafil  (REVATIO ) 20 MG tablet Take 3-5 tablets (60-100 mg total) by mouth daily as needed.   No facility-administered encounter medications on file as of 07/16/2024.   "

## 2024-07-21 ENCOUNTER — Ambulatory Visit

## 2024-07-21 NOTE — Progress Notes (Unsigned)
 "  Cardiology Office Note:   Date:  07/21/2024  ID:  Steven [name], DOB 13-Sep-1960, MRN [id number] PCP:  Steven Arlyss RAMAN, MD  [facility] Providers Cardiologist:  Wendel Haws, MD Referring MD: Steven Arlyss RAMAN, MD  Chief Complaint/Reason for Referral: Cardiology follow-up ASSESSMENT:    1. Coronary artery disease involving native coronary artery of native heart without angina pectoris   2. Aneurysm of ascending aorta without rupture   3. Hyperlipidemia LDL goal <70   4. Aortic atherosclerosis   5. BMI 32.0-32.9,adult      PLAN:   In order of problems listed above: Coronary artery disease: Mild on prior coronary CTA.  Continue aspirin 81 mg, atorvastatin  10 mg, Repatha  140 mg every 2 weeks Ascending aortic aneurysm: This is being followed by Dr. Kerrin with CT scans every 6 months.  Continue Toprol  25 mg Hyperlipidemia: LDL was at goal in October and was 32.  Continue atorvastatin  10 mg, Repatha  140 mg every 2 weeks Aortic atherosclerosis: Continue aspirin 81 mg, atorvastatin  10 mg, Repatha  140 mg every 2 weeks elevated BMI: Diet and exercise modification; hemoglobin A1c in 2025 was 5.7           Dispo:  No follow-ups on file.      Medication Adjustments/Labs and Tests Ordered: Current medicines are reviewed at length with the patient today.  Concerns regarding medicines are outlined above.  The following changes have been made:  no change   Labs/tests ordered: No orders of the defined types were placed in this encounter.   Medication Changes: No orders of the defined types were placed in this encounter.   Current medicines are reviewed at length with the patient today.  The patient does not have concerns regarding medicines.  I spent *** minutes reviewing all clinical data during and prior to this visit including all relevant imaging studies, laboratories, clinical information from other health systems and prior notes from both Cardiology and other  specialties, interviewing the patient, conducting a complete physical examination, and coordinating care in order to formulate a comprehensive and personalized evaluation and treatment plan.   History of Present Illness:      FOCUSED PROBLEM LIST:   Ascending aortic aneurysm Monitored by CT surgery Aortic insufficiency Mild to moderate; EF 65 to 70% TTE January 2024 CAD Mild; coronary CTA 2021 Hyperlipidemia LP(a) less than 8.4 Intolerant of Crestor and atorvastatin  On Nexlizet  Aortic atherosclerosis CT abdomen pelvis 2024 BMI 32  1/25:  The patient returns for cardiology follow-up.  He was last seen about 6 months ago by Dr. Dann.  At that time he was doing well no changes were made to his medical regimen.  He was seen by Dr. Kerrin.  His aneurysm was stable at around 4.7 to 4.8 cm in he was recommended CT scanning every 6 months.  The patient is doing very well.  He had issues with a lot of GI intolerances but he has modified his diet to help with this.  He is no longer on any statins and this has helped his dietary issues.  He is lost about 20 pounds from changing his diet.  He is exercising on a regular basis.  He denies any exertional angina or severe exertional dyspnea.  He occasionally gets palpitations maybe once a week but this does not happen on a couple weeks.  They last maybe 10 seconds.  He denies any presyncope or syncope.  He is required no emergency room visits or hospitalizations recently.  Plan:  Check lipid panel and LP(a).  February 2026:  Patient consents to use of AI scribe. In the interim the patient's LDL was 32 in October.        Current Medications: No outpatient medications have been marked as taking for the 07/27/24 encounter (Appointment) with Mahika Vanvoorhis K, MD.     Review of Systems:   Please see the history of present illness.    All other systems reviewed and are negative.     EKGs/Labs/Other Test Reviewed:   EKG: EKG done July 2024  demonstrates sinus tachycardia  EKG Interpretation Date/Time:    Ventricular Rate:    PR Interval:    QRS Duration:    QT Interval:    QTC Calculation:   R Axis:      Text Interpretation:           Risk Assessment/Calculations:          Physical Exam:   VS:  There were no vitals taken for this visit.   No BP recorded.  {Refresh Note OR Click here to enter BP  :1}***   Wt Readings from Last 3 Encounters:  07/16/24 238 lb 2 oz (108 kg)  01/16/24 223 lb (101.2 kg)  12/31/23 223 lb (101.2 kg)      GENERAL:  No apparent distress, AOx3 HEENT:  No carotid bruits, +2 carotid impulses, no scleral icterus CAR: RRR no murmurs, gallops, rubs, or thrills RES:  Clear to auscultation bilaterally ABD:  Soft, nontender, nondistended, positive bowel sounds x 4 VASC:  +2 radial pulses, +2 carotid pulses NEURO:  CN 2-12 grossly intact; motor and sensory grossly intact PSYCH:  No active depression or anxiety EXT:  No edema, ecchymosis, or cyanosis  Signed, Bailey Faiella K Kinston Magnan, MD  07/21/2024 8:42 AM    [facility] [address] [place], [place], [place]  [address] Phone: [phone]; Fax: [phone]   Note:  This document was prepared using Dragon voice recognition software and may include unintentional dictation errors. "

## 2024-07-27 ENCOUNTER — Ambulatory Visit: Admitting: Internal Medicine

## 2024-07-27 ENCOUNTER — Encounter: Payer: Self-pay | Admitting: Internal Medicine

## 2024-07-27 VITALS — BP 124/66 | HR 68 | Ht 74.0 in | Wt 238.0 lb

## 2024-07-27 DIAGNOSIS — R42 Dizziness and giddiness: Secondary | ICD-10-CM | POA: Diagnosis not present

## 2024-07-27 DIAGNOSIS — I7121 Aneurysm of the ascending aorta, without rupture: Secondary | ICD-10-CM | POA: Diagnosis not present

## 2024-07-27 DIAGNOSIS — I700 Atherosclerosis of aorta: Secondary | ICD-10-CM

## 2024-07-27 DIAGNOSIS — E785 Hyperlipidemia, unspecified: Secondary | ICD-10-CM

## 2024-07-27 DIAGNOSIS — I2510 Atherosclerotic heart disease of native coronary artery without angina pectoris: Secondary | ICD-10-CM

## 2024-07-27 DIAGNOSIS — R011 Cardiac murmur, unspecified: Secondary | ICD-10-CM | POA: Diagnosis not present

## 2024-07-27 DIAGNOSIS — Z6832 Body mass index (BMI) 32.0-32.9, adult: Secondary | ICD-10-CM

## 2024-07-27 NOTE — Patient Instructions (Signed)
 Medication Instructions:  Your physician recommends that you continue on your current medications as directed. Please refer to the Current Medication list given to you today.  *If you need a refill on your cardiac medications before your next appointment, please call your pharmacy*  Lab Work: None  If you have labs (blood work) drawn today and your tests are completely normal, you will receive your results only by: MyChart Message (if you have MyChart) OR A paper copy in the mail If you have any lab test that is abnormal or we need to change your treatment, we will call you to review the results.  Testing/Procedures: Echocardiogram Your physician has requested that you have an echocardiogram. Echocardiography is a painless test that uses sound waves to create images of your heart. It provides your doctor with information about the size and shape of your heart and how well your hearts chambers and valves are working. This procedure takes approximately one hour. There are no restrictions for this procedure. Please do NOT wear cologne, perfume, aftershave, or lotions (deodorant is allowed). Please arrive 15 minutes prior to your appointment time.  Please note: We ask at that you not bring children with you during ultrasound (echo/ vascular) testing. Due to room size and safety concerns, children are not allowed in the ultrasound rooms during exams. Our front office staff cannot provide observation of children in our lobby area while testing is being conducted. An adult accompanying a patient to their appointment will only be allowed in the ultrasound room at the discretion of the ultrasound technician under special circumstances. We apologize for any inconvenience.   Follow-Up: At [facility], you and your health needs are our priority.  As part of our continuing mission to provide you with exceptional heart care, our providers are all part of one team.  This team includes your primary  Cardiologist (physician) and Advanced Practice Providers or APPs (Physician Assistants and Nurse Practitioners) who all work together to provide you with the care you need, when you need it.  Your next appointment:   1 year(s)  Provider:   Lurena MARLA Red, MD     Other Instructions None

## 2024-07-28 ENCOUNTER — Ambulatory Visit

## 2024-07-28 VITALS — BP 136/78 | HR 79 | Resp 18 | Ht 74.0 in | Wt 241.0 lb

## 2024-07-28 DIAGNOSIS — I7121 Aneurysm of the ascending aorta, without rupture: Secondary | ICD-10-CM | POA: Diagnosis not present

## 2024-07-28 NOTE — Patient Instructions (Signed)

## 2024-08-17 ENCOUNTER — Ambulatory Visit (HOSPITAL_COMMUNITY)
